# Patient Record
Sex: Female | Born: 1970 | Race: White | Hispanic: No | Smoking: Never smoker
Health system: Southern US, Community
[De-identification: ages and names within clinical notes are randomized; demographics above are authoritative.]

## PROBLEM LIST (undated history)

## (undated) DIAGNOSIS — M5136 Other intervertebral disc degeneration, lumbar region: Secondary | ICD-10-CM

## (undated) DIAGNOSIS — M48061 Spinal stenosis, lumbar region without neurogenic claudication: Secondary | ICD-10-CM

## (undated) DIAGNOSIS — F419 Anxiety disorder, unspecified: Secondary | ICD-10-CM

## (undated) DIAGNOSIS — K2270 Barrett's esophagus without dysplasia: Secondary | ICD-10-CM

## (undated) DIAGNOSIS — M51369 Other intervertebral disc degeneration, lumbar region without mention of lumbar back pain or lower extremity pain: Secondary | ICD-10-CM

## (undated) DIAGNOSIS — Z8489 Family history of other specified conditions: Secondary | ICD-10-CM

## (undated) DIAGNOSIS — I10 Essential (primary) hypertension: Secondary | ICD-10-CM

## (undated) DIAGNOSIS — J45909 Unspecified asthma, uncomplicated: Secondary | ICD-10-CM

## (undated) DIAGNOSIS — K219 Gastro-esophageal reflux disease without esophagitis: Secondary | ICD-10-CM

## (undated) HISTORY — DX: Barrett's esophagus without dysplasia: K22.70

## (undated) HISTORY — DX: Anxiety disorder, unspecified: F41.9

## (undated) HISTORY — DX: Unspecified asthma, uncomplicated: J45.909

## (undated) HISTORY — DX: Essential (primary) hypertension: I10

---
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
## 2005-07-14 ENCOUNTER — Other Ambulatory Visit: Payer: Self-pay

## 2005-07-14 ENCOUNTER — Emergency Department: Payer: Self-pay | Admitting: Emergency Medicine

## 2005-11-08 HISTORY — PX: DILATION AND CURETTAGE OF UTERUS: SHX78

## 2005-12-13 ENCOUNTER — Ambulatory Visit: Payer: Self-pay | Admitting: Obstetrics and Gynecology

## 2008-12-04 ENCOUNTER — Ambulatory Visit: Payer: Self-pay | Admitting: Internal Medicine

## 2009-03-18 ENCOUNTER — Ambulatory Visit: Payer: Self-pay | Admitting: Obstetrics and Gynecology

## 2009-08-22 ENCOUNTER — Ambulatory Visit: Payer: Self-pay | Admitting: Gastroenterology

## 2010-04-21 ENCOUNTER — Ambulatory Visit: Payer: Self-pay | Admitting: Obstetrics and Gynecology

## 2011-09-14 ENCOUNTER — Ambulatory Visit: Payer: Self-pay | Admitting: Obstetrics and Gynecology

## 2012-11-28 ENCOUNTER — Ambulatory Visit: Payer: Self-pay | Admitting: Obstetrics and Gynecology

## 2014-02-14 DIAGNOSIS — E785 Hyperlipidemia, unspecified: Secondary | ICD-10-CM | POA: Insufficient documentation

## 2014-02-14 DIAGNOSIS — K219 Gastro-esophageal reflux disease without esophagitis: Secondary | ICD-10-CM | POA: Insufficient documentation

## 2014-02-14 DIAGNOSIS — I10 Essential (primary) hypertension: Secondary | ICD-10-CM | POA: Insufficient documentation

## 2014-03-05 ENCOUNTER — Ambulatory Visit: Payer: Self-pay | Admitting: Obstetrics and Gynecology

## 2015-06-05 DIAGNOSIS — M6530 Trigger finger, unspecified finger: Secondary | ICD-10-CM | POA: Insufficient documentation

## 2015-06-05 DIAGNOSIS — M7710 Lateral epicondylitis, unspecified elbow: Secondary | ICD-10-CM | POA: Insufficient documentation

## 2016-05-25 ENCOUNTER — Other Ambulatory Visit: Payer: Self-pay | Admitting: Internal Medicine

## 2016-05-25 DIAGNOSIS — Z1231 Encounter for screening mammogram for malignant neoplasm of breast: Secondary | ICD-10-CM

## 2016-06-11 ENCOUNTER — Ambulatory Visit: Payer: Self-pay

## 2016-06-16 ENCOUNTER — Ambulatory Visit
Admission: RE | Admit: 2016-06-16 | Discharge: 2016-06-16 | Disposition: A | Discharge status: Home / Self Care | Payer: BLUE CROSS/BLUE SHIELD | Source: Ambulatory Visit | Attending: Internal Medicine | Admitting: Internal Medicine

## 2016-06-16 ENCOUNTER — Other Ambulatory Visit: Payer: Self-pay | Admitting: Internal Medicine

## 2016-06-16 DIAGNOSIS — Z1231 Encounter for screening mammogram for malignant neoplasm of breast: Secondary | ICD-10-CM

## 2016-06-16 DIAGNOSIS — R928 Other abnormal and inconclusive findings on diagnostic imaging of breast: Secondary | ICD-10-CM | POA: Diagnosis not present

## 2016-06-22 ENCOUNTER — Other Ambulatory Visit: Payer: Self-pay | Admitting: Internal Medicine

## 2016-06-22 DIAGNOSIS — N6320 Unspecified lump in the left breast, unspecified quadrant: Secondary | ICD-10-CM

## 2016-06-25 ENCOUNTER — Ambulatory Visit
Admission: RE | Admit: 2016-06-25 | Discharge: 2016-06-25 | Disposition: A | Discharge status: Home / Self Care | Payer: BLUE CROSS/BLUE SHIELD | Source: Ambulatory Visit | Attending: Internal Medicine | Admitting: Internal Medicine

## 2016-06-25 DIAGNOSIS — N6320 Unspecified lump in the left breast, unspecified quadrant: Secondary | ICD-10-CM

## 2016-06-25 DIAGNOSIS — N63 Unspecified lump in breast: Secondary | ICD-10-CM | POA: Insufficient documentation

## 2016-09-23 ENCOUNTER — Ambulatory Visit
Admission: RE | Admit: 2016-09-23 | Payer: BLUE CROSS/BLUE SHIELD | Source: Ambulatory Visit | Admitting: Gastroenterology

## 2016-09-23 ENCOUNTER — Encounter: Admission: RE | Payer: Self-pay | Source: Ambulatory Visit

## 2016-09-23 SURGERY — ESOPHAGOGASTRODUODENOSCOPY (EGD) WITH PROPOFOL
Anesthesia: General

## 2016-12-15 ENCOUNTER — Other Ambulatory Visit: Payer: Self-pay | Admitting: Internal Medicine

## 2016-12-15 DIAGNOSIS — N63 Unspecified lump in unspecified breast: Secondary | ICD-10-CM

## 2017-01-04 ENCOUNTER — Ambulatory Visit
Admission: RE | Admit: 2017-01-04 | Discharge: 2017-01-04 | Disposition: A | Discharge status: Home / Self Care | Payer: Commercial Managed Care - PPO | Source: Ambulatory Visit | Attending: Internal Medicine | Admitting: Internal Medicine

## 2017-01-04 DIAGNOSIS — N6320 Unspecified lump in the left breast, unspecified quadrant: Secondary | ICD-10-CM | POA: Insufficient documentation

## 2017-01-04 DIAGNOSIS — N63 Unspecified lump in unspecified breast: Secondary | ICD-10-CM

## 2017-01-11 ENCOUNTER — Ambulatory Visit: Payer: BLUE CROSS/BLUE SHIELD

## 2017-08-02 ENCOUNTER — Other Ambulatory Visit: Payer: Self-pay | Admitting: Nurse Practitioner

## 2017-08-02 DIAGNOSIS — N63 Unspecified lump in unspecified breast: Secondary | ICD-10-CM

## 2017-09-02 ENCOUNTER — Ambulatory Visit
Admission: RE | Admit: 2017-09-02 | Discharge: 2017-09-02 | Disposition: A | Discharge status: Home / Self Care | Payer: Commercial Managed Care - PPO | Source: Ambulatory Visit | Attending: Nurse Practitioner | Admitting: Nurse Practitioner

## 2017-09-02 DIAGNOSIS — N63 Unspecified lump in unspecified breast: Secondary | ICD-10-CM

## 2017-09-02 DIAGNOSIS — N6002 Solitary cyst of left breast: Secondary | ICD-10-CM | POA: Insufficient documentation

## 2017-09-02 DIAGNOSIS — N6320 Unspecified lump in the left breast, unspecified quadrant: Secondary | ICD-10-CM | POA: Diagnosis present

## 2017-10-24 ENCOUNTER — Other Ambulatory Visit: Payer: Self-pay

## 2017-10-26 ENCOUNTER — Other Ambulatory Visit: Payer: Self-pay | Admitting: Nurse Practitioner

## 2017-10-26 MED ORDER — NORETHINDRONE 0.35 MG PO TABS: 1.0000 | ORAL_TABLET | Freq: Every day | ORAL | 3 refills | Status: DC

## 2017-12-12 ENCOUNTER — Ambulatory Visit: Payer: Self-pay | Admitting: Nurse Practitioner

## 2017-12-28 ENCOUNTER — Encounter: Payer: Self-pay | Admitting: Internal Medicine

## 2017-12-28 ENCOUNTER — Ambulatory Visit: Payer: Commercial Managed Care - PPO | Admitting: Internal Medicine

## 2017-12-28 VITALS — BP 168/92 | HR 97 | Resp 16 | Ht 66.0 in | Wt 160.6 lb

## 2017-12-28 DIAGNOSIS — Z0001 Encounter for general adult medical examination with abnormal findings: Secondary | ICD-10-CM | POA: Diagnosis not present

## 2017-12-28 DIAGNOSIS — G4700 Insomnia, unspecified: Secondary | ICD-10-CM

## 2017-12-28 DIAGNOSIS — F439 Reaction to severe stress, unspecified: Secondary | ICD-10-CM

## 2017-12-28 DIAGNOSIS — I10 Essential (primary) hypertension: Secondary | ICD-10-CM

## 2017-12-28 MED ORDER — ALPRAZOLAM 0.25 MG PO TABS: 0.2500 mg | ORAL_TABLET | Freq: Two times a day (BID) | ORAL | 2 refills | Status: DC | PRN

## 2017-12-28 NOTE — Progress Notes (Signed)
[facility] [address] [place], [place] [address]  Internal MEDICINE  Office Visit Note  Patient Name: Pam Khan  [id number]  [id number]  Date of Service: 01/16/2018  Chief Complaint  Patient presents with  . Follow-up    6 month medications    HPI  Pt is here for routine follow up. Pt is here emotional and crying. She is getting separation from her husband. BP is elevated    Current Medication: Outpatient Encounter Medications as of 12/28/2017  Medication Sig  . diltiazem (CARDIZEM CD) 120 MG 24 hr capsule TAKE ONE CAPSULE BY MOUTH AT BEDTIME FOR HEART RATE  . famciclovir (FAMVIR) 500 MG tablet famciclovir 500 mg tablet  . fluconazole (DIFLUCAN) 150 MG tablet fluconazole 150 mg tablet  . norethindrone (ERRIN) 0.35 MG tablet Take 1 tablet (0.35 mg total) by mouth daily.  Marland Kitchen omeprazole (PRILOSEC) 40 MG capsule omeprazole 40 mg capsule,delayed release  . triamterene-hydrochlorothiazide (MAXZIDE-25) 37.5-25 MG tablet triamterene 37.5 mg-hydrochlorothiazide 25 mg tablet  . ALPRAZolam (XANAX) 0.25 MG tablet Take 1 tablet (0.25 mg total) by mouth 2 (two) times daily as needed for anxiety.  . [DISCONTINUED] norethindrone (MICRONOR,CAMILA,ERRIN) 0.35 MG tablet TAKE ONE TABLET BY MOUTH ONE TIME DAILY   No facility-administered encounter medications on file as of 12/28/2017.     Surgical History: Past Surgical History:  Procedure Laterality Date  . BACK SURGERY  1985  . CESAREAN SECTION  1989  . COSMETIC SURGERY  1985   head  . DILATION AND CURETTAGE OF UTERUS  2007    Medical History: Past Medical History:  Diagnosis Date  . Anxiety   . Asthma   . Barrett esophagus   . Hypertension     Family History: Family History  Problem Relation Age of Onset  . Breast cancer Mother 59  . Breast cancer Maternal Grandmother 60  . Diabetes Father   . Heart attack Father     Social History   Socioeconomic History  . Marital status: Married    Spouse name:  Not on file  . Number of children: Not on file  . Years of education: Not on file  . Highest education level: Not on file  Social Needs  . Financial resource strain: Not on file  . Food insecurity - worry: Not on file  . Food insecurity - inability: Not on file  . Transportation needs - medical: Not on file  . Transportation needs - non-medical: Not on file  Occupational History  . Not on file  Tobacco Use  . Smoking status: Never Smoker  . Smokeless tobacco: Never Used  Substance and Sexual Activity  . Alcohol use: No    Frequency: Never  . Drug use: No  . Sexual activity: Not on file  Other Topics Concern  . Not on file  Social History Narrative  . Not on file    Review of Systems  Constitutional: Negative for chills, fatigue and unexpected weight change.  HENT: Positive for postnasal drip. Negative for congestion, rhinorrhea, sneezing and sore throat.   Eyes: Negative for redness.  Respiratory: Negative for cough, chest tightness and shortness of breath.   Cardiovascular: Negative for chest pain and palpitations.  Gastrointestinal: Negative for abdominal pain, constipation, diarrhea, nausea and vomiting.  Genitourinary: Negative for dysuria and frequency.  Musculoskeletal: Negative for arthralgias, back pain, joint swelling and neck pain.  Skin: Negative for rash.  Neurological: Negative.  Negative for tremors and numbness.  Hematological: Negative for adenopathy. Does not  bruise/bleed easily.  Psychiatric/Behavioral: Negative for behavioral problems (Depression), sleep disturbance and suicidal ideas. The patient is not nervous/anxious.    Vital Signs: BP (!) 168/92 (BP Location: Left Arm, Patient Position: Sitting)   Pulse 97   Resp 16   Ht 5\' 6"  (1.676 m)   Wt 160 lb 9.6 oz (72.8 kg)   SpO2 98%   BMI 25.92 kg/m   Physical Exam  Constitutional: She is oriented to person, place, and time. She appears well-developed and well-nourished. No distress.  HENT:  Head:  Normocephalic and atraumatic.  Mouth/Throat: Oropharynx is clear and moist. No oropharyngeal exudate.  Eyes: EOM are normal. Pupils are equal, round, and reactive to light.  Neck: Normal range of motion. Neck supple. No tracheal deviation present.  Cardiovascular: Normal rate, regular rhythm and normal heart sounds. Exam reveals no gallop.  No murmur heard. Pulmonary/Chest: Effort normal. She has no rales. She exhibits no tenderness.  Abdominal: Soft. Bowel sounds are normal.  Musculoskeletal: Normal range of motion.  Lymphadenopathy:    She has cervical adenopathy.  Neurological: She is alert and oriented to person, place, and time.  Skin: Skin is warm and dry. She is not diaphoretic.  Psychiatric: Her behavior is normal. Judgment and thought content normal.  Anxious Gayla Medicus/stressed     Assessment/Plan: 1. Hypertension, unspecified type Monitor at home, continue medications  2. Stress at home - Emotional support is provided   3. Insomnia, unspecified type - Prn use of Xanax  General Counseling: Pam SpryGail verbalizes understanding of the findings of todays visit and agrees with plan of treatment. I have discussed any further diagnostic evaluation that may be needed or ordered today. We also reviewed her medications today. she has been encouraged to call the office with any questions or concerns that should arise related to todays visit.    Meds ordered this encounter  Medications  . ALPRAZolam (XANAX) 0.25 MG tablet    Sig: Take 1 tablet (0.25 mg total) by mouth 2 (two) times daily as needed for anxiety.    Dispense:  60 tablet    Refill:  2    Time spent:25 Minutes   Dr Lyndon CodeFozia M Khan Internal medicine

## 2018-01-05 ENCOUNTER — Other Ambulatory Visit: Payer: Self-pay | Admitting: Internal Medicine

## 2018-02-06 ENCOUNTER — Other Ambulatory Visit: Payer: Self-pay | Admitting: Internal Medicine

## 2018-04-05 ENCOUNTER — Other Ambulatory Visit: Payer: Self-pay | Admitting: Internal Medicine

## 2018-05-23 ENCOUNTER — Other Ambulatory Visit: Payer: Self-pay

## 2018-05-23 MED ORDER — NORETHINDRONE 0.35 MG PO TABS: 1.0000 | ORAL_TABLET | Freq: Every day | ORAL | 3 refills | Status: DC

## 2018-06-28 ENCOUNTER — Other Ambulatory Visit: Payer: Self-pay | Admitting: Adult Health

## 2018-06-28 ENCOUNTER — Encounter: Payer: Self-pay | Admitting: Adult Health

## 2018-06-28 ENCOUNTER — Ambulatory Visit: Payer: Commercial Managed Care - PPO | Admitting: Adult Health

## 2018-06-28 VITALS — BP 146/76 | HR 90 | Resp 16 | Ht 66.0 in | Wt 163.0 lb

## 2018-06-28 DIAGNOSIS — Z124 Encounter for screening for malignant neoplasm of cervix: Secondary | ICD-10-CM | POA: Diagnosis not present

## 2018-06-28 DIAGNOSIS — Z0001 Encounter for general adult medical examination with abnormal findings: Secondary | ICD-10-CM | POA: Diagnosis not present

## 2018-06-28 DIAGNOSIS — N760 Acute vaginitis: Secondary | ICD-10-CM

## 2018-06-28 DIAGNOSIS — K219 Gastro-esophageal reflux disease without esophagitis: Secondary | ICD-10-CM

## 2018-06-28 DIAGNOSIS — F439 Reaction to severe stress, unspecified: Secondary | ICD-10-CM

## 2018-06-28 DIAGNOSIS — R300 Dysuria: Secondary | ICD-10-CM

## 2018-06-28 DIAGNOSIS — I10 Essential (primary) hypertension: Secondary | ICD-10-CM | POA: Diagnosis not present

## 2018-06-28 MED ORDER — ALPRAZOLAM 0.25 MG PO TABS: 0.2500 mg | ORAL_TABLET | Freq: Two times a day (BID) | ORAL | 1 refills | Status: DC | PRN

## 2018-06-28 NOTE — Progress Notes (Signed)
[facility] [address] [place], [place] [address]  Internal MEDICINE  Office Visit Note  Patient Name: Pam Khan  [id number]  [id number]  Date of Service: 07/16/2018  Chief Complaint  Patient presents with  . Annual Exam  . Hypertension  . Insomnia     HPI Pt is here for routine health maintenance examination.  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 She continues to be dealing with a bad divorce.  She denies depression, but reports increased stress with this lingering divorce.  She is generally healthy, denies complaints.  She is eating well  Current Medication: Outpatient Encounter Medications as of 06/28/2018  Medication Sig  . ALPRAZolam (XANAX) 0.25 MG tablet Take 1 tablet (0.25 mg total) by mouth 2 (two) times daily as needed for anxiety.  Marland Kitchen diltiazem (CARDIZEM CD) 120 MG 24 hr capsule TAKE ONE CAPSULE BY MOUTH AT BEDTIME FOR HEART RATE  . famciclovir (FAMVIR) 500 MG tablet famciclovir 500 mg tablet  . fluconazole (DIFLUCAN) 150 MG tablet fluconazole 150 mg tablet  . norethindrone (MICRONOR,CAMILA,ERRIN) 0.35 MG tablet Take 1 tablet (0.35 mg total) by mouth daily.  Marland Kitchen omeprazole (PRILOSEC) 40 MG capsule omeprazole 40 mg capsule,delayed release  . triamterene-hydrochlorothiazide (MAXZIDE-25) 37.5-25 MG tablet triamterene 37.5 mg-hydrochlorothiazide 25 mg tablet  . [DISCONTINUED] ALPRAZolam (XANAX) 0.25 MG tablet Take 1 tablet (0.25 mg total) by mouth 2 (two) times daily as needed for anxiety.   No facility-administered encounter medications on file as of 06/28/2018.     Surgical History: Past Surgical History:  Procedure Laterality Date  . BACK SURGERY  1985  . CESAREAN SECTION  1989  . COSMETIC SURGERY  1985   head  . DILATION AND CURETTAGE OF UTERUS  2007    Medical History: Past Medical History:  Diagnosis Date  . Anxiety   . Asthma   . Barrett esophagus   . Hypertension     Family  History: Family History  Problem Relation Age of Onset  . Breast cancer Mother 11  . Breast cancer Maternal Grandmother 60  . Diabetes Father   . Heart attack Father       Review of Systems  Constitutional: Negative for chills, fatigue and unexpected weight change.  HENT: Negative for congestion, rhinorrhea, sneezing and sore throat.   Eyes: Negative for photophobia, pain and redness.  Respiratory: Negative for cough, chest tightness and shortness of breath.   Cardiovascular: Negative for chest pain and palpitations.  Gastrointestinal: Negative for abdominal pain, constipation, diarrhea, nausea and vomiting.  Endocrine: Negative.   Genitourinary: Negative for dysuria and frequency.  Musculoskeletal: Negative for arthralgias, back pain, joint swelling and neck pain.  Skin: Negative for rash.  Allergic/Immunologic: Negative.   Neurological: Negative for tremors and numbness.  Hematological: Negative for adenopathy. Does not bruise/bleed easily.  Psychiatric/Behavioral: Negative for behavioral problems and sleep disturbance. The patient is not nervous/anxious.      Vital Signs: BP (!) 146/76   Pulse 90   Resp 16   Ht 5\' 6"  (1.676 m)   Wt 163 lb (73.9 kg)   SpO2 98%   BMI 26.31 kg/m    Physical Exam  Constitutional: She is oriented to person, place, and time. She appears well-developed and well-nourished. No distress.  HENT:  Head: Normocephalic and atraumatic.  Mouth/Throat: Oropharynx is clear and moist. No oropharyngeal exudate.  Eyes: Pupils are equal, round, and reactive to light. EOM are normal.  Neck: Normal  range of motion. Neck supple. No JVD present. No tracheal deviation present. No thyromegaly present.  Cardiovascular: Normal rate, regular rhythm and normal heart sounds. Exam reveals no gallop and no friction rub.  No murmur heard. Pulmonary/Chest: Effort normal and breath sounds normal. No respiratory distress. She has no wheezes. She has no rales. She  exhibits no tenderness. Right breast exhibits no inverted nipple, no mass, no nipple discharge, no skin change and no tenderness. Left breast exhibits no inverted nipple, no mass, no nipple discharge, no skin change and no tenderness. No breast bleeding. Breasts are symmetrical.  Abdominal: Soft. There is no tenderness. There is no guarding.  Genitourinary: Uterus normal. Rectal exam shows no external hemorrhoid, no internal hemorrhoid and no mass. There is no rash, tenderness, lesion or injury on the right labia. There is no rash, tenderness, lesion or injury on the left labia. Cervix exhibits no discharge. No erythema, tenderness or bleeding in the vagina. No foreign body in the vagina. No signs of injury around the vagina. No vaginal discharge found.  Musculoskeletal: Normal range of motion.  Lymphadenopathy:    She has no cervical adenopathy.  Neurological: She is alert and oriented to person, place, and time. No cranial nerve deficit.  Skin: Skin is warm and dry. She is not diaphoretic.  Psychiatric: She has a normal mood and affect. Her behavior is normal. Judgment and thought content normal.  Nursing note and vitals reviewed.    LABS: Recent Results (from the past 2160 hour(s))  NuSwab Vaginitis Plus (VG+)     Status: Abnormal   Collection Time: 06/28/18 12:00 AM  Result Value Ref Range   Atopobium vaginae High - 2 (A) Score   BVAB 2 High - 2 (A) Score   Megasphaera 1 Low - 0 Score    Comment: Calculate total score by adding the 3 individual bacterial vaginosis (BV) marker scores together.  Total score is interpreted as follows: Total score 0-1: Indicates the absence of BV. Total score   2: Indeterminate for BV. Additional clinical                  data should be evaluated to establish a                  diagnosis. Total score 3-6: Indicates the presence of BV. This test was developed and its performance characteristics determined by [facility].  It has not been cleared or  approved by the Food and Drug Administration.  The FDA has determined that such clearance or approval is not necessary.    Candida albicans, NAA Positive (A) Negative   Candida glabrata, NAA Negative Negative    Comment: This test was developed and its performance characteristics determined by [facility].  It has not been cleared or approved by the Food and Drug Administration.  The FDA has determined that such clearance or approval is not necessary.    Trich vag by NAA Negative Negative   Chlamydia trachomatis, NAA Negative Negative   Neisseria gonorrhoeae, NAA Negative Negative  Specimen status report     Status: None   Collection Time: 06/28/18 12:00 AM  Result Value Ref Range   specimen status report Comment     Comment: Please note Please note The date and/or time of collection was not indicated on the requisition as required by state and federal law.  The date of receipt of the specimen was used as the collection date if not supplied.   UA/M w/rflx Culture, Routine  Status: Abnormal   Collection Time: 06/28/18 12:00 AM  Result Value Ref Range   Specific Gravity, UA 1.023 1.005 - 1.030   pH, UA 6.0 5.0 - 7.5   Color, UA Yellow Yellow   Appearance Ur Clear Clear   Leukocytes, UA Negative Negative   Protein, UA Negative Negative/Trace   Glucose, UA Negative Negative   Ketones, UA Negative Negative   RBC, UA Negative Negative   Bilirubin, UA Negative Negative   Urobilinogen, Ur 0.2 0.2 - 1.0 mg/dL   Nitrite, UA Positive (A) Negative   Microscopic Examination See below:     Comment: Microscopic was indicated and was performed.   Urinalysis Reflex Comment     Comment: This specimen has reflexed to a Urine Culture.  Microscopic Examination     Status: Abnormal   Collection Time: 06/28/18 12:00 AM  Result Value Ref Range   WBC, UA 0-5 0 - 5 /hpf   RBC, UA 0-2 0 - 2 /hpf   Epithelial Cells (non renal) 0-10 0 - 10 /hpf   Casts None seen None seen /lpf   Mucus, UA  Present Not Estab.   Bacteria, UA Moderate (A) None seen/Few  Urine Culture, Reflex     Status: Abnormal   Collection Time: 06/28/18 12:00 AM  Result Value Ref Range   Urine Culture, Routine Final report (A)    Organism ID, Bacteria Escherichia coli (A)     Comment: Greater than 100,000 colony forming units per mL Cefazolin <=4 ug/mL Cefazolin with an MIC <=16 predicts susceptibility to the oral agents cefaclor, cefdinir, cefpodoxime, cefprozil, cefuroxime, cephalexin, and loracarbef when used for therapy of uncomplicated urinary tract infections due to E. coli, Klebsiella pneumoniae, and Proteus mirabilis.    Antimicrobial Susceptibility Comment     Comment:       ** S = Susceptible; I = Intermediate; R = Resistant **                    P = Positive; N = Negative             MICS are expressed in micrograms per mL    Antibiotic                 RSLT#1    RSLT#2    RSLT#3    RSLT#4 Amoxicillin/Clavulanic Acid    S Ampicillin                     S Cefepime                       S Ceftriaxone                    S Cefuroxime                     S Ciprofloxacin                  S Ertapenem                      S Gentamicin                     S Imipenem                       S Levofloxacin  S Meropenem                      S Nitrofurantoin                 S Piperacillin/Tazobactam        S Tetracycline                   S Tobramycin                     S Trimethoprim/Sulfa             S   Pap IG and HPV (high risk) DNA detection     Status: None   Collection Time: 06/28/18 12:00 AM  Result Value Ref Range   DIAGNOSIS: Comment     Comment: NEGATIVE FOR INTRAEPITHELIAL LESION OR MALIGNANCY. CELLULAR CHANGES ASSOCIATED WITH INFLAMMATION ARE PRESENT. FUNGAL ORGANISMS MORPHOLOGICALLY CONSISTENT WITH CANDIDA SPECIES ARE PRESENT. THIS SPECIMEN WAS RESCREENED AS PART OF OUR QUALITY CONTROL PROGRAM.    Specimen adequacy: Comment     Comment: Satisfactory for  evaluation. Endocervical and/or squamous metaplastic cells (endocervical component) are present.    Clinician Provided ICD10 Comment     Comment: Z12.4   Performed by: Comment     Comment: Frances MaywoodPang Yang, Cytotechnologist (ASCP)   QC reviewed by: Comment     Comment: Camie PatienceAngela Scott, Supervisory Cytotechnologist (ASCP)   PAP Smear Comment .    Note: Comment     Comment: The Pap smear is a screening test designed to aid in the detection of premalignant and malignant conditions of the uterine cervix.  It is not a diagnostic procedure and should not be used as the sole means of detecting cervical cancer.  Both false-positive and false-negative reports do occur.    Test Methodology Comment     Comment: This liquid based ThinPrep(R) pap test was screened with the use of an image guided system.    HPV, high-risk Negative Negative    Comment: This high-risk HPV test detects thirteen high-risk types (16/18/31/33/35/39/45/51/52/56/58/59/68) without differentiation.      Assessment/Plan: 1. Encounter for general adult medical examination with abnormal findings Labs ordered. Will treat as indicated.   2. Routine cervical smear - Pap IG and HPV (high risk) DNA detection  3. Acute vaginitis - NuSwab Vaginitis Plus (VG+)  4. Hypertension, unspecified type Stable, continue current treatment.   5. Gastroesophageal reflux disease without esophagitis Continue medication as directed.   6. Stress at home Discussed coping mechanisms with patient.  Xanax RX refilled at this time.  7. Dysuria - UA/M w/rflx Culture, Routine  General Counseling: Dondra SpryGail verbalizes understanding of the findings of todays visit and agrees with plan of treatment. I have discussed any further diagnostic evaluation that may be needed or ordered today. We also reviewed her medications today. she has been encouraged to call the office with any questions or concerns that should arise related to todays visit.   Orders Placed  This Encounter  Procedures  . UA/M w/rflx Culture, Routine  . NuSwab Vaginitis Plus (VG+)  . CBC with Differential/Platelet  . Lipid Panel With LDL/HDL Ratio  . TSH  . T4, free  . Comprehensive metabolic panel  . Specimen status report    Meds ordered this encounter  Medications  . ALPRAZolam (XANAX) 0.25 MG tablet    Sig: Take 1 tablet (0.25 mg total) by mouth 2 (two) times daily as needed for anxiety.    Dispense:  60 tablet    Refill:  1    Time spent: 35 Minutes   This patient was seen by Blima Ledger AGNP-C in Collaboration with Dr Lyndon Code as a part of collaborative care agreement   Lyndon Code, MD  Internal Medicine

## 2018-06-28 NOTE — Patient Instructions (Signed)
Human Papillomavirus Human papillomavirus (HPV) is the most common sexually transmitted infection (STI). It is easy to pass it from person to person (contagious). HPV can cause cervical cancer, anal cancer, and genital warts. The genital warts can be seen and felt. Also, there may be wartlike regions in the throat. HPV may not have any symptoms. It is possible to have HPV for a long time and not know it. You may pass HPV on to others without knowing it. Follow these instructions at home:  Take medicines as told by your doctor.  Use over-the-counter creams for itching as told by your doctor.  Keep all follow-up visits. Make sure to get Pap tests as told by your doctor.  Do not touch or scratch the warts.  Do not treat genital warts with medicines used for treating hand warts.  Do not have sex while you are getting treatment.  Do not douche or use tampons during treatment of HPV.  Tell your sex partner about your infection because he or she may also need treatment.  If you get pregnant, tell your doctor that you had HPV. Your doctor will watch your pregnancy closely. This is important to keep your baby safe.  After treatment, use condoms during sex to prevent future infections.  Have only one sex partner.  Have a sex partner who does not have other sex partners. Contact a doctor if:  The treated skin is red, swollen, or painful.  You have a fever.  You feel ill.  You feel lumps or pimple-like areas in and around your genital area.  You have bleeding of the vagina or the area that was treated.  You have pain during sex. This information is not intended to replace advice given to you by your health care provider. Make sure you discuss any questions you have with your health care provider. Document Released: 10/07/2008 Document Revised: 04/01/2016 Document Reviewed: 01/30/2014 Elsevier Interactive Patient Education  2017 [employer]  

## 2018-07-01 LAB — NUSWAB VAGINITIS PLUS (VG+)
ATOPOBIUM VAGINAE: HIGH {score} — AB
BVAB 2: HIGH Score — AB
CANDIDA GLABRATA, NAA: NEGATIVE
Candida albicans, NAA: POSITIVE — AB
Chlamydia trachomatis, NAA: NEGATIVE
Neisseria gonorrhoeae, NAA: NEGATIVE
Trich vag by NAA: NEGATIVE

## 2018-07-01 LAB — SPECIMEN STATUS REPORT

## 2018-07-03 ENCOUNTER — Telehealth: Payer: Self-pay

## 2018-07-03 ENCOUNTER — Other Ambulatory Visit: Payer: Self-pay | Admitting: Adult Health

## 2018-07-03 LAB — UA/M W/RFLX CULTURE, ROUTINE
BILIRUBIN UA: NEGATIVE
GLUCOSE, UA: NEGATIVE
KETONES UA: NEGATIVE
LEUKOCYTES UA: NEGATIVE
Nitrite, UA: POSITIVE — AB
Protein, UA: NEGATIVE
RBC, UA: NEGATIVE
SPEC GRAV UA: 1.023 (ref 1.005–1.030)
Urobilinogen, Ur: 0.2 mg/dL (ref 0.2–1.0)
pH, UA: 6 (ref 5.0–7.5)

## 2018-07-03 LAB — URINE CULTURE, REFLEX

## 2018-07-03 LAB — MICROSCOPIC EXAMINATION: Casts: NONE SEEN /lpf

## 2018-07-03 MED ORDER — METRONIDAZOLE 500 MG PO TABS: 500.0000 mg | ORAL_TABLET | Freq: Two times a day (BID) | ORAL | 0 refills | Status: DC

## 2018-07-03 NOTE — Progress Notes (Signed)
Flagyl 500mg  PO BID #14, no refills sent to pharmacy for BV lab results

## 2018-07-05 LAB — PAP IG AND HPV HIGH-RISK
HPV, HIGH-RISK: NEGATIVE
PAP SMEAR COMMENT: 0

## 2018-07-05 NOTE — Telephone Encounter (Signed)
Informed pt of results.

## 2018-07-05 NOTE — Telephone Encounter (Signed)
-----   Message from Johnna AcostaAdam J Scarboro, NP sent at 07/05/2018  9:43 AM EDT ----- Regarding: HPV [name] HPv test just came back today.  It was NEGATIVE for HPV, or any cervical changes.

## 2018-08-18 ENCOUNTER — Other Ambulatory Visit: Payer: Self-pay | Admitting: Internal Medicine

## 2018-08-27 ENCOUNTER — Other Ambulatory Visit: Payer: Self-pay | Admitting: Internal Medicine

## 2018-08-28 ENCOUNTER — Other Ambulatory Visit: Payer: Self-pay

## 2018-09-23 ENCOUNTER — Other Ambulatory Visit: Payer: Self-pay | Admitting: Internal Medicine

## 2018-10-13 ENCOUNTER — Other Ambulatory Visit: Payer: Self-pay | Admitting: Internal Medicine

## 2018-10-13 DIAGNOSIS — Z1231 Encounter for screening mammogram for malignant neoplasm of breast: Secondary | ICD-10-CM

## 2018-11-16 ENCOUNTER — Ambulatory Visit
Admission: RE | Admit: 2018-11-16 | Discharge: 2018-11-16 | Disposition: A | Discharge status: Home / Self Care | Payer: Commercial Managed Care - PPO | Source: Ambulatory Visit | Attending: Internal Medicine | Admitting: Internal Medicine

## 2018-11-16 ENCOUNTER — Other Ambulatory Visit: Payer: Self-pay | Admitting: Internal Medicine

## 2018-11-16 DIAGNOSIS — N6310 Unspecified lump in the right breast, unspecified quadrant: Secondary | ICD-10-CM

## 2018-11-16 DIAGNOSIS — Z1231 Encounter for screening mammogram for malignant neoplasm of breast: Secondary | ICD-10-CM | POA: Diagnosis present

## 2018-11-16 DIAGNOSIS — R928 Other abnormal and inconclusive findings on diagnostic imaging of breast: Secondary | ICD-10-CM

## 2018-11-22 ENCOUNTER — Ambulatory Visit
Admission: RE | Admit: 2018-11-22 | Discharge: 2018-11-22 | Disposition: A | Discharge status: Home / Self Care | Payer: Commercial Managed Care - PPO | Source: Ambulatory Visit | Attending: Internal Medicine | Admitting: Internal Medicine

## 2018-11-22 DIAGNOSIS — R928 Other abnormal and inconclusive findings on diagnostic imaging of breast: Secondary | ICD-10-CM

## 2018-11-22 DIAGNOSIS — N6310 Unspecified lump in the right breast, unspecified quadrant: Secondary | ICD-10-CM

## 2018-11-23 ENCOUNTER — Other Ambulatory Visit: Payer: Self-pay | Admitting: Internal Medicine

## 2018-11-23 DIAGNOSIS — R928 Other abnormal and inconclusive findings on diagnostic imaging of breast: Secondary | ICD-10-CM

## 2018-11-23 DIAGNOSIS — N6001 Solitary cyst of right breast: Secondary | ICD-10-CM

## 2018-12-25 ENCOUNTER — Telehealth: Payer: Self-pay

## 2018-12-25 ENCOUNTER — Other Ambulatory Visit: Payer: Self-pay | Admitting: Adult Health

## 2018-12-25 NOTE — Telephone Encounter (Signed)
Spoke with pt need appt for med refills for xanax

## 2019-01-30 ENCOUNTER — Other Ambulatory Visit: Payer: Self-pay | Admitting: Adult Health

## 2019-03-19 ENCOUNTER — Other Ambulatory Visit: Payer: Self-pay | Admitting: Internal Medicine

## 2019-05-17 ENCOUNTER — Observation Stay
Admission: EM | Admit: 2019-05-17 | Discharge: 2019-05-18 | Disposition: A | Discharge status: Home / Self Care | Payer: Commercial Managed Care - PPO | Attending: Internal Medicine | Admitting: Internal Medicine

## 2019-05-17 ENCOUNTER — Inpatient Hospital Stay: Payer: Commercial Managed Care - PPO | Admitting: Anesthesiology

## 2019-05-17 ENCOUNTER — Encounter
Admission: EM | Disposition: A | Discharge status: Home / Self Care | Payer: Self-pay | Source: Home / Self Care | Attending: Emergency Medicine

## 2019-05-17 ENCOUNTER — Other Ambulatory Visit: Payer: Self-pay

## 2019-05-17 ENCOUNTER — Encounter: Payer: Self-pay | Admitting: Emergency Medicine

## 2019-05-17 ENCOUNTER — Emergency Department: Payer: Commercial Managed Care - PPO

## 2019-05-17 DIAGNOSIS — M1990 Unspecified osteoarthritis, unspecified site: Secondary | ICD-10-CM | POA: Insufficient documentation

## 2019-05-17 DIAGNOSIS — A419 Sepsis, unspecified organism: Secondary | ICD-10-CM | POA: Diagnosis not present

## 2019-05-17 DIAGNOSIS — I10 Essential (primary) hypertension: Secondary | ICD-10-CM | POA: Diagnosis not present

## 2019-05-17 DIAGNOSIS — Z793 Long term (current) use of hormonal contraceptives: Secondary | ICD-10-CM | POA: Insufficient documentation

## 2019-05-17 DIAGNOSIS — N12 Tubulo-interstitial nephritis, not specified as acute or chronic: Secondary | ICD-10-CM

## 2019-05-17 DIAGNOSIS — N23 Unspecified renal colic: Secondary | ICD-10-CM | POA: Diagnosis present

## 2019-05-17 DIAGNOSIS — J45909 Unspecified asthma, uncomplicated: Secondary | ICD-10-CM | POA: Diagnosis not present

## 2019-05-17 DIAGNOSIS — Z1159 Encounter for screening for other viral diseases: Secondary | ICD-10-CM | POA: Diagnosis not present

## 2019-05-17 DIAGNOSIS — Z79899 Other long term (current) drug therapy: Secondary | ICD-10-CM | POA: Diagnosis not present

## 2019-05-17 DIAGNOSIS — N136 Pyonephrosis: Secondary | ICD-10-CM | POA: Diagnosis not present

## 2019-05-17 DIAGNOSIS — N132 Hydronephrosis with renal and ureteral calculous obstruction: Secondary | ICD-10-CM

## 2019-05-17 DIAGNOSIS — E860 Dehydration: Secondary | ICD-10-CM | POA: Insufficient documentation

## 2019-05-17 DIAGNOSIS — K219 Gastro-esophageal reflux disease without esophagitis: Secondary | ICD-10-CM | POA: Diagnosis not present

## 2019-05-17 DIAGNOSIS — F419 Anxiety disorder, unspecified: Secondary | ICD-10-CM | POA: Insufficient documentation

## 2019-05-17 DIAGNOSIS — N179 Acute kidney failure, unspecified: Secondary | ICD-10-CM | POA: Diagnosis not present

## 2019-05-17 HISTORY — PX: CYSTOSCOPY WITH STENT PLACEMENT: SHX5790

## 2019-05-17 LAB — CBC WITH DIFFERENTIAL/PLATELET
Abs Immature Granulocytes: 0 10*3/uL (ref 0.00–0.07)
Basophils Absolute: 0 10*3/uL (ref 0.0–0.1)
Basophils Relative: 0 %
Eosinophils Absolute: 0 10*3/uL (ref 0.0–0.5)
Eosinophils Relative: 0 %
HCT: 44 % (ref 36.0–46.0)
Hemoglobin: 15.1 g/dL — ABNORMAL HIGH (ref 12.0–15.0)
Immature Granulocytes: 0 %
Lymphocytes Relative: 5 %
Lymphs Abs: 0.2 10*3/uL — ABNORMAL LOW (ref 0.7–4.0)
MCH: 31.6 pg (ref 26.0–34.0)
MCHC: 34.3 g/dL (ref 30.0–36.0)
MCV: 92.1 fL (ref 80.0–100.0)
Monocytes Absolute: 0 10*3/uL — ABNORMAL LOW (ref 0.1–1.0)
Monocytes Relative: 1 %
Neutro Abs: 2.7 10*3/uL (ref 1.7–7.7)
Neutrophils Relative %: 94 %
Platelets: 242 10*3/uL (ref 150–400)
RBC: 4.78 MIL/uL (ref 3.87–5.11)
RDW: 12.3 % (ref 11.5–15.5)
WBC: 2.9 10*3/uL — ABNORMAL LOW (ref 4.0–10.5)
nRBC: 0 % (ref 0.0–0.2)

## 2019-05-17 LAB — URINALYSIS, COMPLETE (UACMP) WITH MICROSCOPIC
Bilirubin Urine: NEGATIVE
Glucose, UA: NEGATIVE mg/dL
Ketones, ur: NEGATIVE mg/dL
Nitrite: POSITIVE — AB
Protein, ur: 30 mg/dL — AB
Specific Gravity, Urine: 1.023 (ref 1.005–1.030)
WBC, UA: >50 WBC/hpf — ABNORMAL HIGH (ref 0–5)
pH: 6 (ref 5.0–8.0)

## 2019-05-17 LAB — COMPREHENSIVE METABOLIC PANEL
ALT: 47 U/L — ABNORMAL HIGH (ref 0–44)
AST: 40 U/L (ref 15–41)
Albumin: 4.4 g/dL (ref 3.5–5.0)
Alkaline Phosphatase: 100 U/L (ref 38–126)
Anion gap: 18 — ABNORMAL HIGH (ref 5–15)
BUN: 25 mg/dL — ABNORMAL HIGH (ref 6–20)
CO2: 16 mmol/L — ABNORMAL LOW (ref 22–32)
Calcium: 9.2 mg/dL (ref 8.9–10.3)
Chloride: 100 mmol/L (ref 98–111)
Creatinine, Ser: 1.43 mg/dL — ABNORMAL HIGH (ref 0.44–1.00)
GFR calc Af Amer: 49 mL/min — ABNORMAL LOW (ref >60)
GFR calc non Af Amer: 43 mL/min — ABNORMAL LOW (ref >60)
Glucose, Bld: 228 mg/dL — ABNORMAL HIGH (ref 70–99)
Potassium: 3.8 mmol/L (ref 3.5–5.1)
Sodium: 134 mmol/L — ABNORMAL LOW (ref 135–145)
Total Bilirubin: 1.9 mg/dL — ABNORMAL HIGH (ref 0.3–1.2)
Total Protein: 7.4 g/dL (ref 6.5–8.1)

## 2019-05-17 LAB — GLUCOSE, CAPILLARY
Glucose-Capillary: 138 mg/dL — ABNORMAL HIGH (ref 70–99)
Glucose-Capillary: 218 mg/dL — ABNORMAL HIGH (ref 70–99)

## 2019-05-17 LAB — LIPASE, BLOOD: Lipase: 35 U/L (ref 11–51)

## 2019-05-17 LAB — SARS CORONAVIRUS 2 BY RT PCR (HOSPITAL ORDER, PERFORMED IN ~~LOC~~ HOSPITAL LAB): SARS Coronavirus 2: NEGATIVE

## 2019-05-17 SURGERY — CYSTOSCOPY, WITH STENT INSERTION
Anesthesia: General | Site: Ureter | Laterality: Left

## 2019-05-17 MED ORDER — MORPHINE SULFATE (PF) 2 MG/ML IV SOLN: 2.0000 mg | INTRAVENOUS | Status: DC | PRN

## 2019-05-17 MED ORDER — ACETAMINOPHEN 325 MG PO TABS: 650.0000 mg | ORAL_TABLET | Freq: Four times a day (QID) | ORAL | Status: DC | PRN

## 2019-05-17 MED ORDER — OXYCODONE HCL 5 MG PO TABS: 5.0000 mg | ORAL_TABLET | ORAL | Status: DC | PRN

## 2019-05-17 MED ORDER — HYDROCODONE-ACETAMINOPHEN 7.5-325 MG PO TABS
1.0000 | ORAL_TABLET | Freq: Once | ORAL | Status: DC | PRN
  Filled 2019-05-17: qty 1

## 2019-05-17 MED ORDER — ROCURONIUM BROMIDE 100 MG/10ML IV SOLN
INTRAVENOUS | Status: DC | PRN
  Administered 2019-05-17: 10 mg via INTRAVENOUS
  Administered 2019-05-17: 20 mg via INTRAVENOUS

## 2019-05-17 MED ORDER — CIPROFLOXACIN IN D5W 200 MG/100ML IV SOLN: 200.0000 mg | Freq: Two times a day (BID) | INTRAVENOUS | Status: DC

## 2019-05-17 MED ORDER — PROMETHAZINE HCL 25 MG/ML IJ SOLN: 6.2500 mg | INTRAMUSCULAR | Status: DC | PRN

## 2019-05-17 MED ORDER — SODIUM CHLORIDE 0.9 % IV SOLN
2.0000 g | INTRAVENOUS | Status: DC
  Filled 2019-05-17: qty 20

## 2019-05-17 MED ORDER — ACETAMINOPHEN 160 MG/5ML PO SOLN
325.0000 mg | ORAL | Status: DC | PRN
  Filled 2019-05-17: qty 20.3

## 2019-05-17 MED ORDER — SODIUM CHLORIDE 0.9 % IV SOLN
2.0000 g | INTRAVENOUS | Status: DC
  Administered 2019-05-18: 2 g via INTRAVENOUS
  Filled 2019-05-17: qty 20

## 2019-05-17 MED ORDER — SODIUM CHLORIDE 0.9 % IV SOLN
Freq: Once | INTRAVENOUS | Status: AC
  Administered 2019-05-17: 08:00:00 via INTRAVENOUS

## 2019-05-17 MED ORDER — MORPHINE SULFATE (PF) 4 MG/ML IV SOLN
4.0000 mg | Freq: Once | INTRAVENOUS | Status: AC
  Administered 2019-05-17: 4 mg via INTRAVENOUS
  Filled 2019-05-17: qty 1

## 2019-05-17 MED ORDER — DILTIAZEM HCL ER COATED BEADS 180 MG PO CP24
180.0000 mg | ORAL_CAPSULE | Freq: Every day | ORAL | Status: DC
  Administered 2019-05-17 – 2019-05-18 (×2): 180 mg via ORAL
  Filled 2019-05-17 (×3): qty 1

## 2019-05-17 MED ORDER — SEVOFLURANE IN SOLN
RESPIRATORY_TRACT | Status: AC
  Filled 2019-05-17: qty 250

## 2019-05-17 MED ORDER — MIDAZOLAM HCL 2 MG/2ML IJ SOLN
INTRAMUSCULAR | Status: DC | PRN
  Administered 2019-05-17: 2 mg via INTRAVENOUS

## 2019-05-17 MED ORDER — DEXAMETHASONE SODIUM PHOSPHATE 10 MG/ML IJ SOLN
INTRAMUSCULAR | Status: DC | PRN
  Administered 2019-05-17: 10 mg via INTRAVENOUS

## 2019-05-17 MED ORDER — SODIUM CHLORIDE 0.9 % IV SOLN
INTRAVENOUS | Status: DC
  Administered 2019-05-17 – 2019-05-18 (×2): via INTRAVENOUS

## 2019-05-17 MED ORDER — ONDANSETRON HCL 4 MG PO TABS: 4.0000 mg | ORAL_TABLET | Freq: Four times a day (QID) | ORAL | Status: DC | PRN

## 2019-05-17 MED ORDER — ACETAMINOPHEN 650 MG RE SUPP: 650.0000 mg | Freq: Four times a day (QID) | RECTAL | Status: DC | PRN

## 2019-05-17 MED ORDER — FENTANYL CITRATE (PF) 100 MCG/2ML IJ SOLN: 25.0000 ug | INTRAMUSCULAR | Status: DC | PRN

## 2019-05-17 MED ORDER — SUGAMMADEX SODIUM 200 MG/2ML IV SOLN
INTRAVENOUS | Status: DC | PRN
  Administered 2019-05-17: 140 mg via INTRAVENOUS

## 2019-05-17 MED ORDER — FENTANYL CITRATE (PF) 100 MCG/2ML IJ SOLN
INTRAMUSCULAR | Status: AC
  Filled 2019-05-17: qty 2

## 2019-05-17 MED ORDER — PANTOPRAZOLE SODIUM 40 MG PO TBEC
40.0000 mg | DELAYED_RELEASE_TABLET | Freq: Every day | ORAL | Status: DC
  Administered 2019-05-18: 08:00:00 40 mg via ORAL
  Filled 2019-05-17: qty 1

## 2019-05-17 MED ORDER — ONDANSETRON HCL 4 MG/2ML IJ SOLN
4.0000 mg | Freq: Four times a day (QID) | INTRAMUSCULAR | Status: DC | PRN
  Administered 2019-05-17: 4 mg via INTRAVENOUS

## 2019-05-17 MED ORDER — ACETAMINOPHEN 325 MG PO TABS: 325.0000 mg | ORAL_TABLET | ORAL | Status: DC | PRN

## 2019-05-17 MED ORDER — PROPOFOL 10 MG/ML IV BOLUS
INTRAVENOUS | Status: DC | PRN
  Administered 2019-05-17: 150 mg via INTRAVENOUS

## 2019-05-17 MED ORDER — PROPOFOL 10 MG/ML IV BOLUS
INTRAVENOUS | Status: AC
  Filled 2019-05-17: qty 20

## 2019-05-17 MED ORDER — INSULIN ASPART 100 UNIT/ML ~~LOC~~ SOLN
0.0000 [IU] | Freq: Three times a day (TID) | SUBCUTANEOUS | Status: DC
  Administered 2019-05-17: 18:00:00 1 [IU] via SUBCUTANEOUS
  Administered 2019-05-18: 5 [IU] via SUBCUTANEOUS
  Administered 2019-05-18: 08:00:00 1 [IU] via SUBCUTANEOUS
  Filled 2019-05-17 (×3): qty 1

## 2019-05-17 MED ORDER — SODIUM CHLORIDE 0.9 % IV SOLN
Freq: Once | INTRAVENOUS | Status: AC
  Administered 2019-05-17: 09:00:00 via INTRAVENOUS

## 2019-05-17 MED ORDER — SUCCINYLCHOLINE CHLORIDE 20 MG/ML IJ SOLN
INTRAMUSCULAR | Status: DC | PRN
  Administered 2019-05-17: 100 mg via INTRAVENOUS

## 2019-05-17 MED ORDER — MIDAZOLAM HCL 2 MG/2ML IJ SOLN
INTRAMUSCULAR | Status: AC
  Filled 2019-05-17: qty 2

## 2019-05-17 MED ORDER — ALPRAZOLAM 0.25 MG PO TABS: 0.2500 mg | ORAL_TABLET | Freq: Two times a day (BID) | ORAL | Status: DC | PRN

## 2019-05-17 MED ORDER — LIDOCAINE HCL (CARDIAC) PF 100 MG/5ML IV SOSY
PREFILLED_SYRINGE | INTRAVENOUS | Status: DC | PRN
  Administered 2019-05-17: 100 mg via INTRAVENOUS

## 2019-05-17 MED ORDER — INSULIN ASPART 100 UNIT/ML ~~LOC~~ SOLN
0.0000 [IU] | Freq: Every day | SUBCUTANEOUS | Status: DC
  Administered 2019-05-17: 22:00:00 2 [IU] via SUBCUTANEOUS
  Filled 2019-05-17: qty 1

## 2019-05-17 MED ORDER — IOHEXOL 300 MG/ML  SOLN
75.0000 mL | Freq: Once | INTRAMUSCULAR | Status: AC | PRN
  Administered 2019-05-17: 10:00:00 75 mL via INTRAVENOUS

## 2019-05-17 MED ORDER — ONDANSETRON HCL 4 MG/2ML IJ SOLN
4.0000 mg | Freq: Once | INTRAMUSCULAR | Status: AC
  Administered 2019-05-17: 08:00:00 4 mg via INTRAVENOUS
  Filled 2019-05-17: qty 2

## 2019-05-17 MED ORDER — LACTATED RINGERS IV SOLN
INTRAVENOUS | Status: DC | PRN
  Administered 2019-05-17: 11:00:00 via INTRAVENOUS

## 2019-05-17 MED ORDER — CIPROFLOXACIN IN D5W 400 MG/200ML IV SOLN
400.0000 mg | Freq: Once | INTRAVENOUS | Status: AC
  Administered 2019-05-17: 09:00:00 400 mg via INTRAVENOUS
  Filled 2019-05-17: qty 200

## 2019-05-17 MED ORDER — FENTANYL CITRATE (PF) 100 MCG/2ML IJ SOLN
INTRAMUSCULAR | Status: DC | PRN
  Administered 2019-05-17: 50 ug via INTRAVENOUS

## 2019-05-17 MED ORDER — MEPERIDINE HCL 50 MG/ML IJ SOLN: 6.2500 mg | INTRAMUSCULAR | Status: DC | PRN

## 2019-05-17 MED ORDER — TAMSULOSIN HCL 0.4 MG PO CAPS
0.4000 mg | ORAL_CAPSULE | Freq: Every day | ORAL | Status: DC
  Administered 2019-05-17 – 2019-05-18 (×2): 0.4 mg via ORAL
  Filled 2019-05-17 (×2): qty 1

## 2019-05-17 MED ORDER — SODIUM CHLORIDE 0.9 % IV SOLN
2.0000 g | Freq: Once | INTRAVENOUS | Status: AC
  Administered 2019-05-17: 13:00:00 2 g via INTRAVENOUS
  Filled 2019-05-17: qty 2

## 2019-05-17 MED ORDER — PHENYLEPHRINE HCL (PRESSORS) 10 MG/ML IV SOLN
INTRAVENOUS | Status: DC | PRN
  Administered 2019-05-17 (×5): 100 ug via INTRAVENOUS

## 2019-05-17 SURGICAL SUPPLY — 21 items
BAG DRAIN CYSTO-URO LG1000N (MISCELLANEOUS) ×2 IMPLANT
BAG URINE DRAINAGE (UROLOGICAL SUPPLIES) ×1 IMPLANT
BRUSH SCRUB EZ  4% CHG (MISCELLANEOUS)
BRUSH SCRUB EZ 4% CHG (MISCELLANEOUS) IMPLANT
CATH FOL 2WAY LX 16X30 (CATHETERS) ×1 IMPLANT
CATH URETL 5X70 OPEN END (CATHETERS) ×2 IMPLANT
GLOVE BIO SURGEON STRL SZ 6.5 (GLOVE) ×2 IMPLANT
GOWN STRL REUS W/ TWL LRG LVL3 (GOWN DISPOSABLE) ×2 IMPLANT
GOWN STRL REUS W/TWL LRG LVL3 (GOWN DISPOSABLE) ×2
GUIDEWIRE STR DUAL SENSOR (WIRE) ×2 IMPLANT
HOLDER FOLEY CATH W/STRAP (MISCELLANEOUS) ×1 IMPLANT
KIT TURNOVER CYSTO (KITS) ×2 IMPLANT
PACK CYSTO AR (MISCELLANEOUS) ×2 IMPLANT
SET CYSTO W/LG BORE CLAMP LF (SET/KITS/TRAYS/PACK) ×2 IMPLANT
SOL .9 NS 3000ML IRR  AL (IV SOLUTION) ×1
SOL .9 NS 3000ML IRR UROMATIC (IV SOLUTION) ×1 IMPLANT
STENT URET 6FRX24 CONTOUR (STENTS) ×1 IMPLANT
STENT URET 6FRX26 CONTOUR (STENTS) IMPLANT
SURGILUBE 2OZ TUBE FLIPTOP (MISCELLANEOUS) ×2 IMPLANT
SYRINGE IRR TOOMEY STRL 70CC (SYRINGE) ×2 IMPLANT
WATER STERILE IRR 1000ML POUR (IV SOLUTION) ×2 IMPLANT

## 2019-05-17 NOTE — Anesthesia Post-op Follow-up Note (Signed)
Anesthesia QCDR form completed.        

## 2019-05-17 NOTE — ED Triage Notes (Signed)
Sudden onset left side abd/flank pain yest eve.  Vomited x 2.  Still ahving pian

## 2019-05-17 NOTE — ED Notes (Signed)
Patient returned from CT

## 2019-05-17 NOTE — Transfer of Care (Signed)
Immediate Anesthesia Transfer of Care Note  Patient: Pam Khan  Procedure(s) Performed: CYSTOSCOPY WITH STENT PLACEMENT (Left Ureter)  Patient Location: PACU  Anesthesia Type:General  Level of Consciousness: sedated  Airway & Oxygen Therapy: Patient Spontanous Breathing and Patient connected to face mask oxygen  Post-op Assessment: Report given to RN and Post -op Vital signs reviewed and stable  Post vital signs: Reviewed and stable  Last Vitals:  Vitals Value Taken Time  BP 96/58 05/17/19 1150  Temp    Pulse 111 05/17/19 1150  Resp 31 05/17/19 1150  SpO2 100 % 05/17/19 1150  Vitals shown include unvalidated device data.  Last Pain:  Vitals:   05/17/19 1100  TempSrc:   PainSc: 3          Complications: No apparent anesthesia complications

## 2019-05-17 NOTE — Progress Notes (Signed)
Updated Dr. Lavone Neri with current vital signs and temperature (99.37F). Pt has continuous IV fluids, IV antibiotic completed, 84ml urine from [facility]. Pt denies pain or discomfort. Per Dr. Lavone Neri, it is acceptable to transport patient to the floor at this time. 15 minute called to [facility].

## 2019-05-17 NOTE — ED Notes (Signed)
Patient transported to CT 

## 2019-05-17 NOTE — Progress Notes (Signed)
Awaiting IV antibiotic in PACU. Called pharmacy for update. Pharmacy states it will be verified and sent to [facility].

## 2019-05-17 NOTE — Consult Note (Signed)
Urology Consult  I have been asked to see the patient by Dr. Renae GlossWieting, for evaluation and management of left ureteral stone with concern for urosepsis.  Chief Complaint: left flank pain  History of Present Illness: Pam Khan is a 48 y.o. year old female who presented to the emergency department this morning with complaints of sudden onset left flank pain.  She states the pain began yesterday evening and was associated with 2 episodes of emesis.  The pain persists today.  She denies a personal history of nephrolithiasis, but reports that she did have a prior kidney infection during her pregnancy.  On presentation, patient was found to be tachycardic to the 140s with an elevated respiratory rate in the 20s.  She is afebrile.  Patient reports tachycardia at baseline.  Additionally, she was found be neutropenic to 2.9, with a creatinine of 1.43, elevated anion gap.  Urinalysis was significant for the presence of nitrites, moderate RBCs, few bacteria, and greater than 50 WBCs per high-powered field.  Urine cultures are pending.  On imaging, there was found to be a 3 mm obstructing stone in the proximal left ureter with mild left hydronephrosis and hydroureter.  On physical exam, patient is remarkably well-appearing.  She reports that her flank pain is controlled at the moment, but she anticipates that it will return.  She has no other physical complaints at this time.  Past Medical History:  Diagnosis Date   Anxiety    Asthma    Barrett esophagus    Hypertension     Past Surgical History:  Procedure Laterality Date   BACK SURGERY  1985   CESAREAN SECTION  1989   COSMETIC SURGERY  1985   head   DILATION AND CURETTAGE OF UTERUS  2007    Home Medications:  Current Meds  Medication Sig   ALPRAZolam (XANAX) 0.25 MG tablet Take 1 tablet (0.25 mg total) by mouth 2 (two) times daily as needed for anxiety.   diltiazem (CARDIZEM CD) 120 MG 24 hr capsule TAKE 1 CAPSULE BY  MOUTH AT BEDTIME FOR HEART RATE   norethindrone (MICRONOR,CAMILA,ERRIN) 0.35 MG tablet TAKE 1 TABLET BY MOUTH EVERY DAY   omeprazole (PRILOSEC) 40 MG capsule TAKE ONE CAPSULE BY MOUTH EVERY DAY FOR HEART BURN   triamterene-hydrochlorothiazide (MAXZIDE-25) 37.5-25 MG tablet TAKE 1 TABLET BY MOUTH EVERY DAY    Allergies: No Known Allergies  Family History  Problem Relation Age of Onset   Breast cancer Mother 2542   Breast cancer Maternal Grandmother [id number]   Diabetes Father    Heart attack Father     Social History:  reports that she has never smoked. She has never used smokeless tobacco. She reports current alcohol use. She reports that she does not use drugs.  ROS: A complete review of systems was performed.  All systems are negative except for pertinent findings as noted.  Physical Exam:  Vital signs in last 24 hours: Temp:  [98.7 F (37.1 C)] 98.7 F (37.1 C) (07/09 0742) Pulse Rate:  [115-143] 122 (07/09 0930) Resp:  [19-28] 19 (07/09 0930) BP: (126-147)/(70-71) 126/70 (07/09 0930) SpO2:  [95 %-99 %] 99 % (07/09 0930) Constitutional:  Alert and oriented, No acute distress HEENT: [place] AT, moist mucus membranes.  Trachea midline, no masses Cardiovascular: Tachycardic, no clubbing, cyanosis, or edema. Respiratory: Increased respiratory rate, normal respiratory effort, lungs clear bilaterally GI: Abdomen is soft, nontender, nondistended, no abdominal masses GU: No CVA tenderness Skin: No rashes, bruises or  suspicious lesions Lymph: No cervical or inguinal adenopathy Neurologic: Grossly intact, no focal deficits, moving all 4 extremities Psychiatric: Normal mood and affect   Laboratory Data:  Recent Labs    05/17/19 0807  WBC 2.9*  HGB 15.1*  HCT 44.0   Recent Labs    05/17/19 0807  NA 134*  K 3.8  CL 100  CO2 16*  GLUCOSE 228*  BUN 25*  CREATININE 1.43*  CALCIUM 9.2   Urinalysis    Component Value Date/Time   COLORURINE YELLOW (A) 05/17/2019 0807    APPEARANCEUR CLOUDY (A) 05/17/2019 0807   APPEARANCEUR Clear 06/28/2018 0000   LABSPEC 1.023 05/17/2019 0807   PHURINE 6.0 05/17/2019 0807   GLUCOSEU NEGATIVE 05/17/2019 0807   HGBUR MODERATE (A) 05/17/2019 0807   BILIRUBINUR NEGATIVE 05/17/2019 0807   BILIRUBINUR Negative 06/28/2018 0000   KETONESUR NEGATIVE 05/17/2019 0807   PROTEINUR 30 (A) 05/17/2019 0807   NITRITE POSITIVE (A) 05/17/2019 0807   LEUKOCYTESUR MODERATE (A) 05/17/2019 0807   Radiologic Imaging: Ct Abdomen Pelvis W Contrast  Result Date: 05/17/2019 CLINICAL DATA:  Abdominal pain,, left flank pain, diverticulitis suspected EXAM: CT ABDOMEN AND PELVIS WITH CONTRAST TECHNIQUE: Multidetector CT imaging of the abdomen and pelvis was performed using the standard protocol following bolus administration of intravenous contrast. CONTRAST:  37mL OMNIPAQUE IOHEXOL 300 MG/ML  SOLN COMPARISON:  None. FINDINGS: Lower chest: No acute abnormality. Hepatobiliary: No solid liver abnormality is seen. No gallstones, gallbladder wall thickening, or biliary dilatation. Pancreas: Unremarkable. No pancreatic ductal dilatation or surrounding inflammatory changes. Spleen: Normal in size without significant abnormality. Adrenals/Urinary Tract: Adrenal glands are unremarkable. There is a 3 mm calculus of the proximal third of the left ureter with mild associated left hydronephrosis and hydroureter. Mild left perinephric fat stranding. No other evidence of urinary tract calculus. Bladder is unremarkable. Stomach/Bowel: Stomach is within normal limits. Appendix appears normal. No evidence of bowel wall thickening, distention, or inflammatory changes. Vascular/Lymphatic: No significant vascular findings are present. No enlarged abdominal or pelvic lymph nodes. Reproductive: No mass or other significant abnormality. Other: No abdominal wall hernia or abnormality. No abdominopelvic ascites. Musculoskeletal: Posterior thoracolumbar fusion about a wedge deformity of  the T12 vertebral body IMPRESSION: 1. There is a 3 mm calculus of the proximal third of the left ureter with mild associated left hydronephrosis and hydroureter. Mild left perinephric fat stranding. No other evidence of urinary tract calculus. 2. No significant diverticular disease or evidence of diverticulitis. Electronically Signed   By: Eddie Candle M.D.   On: 05/17/2019 09:56    Assessment & Plan:  48 year old female with obstructing left proximal ureteral calculus and concern for sepsis including tachycardia, leukopenia, acute renal insufficiency, positive urinalysis amongst others.  Strongly recommend emergent left ureteral stent placement for source control and left urinary drainage.  We discussed the procedure in detail including risk of bleeding, infection, damage surrounding structures amongst others.  We also discussed the possibility of acute worsening of her sepsis-like picture and possible need for ICU admission should her vitals destabilize.  She understands all this and is willing to proceed as planned.  She will be admitted postoperatively to the medicine service for supportive care.  All of her questions were answered.  We will continue to follow along.  She will need to return at some later date for definitive stone management which she understands as well.  05/17/2019, 10:55 AM  Hollice Espy,  MD

## 2019-05-17 NOTE — ED Provider Notes (Signed)
[facility] Emergency Department Provider Note       Time seen: ----------------------------------------- 7:51 AM on 05/17/2019 -----------------------------------------   I have reviewed the triage vital signs and the nursing notes.  HISTORY   Chief Complaint Abdominal Pain, Flank Pain, and Emesis    HPI Pam Khan is a 48 y.o. female with a history of anxiety, asthma, hypertension, hyperlipidemia who presents to the ED for sudden onset of left flank pain that radiates into her back since yesterday evening.  She has vomited twice.  Pain is 8 out of 10, nothing makes it better.  She still having the pain.  She reports her heart rate is always elevated.  Past Medical History:  Diagnosis Date  . Anxiety   . Asthma   . Barrett esophagus   . Hypertension     Patient Active Problem List   Diagnosis Date Noted  . Acquired trigger finger 06/05/2015  . Lateral epicondylitis 06/05/2015  . Dyslipidemia 02/14/2014  . GERD (gastroesophageal reflux disease) 02/14/2014  . HTN (hypertension) 02/14/2014    Past Surgical History:  Procedure Laterality Date  . BACK SURGERY  1985  . CESAREAN SECTION  1989  . COSMETIC SURGERY  1985   head  . DILATION AND CURETTAGE OF UTERUS  2007    Allergies Patient has no known allergies.  Social History Social History   Tobacco Use  . Smoking status: Never Smoker  . Smokeless tobacco: Never Used  Substance Use Topics  . Alcohol use: No    Frequency: Never  . Drug use: No   Review of Systems Constitutional: Negative for fever. Cardiovascular: Negative for chest pain. Respiratory: Negative for shortness of breath. Gastrointestinal: Positive for abdominal pain, vomiting Musculoskeletal: Positive for flank pain Skin: Negative for rash. Neurological: Negative for headaches, focal weakness or numbness.  All systems negative/normal/unremarkable except as stated in the HPI   EKG: Interpreted by me, sinus  tachycardia with a rate of 141 bpm, mild ST depressions are noted inferiorly, normal axis, normal QT ____________________________________________   PHYSICAL EXAM:  VITAL SIGNS: ED Triage Vitals  Enc Vitals Group     BP 05/17/19 0742 (!) 147/71     Pulse Rate 05/17/19 0742 (!) 143     Resp --      Temp 05/17/19 0742 98.7 F (37.1 C)     Temp Source 05/17/19 0742 Oral     SpO2 05/17/19 0742 96 %     Weight --      Height --      Head Circumference --      Peak Flow --      Pain Score 05/17/19 0734 8     Pain Loc --      Pain Edu? --      Excl. in GC? --    Constitutional: Alert and oriented. Well appearing, mild distress Eyes: Conjunctivae are normal. Normal extraocular movements. ENT      Head: Normocephalic and atraumatic.      Nose: No congestion/rhinnorhea.      Mouth/Throat: Mucous membranes are moist.      Neck: No stridor. Cardiovascular: Rapid rate, regular rhythm. No murmurs, rubs, or gallops. Respiratory: Normal respiratory effort without tachypnea nor retractions. Breath sounds are clear and equal bilaterally. No wheezes/rales/rhonchi. Gastrointestinal: Left upper quadrant and left flank tenderness, left CVA tenderness, normal bowel sounds. Musculoskeletal: Nontender with normal range of motion in extremities. No lower extremity tenderness nor edema. Neurologic:  Normal speech and language. No gross focal  neurologic deficits are appreciated.  Skin:  Skin is warm, dry and intact. No rash noted. Psychiatric: Mood and affect are normal. Speech and behavior are normal.  ___________________________________________  ED COURSE:  As part of my medical decision making, I reviewed the following data within the electronic MEDICAL RECORD NUMBER History obtained from family if available, nursing notes, old chart and ekg, as well as notes from prior ED visits. Patient presented for abdominal pain, we will assess with labs and imaging as indicated at this time.   Procedures  Pam Khan was evaluated in Emergency Department on 05/17/2019 for the symptoms described in the history of present illness. She was evaluated in the context of the global COVID-19 pandemic, which necessitated consideration that the patient might be at risk for infection with the SARS-CoV-2 virus that causes COVID-19. Institutional protocols and algorithms that pertain to the evaluation of patients at risk for COVID-19 are in a state of rapid change based on information released by regulatory bodies including the CDC and federal and state organizations. These policies and algorithms were followed during the patient's care in the ED.  ____________________________________________   LABS (pertinent positives/negatives)  Labs Reviewed  CBC WITH DIFFERENTIAL/PLATELET - Abnormal; Notable for the following components:      Result Value   WBC 2.9 (*)    Hemoglobin 15.1 (*)    Lymphs Abs 0.2 (*)    Monocytes Absolute 0.0 (*)    All other components within normal limits  COMPREHENSIVE METABOLIC PANEL - Abnormal; Notable for the following components:   Sodium 134 (*)    CO2 16 (*)    Glucose, Bld 228 (*)    BUN 25 (*)    Creatinine, Ser 1.43 (*)    ALT 47 (*)    Total Bilirubin 1.9 (*)    GFR calc non Af Amer 43 (*)    GFR calc Af Amer 49 (*)    Anion gap 18 (*)    All other components within normal limits  URINALYSIS, COMPLETE (UACMP) WITH MICROSCOPIC - Abnormal; Notable for the following components:   Color, Urine YELLOW (*)    APPearance CLOUDY (*)    Hgb urine dipstick MODERATE (*)    Protein, ur 30 (*)    Nitrite POSITIVE (*)    Leukocytes,Ua MODERATE (*)    WBC, UA >50 (*)    Bacteria, UA FEW (*)    Non Squamous Epithelial PRESENT (*)    All other components within normal limits  URINE CULTURE  SARS CORONAVIRUS 2 (HOSPITAL ORDER, PERFORMED IN [facility])  LIPASE, BLOOD    RADIOLOGY Images were viewed by me  CT the abdomen pelvis with contrast IMPRESSION: 1. There  is a 3 mm calculus of the proximal third of the left ureter with mild associated left hydronephrosis and hydroureter. Mild left perinephric fat stranding. No other evidence of urinary tract calculus.  2. No significant diverticular disease or evidence of diverticulitis. ____________________________________________   DIFFERENTIAL DIAGNOSIS   Renal colic, pyelonephritis, UTI, flank pain, diverticulitis  FINAL ASSESSMENT AND PLAN  Pyelonephritis, proximal left ureteral stone   Plan: The patient had presented for worsening left flank pain with vomiting. Patient's labs did indicate likely pyelonephritis. Patient's imaging revealed a proximal left ureteral stone.  I have discussed with urology who requested admission by the hospitalist service and will arrange for stenting later today.   Ulice DashJohnathan E Hortencia Martire, MD    Note: This note was generated in part or whole with  voice recognition software. Voice recognition is usually quite accurate but there are transcription errors that can and very often do occur. I apologize for any typographical errors that were not detected and corrected.     Earleen Newport, MD 05/17/19 1010

## 2019-05-17 NOTE — Op Note (Signed)
Date of procedure: 05/17/19  Preoperative diagnosis:  1. Left obstructing ureteral calculus 2. UTI/pyelonephritis  Postoperative diagnosis:  1. Same as above  Procedure: 1. Cystoscopy 2. Left retrograde pyelogram 3. Left ureteral stent placement  Surgeon: Hollice Espy, MD  Anesthesia: General  Complications: None  Intraoperative findings: Delayed nephrogram appreciated on scout imaging.  Stent placed without difficulty.  Significant hydronephrosis with purulent drainage upon stent placement.  EBL: Minimal  Specimens: None  Drains: 57 French Foley catheter, 6 x 24 French double-J ureteral stent on left  Indication: TYKERA SKATES is a 48 y.o. patient with obstructing left proximal ureteral calculus and concern for sepsis including tachycardia, leukopenia, acute kidney injury, and UA consistent with pyelonephritis.  After reviewing the management options for treatment,  she elected to proceed with the above surgical procedure(s). We have discussed the potential benefits and risks of the procedure, side effects of the proposed treatment, the likelihood of the patient achieving the goals of the procedure, and any potential problems that might occur during the procedure or recuperation. Informed consent has been obtained.  Description of procedure:  The patient was taken to the operating room and general anesthesia was induced.  The patient was placed in the dorsal lithotomy position, prepped and draped in the usual sterile fashion, and preoperative antibiotics were administered. A preoperative time-out was performed.   A 21 French scope was advanced per urethra into the bladder.  Notably, there was some mild diffuse nonspecific erythema consistent with cystitis.  Attention was turned to the left ureteral orifice.  On scout imaging, delayed nephrogram on the left could be appreciated with some fullness of the collecting system without overt hydronephrosis with contrast to the level of  the stone but not beyond this.  The left UO was intubated with a 5 [place] open-ended ureteral catheter and a touch of contrast was injected to opacify the ureter at very low pressure.  A wire was then placed up to level the kidney without difficulty.  A 6 x 24 French double-J ureteral stent was then advanced over the wire up to level the kidney.  The wire was partially drawn till full coil was noted both within the kidney as well as in the bladder.  Notably, upon stent placement, there was copious amounts of purulent drainage from the UO itself as well as from the perforations of the stent consistent with pyonephrosis.  The bladder was then drained.  A 16 French Foley catheter was then placed with 10 cc of sterile water in the balloon.  She was then repositioned supine position, reversed myesthesia, taken the PACU in stable condition.  Plan: Patient will be admitted for IV antibiotics.  She had received IV Cipro in the emergency room and I have ordered additional IV ceftriaxone to be given in the PACU to broaden her antibiotics.  We will leave the Foley catheter in place overnight for Vaxcel urinary drainage. She is being admitted the medicine service.  Hollice Espy, M.D.

## 2019-05-17 NOTE — Anesthesia Procedure Notes (Signed)
Procedure Name: Intubation Date/Time: 05/17/2019 11:27 AM Performed by: Nelda Marseille, CRNA Pre-anesthesia Checklist: Patient identified, Patient being monitored, Timeout performed, Emergency Drugs available and Suction available Patient Re-evaluated:Patient Re-evaluated prior to induction Oxygen Delivery Method: Circle system utilized Preoxygenation: Pre-oxygenation with 100% oxygen Induction Type: IV induction Ventilation: Mask ventilation without difficulty Laryngoscope Size: Mac, 3 and McGraph Grade View: Grade I Tube type: Oral Tube size: 7.0 mm Number of attempts: 1 Airway Equipment and Method: Stylet Placement Confirmation: ETT inserted through vocal cords under direct vision,  positive ETCO2 and breath sounds checked- equal and bilateral Secured at: 21 cm Tube secured with: Tape Dental Injury: Teeth and Oropharynx as per pre-operative assessment

## 2019-05-17 NOTE — H&P (Signed)
[facility] [place] at [facility]   PATIENT NAME: Pam Khan Shuck    MR#:  [id number]  DATE OF BIRTH:  09/15/1971  DATE OF ADMISSION:  05/17/2019  PRIMARY CARE PHYSICIAN: [name], [name], MD   REQUESTING/REFERRING PHYSICIAN: Dr Presley RaddleJohnathan Williams  CHIEF COMPLAINT:   Chief Complaint  Patient presents with  . Abdominal Pain  . Flank Pain  . Emesis    HISTORY OF PRESENT ILLNESS:  Pam Khan  is a 48 y.o. female with a known history of hypertension presents with abdominal pain.  Yesterday she thought she had a urinary tract infection.  Last night she developed some sharp left abdominal pain and vomited secondary to the pain.  She has been feeling hot and cold all night.  She got worse throughout the night and vomited again. Pain this morning was 10 out of 10 in intensity and she did not take it anymore.  She decided to come into the ER for further evaluation.  Pain relieved by morphine in the emergency room and currently having no pain at all.  In the ER she was found to have a left-sided kidney stone with hydronephrosis and hydroureter.  ER physician spoke with urology who will do a stenting procedure later today.  Hospitalist services were contacted for further evaluation.  PAST MEDICAL HISTORY:   Past Medical History:  Diagnosis Date  . Anxiety   . Asthma   . Barrett esophagus   . Hypertension     PAST SURGICAL HISTORY:   Past Surgical History:  Procedure Laterality Date  . BACK SURGERY  1985  . CESAREAN SECTION  1989  . COSMETIC SURGERY  1985   head  . DILATION AND CURETTAGE OF UTERUS  2007    SOCIAL HISTORY:   Social History   Tobacco Use  . Smoking status: Never Smoker  . Smokeless tobacco: Never Used  Substance Use Topics  . Alcohol use: Yes    Frequency: Never    Comment: occasionally    FAMILY HISTORY:   Family History  Problem Relation Age of Onset  . Breast cancer Mother [id number]  . Breast cancer Maternal Grandmother 60  .  Diabetes Father   . Heart attack Father     DRUG ALLERGIES:  No Known Allergies  REVIEW OF SYSTEMS:  CONSTITUTIONAL: No fever, positive for hot and cold feeling.  Positive for fatigue.  EYES: No blurred or double vision.  EARS, NOSE, AND THROAT: No tinnitus or ear pain. No sore throat RESPIRATORY: No cough, shortness of breath, wheezing or hemoptysis.  CARDIOVASCULAR: No chest pain, orthopnea, edema.  GASTROINTESTINAL: Positive for nausea, vomiting, and abdominal pain. No blood in bowel movements.  No diarrhea or constipation. GENITOURINARY: Some dysuria, no hematuria.  ENDOCRINE: No polyuria, nocturia,  HEMATOLOGY: No anemia, easy bruising or bleeding SKIN: No rash or lesion. MUSCULOSKELETAL: No joint pain or arthritis.   NEUROLOGIC: No tingling, numbness, weakness.  PSYCHIATRY: No anxiety or depression.   MEDICATIONS AT HOME:   Prior to Admission medications   Medication Sig Start Date End Date Taking? Authorizing Provider  ALPRAZolam (XANAX) 0.25 MG tablet Take 1 tablet (0.25 mg total) by mouth 2 (two) times daily as needed for anxiety. 06/28/18  Yes Scarboro, Coralee NorthAdam J, NP  diltiazem (CARDIZEM CD) 120 MG 24 hr capsule TAKE 1 CAPSULE BY MOUTH AT BEDTIME FOR HEART RATE 03/19/19  Yes Johnna AcostaScarboro, Adam J, NP  norethindrone (MICRONOR,CAMILA,ERRIN) 0.35 MG tablet TAKE 1 TABLET BY MOUTH EVERY DAY 01/30/19  Yes Boscia,  Heather E, NP  omeprazole (PRILOSEC) 40 MG capsule TAKE ONE CAPSULE BY MOUTH EVERY DAY FOR HEART BURN 09/25/18  Yes Boscia, Heather E, NP  triamterene-hydrochlorothiazide (MAXZIDE-25) 37.5-25 MG tablet TAKE 1 TABLET BY MOUTH EVERY DAY 08/28/18  Yes Boscia, Heather E, NP      VITAL SIGNS:  Blood pressure 126/70, pulse (!) 122, temperature 98.7 F (37.1 C), temperature source Oral, resp. rate 19, SpO2 99 %.  PHYSICAL EXAMINATION:  GENERAL:  48 y.o.-year-old patient lying in the bed with no acute distress.  EYES: Pupils equal, round, reactive to light and accommodation. No  scleral icterus. Extraocular muscles intact.  HEENT: Head atraumatic, normocephalic. Oropharynx and nasopharynx clear.  NECK:  Supple, no jugular venous distention. No thyroid enlargement, no tenderness.  LUNGS: Normal breath sounds bilaterally, no wheezing, rales,rhonchi or crepitation. No use of accessory muscles of respiration.  CARDIOVASCULAR: S1, S2 tachycardic. No murmurs, rubs, or gallops.  ABDOMEN: Soft, nontender, nondistended. Bowel sounds present. No organomegaly or mass.  EXTREMITIES: No pedal edema, cyanosis, or clubbing.  NEUROLOGIC: Cranial nerves II through XII are intact. Muscle strength 5/5 in all extremities. Sensation intact. Gait not checked.  PSYCHIATRIC: The patient is alert and oriented x 3.  SKIN: No rash, lesion, or ulcer.   LABORATORY PANEL:   CBC Recent Labs  Lab 05/17/19 0807  WBC 2.9*  HGB 15.1*  HCT 44.0  PLT 242   ------------------------------------------------------------------------------------------------------------------  Chemistries  Recent Labs  Lab 05/17/19 0807  NA 134*  K 3.8  CL 100  CO2 16*  GLUCOSE 228*  BUN 25*  CREATININE 1.43*  CALCIUM 9.2  AST 40  ALT 47*  ALKPHOS 100  BILITOT 1.9*   ------------------------------------------------------------------------------------------------------------------    RADIOLOGY:  Ct Abdomen Pelvis W Contrast  Result Date: 05/17/2019 CLINICAL DATA:  Abdominal pain,, left flank pain, diverticulitis suspected EXAM: CT ABDOMEN AND PELVIS WITH CONTRAST TECHNIQUE: Multidetector CT imaging of the abdomen and pelvis was performed using the standard protocol following bolus administration of intravenous contrast. CONTRAST:  75mL OMNIPAQUE IOHEXOL 300 MG/ML  SOLN COMPARISON:  None. FINDINGS: Lower chest: No acute abnormality. Hepatobiliary: No solid liver abnormality is seen. No gallstones, gallbladder wall thickening, or biliary dilatation. Pancreas: Unremarkable. No pancreatic ductal dilatation or  surrounding inflammatory changes. Spleen: Normal in size without significant abnormality. Adrenals/Urinary Tract: Adrenal glands are unremarkable. There is a 3 mm calculus of the proximal third of the left ureter with mild associated left hydronephrosis and hydroureter. Mild left perinephric fat stranding. No other evidence of urinary tract calculus. Bladder is unremarkable. Stomach/Bowel: Stomach is within normal limits. Appendix appears normal. No evidence of bowel wall thickening, distention, or inflammatory changes. Vascular/Lymphatic: No significant vascular findings are present. No enlarged abdominal or pelvic lymph nodes. Reproductive: No mass or other significant abnormality. Other: No abdominal wall hernia or abnormality. No abdominopelvic ascites. Musculoskeletal: Posterior thoracolumbar fusion about a wedge deformity of the T12 vertebral body IMPRESSION: 1. There is a 3 mm calculus of the proximal third of the left ureter with mild associated left hydronephrosis and hydroureter. Mild left perinephric fat stranding. No other evidence of urinary tract calculus. 2. No significant diverticular disease or evidence of diverticulitis. Electronically Signed   By: Lauralyn PrimesAlex  Bibbey M.D.   On: 05/17/2019 09:56    EKG:   Sinus tachycardia 141 bpm  IMPRESSION AND PLAN:   1.  Clinical sepsis with leukopenia and sinus tachycardia with findings concerning for acute pyelonephritis.  ER physician ordered IV Cipro.  Will continue until urine culture  back. 2.  Obstructing kidney stone with hydro-ureter and hydronephrosis.  ER physician spoke with urology and they will do a stenting procedure later today.  Keep n.p.o.  COVID-19 testing pending.  Flomax ordered. 3.  Hypertension and tachycardia we will give Cardizem CD this morning.  Hold triamterene hydrochlorothiazide. 4.  Acute kidney injury and dehydration.  IV fluid hydration. 5.  Impaired fasting glucose.  Check a hemoglobin A1c and place on sliding scale. 6.   Elevated liver enzymes.  Likely with clinical sepsis.  Continue to monitor.  All the records are reviewed and case discussed with ED provider. Management plans discussed with the patient, and she is in agreement.  Declined me calling family at this time.  CODE STATUS: Full code  TOTAL TIME TAKING CARE OF THIS PATIENT: 45 minutes.    Loletha Grayer M.D on 05/17/2019 at 10:34 AM  Between 7am to 6pm - Pager - [phone]  After 6pm call admission pager [phone]  [facility]  [phone]  CC: Primary care physician; Lavera Guise, MD

## 2019-05-17 NOTE — ED Notes (Signed)
Patient transported to OR.

## 2019-05-17 NOTE — Anesthesia Preprocedure Evaluation (Addendum)
Anesthesia Evaluation  Patient identified by MRN, date of birth, ID band Patient awake    Reviewed: Allergy & Precautions, H&P , NPO status , reviewed documented beta blocker date and time   Airway Mallampati: II  TM Distance: >3 FB Neck ROM: full    Dental  (+) Chipped   Pulmonary asthma ,    Pulmonary exam normal        Cardiovascular hypertension, Normal cardiovascular exam     Neuro/Psych PSYCHIATRIC DISORDERS Anxiety    GI/Hepatic GERD  ,  Endo/Other    Renal/GU      Musculoskeletal  (+) Arthritis ,   Abdominal   Peds  Hematology   Anesthesia Other Findings Past Medical History: No date: Anxiety No date: Asthma No date: Barrett esophagus No date: Hypertension Past Surgical History: 1985: BACK SURGERY 1989: CESAREAN SECTION 1985: COSMETIC SURGERY     Comment:  head 2007: DILATION AND CURETTAGE OF UTERUS   Reproductive/Obstetrics                            Anesthesia Physical Anesthesia Plan  ASA: II and emergent  Anesthesia Plan: General   Post-op Pain Management:    Induction: Intravenous  PONV Risk Score and Plan: 3 and Ondansetron, Midazolam and Metaclopromide  Airway Management Planned: Oral ETT  Additional Equipment:   Intra-op Plan:   Post-operative Plan: Extubation in OR  Informed Consent: I have reviewed the patients History and Physical, chart, labs and discussed the procedure including the risks, benefits and alternatives for the proposed anesthesia with the patient or authorized representative who has indicated his/her understanding and acceptance.     Dental Advisory Given  Plan Discussed with: CRNA  Anesthesia Plan Comments:         Anesthesia Quick Evaluation

## 2019-05-17 NOTE — Anesthesia Postprocedure Evaluation (Signed)
Anesthesia Post Note  Patient: Pam Khan  Procedure(s) Performed: CYSTOSCOPY WITH STENT PLACEMENT (Left Ureter)  Patient location during evaluation: PACU Anesthesia Type: General Level of consciousness: awake and alert Pain management: pain level controlled Vital Signs Assessment: post-procedure vital signs reviewed and stable Respiratory status: spontaneous breathing, nonlabored ventilation and respiratory function stable Cardiovascular status: blood pressure returned to baseline and stable Postop Assessment: no apparent nausea or vomiting Anesthetic complications: no     Last Vitals:  Vitals:   05/17/19 1333 05/17/19 1420  BP: (!) 99/58 105/67  Pulse: (!) 111 (!) 111  Resp: 20 16  Temp: 37.1 C 37 C  SpO2: 98% 98%    Last Pain:  Vitals:   05/17/19 1420  TempSrc: Oral  PainSc:                  [name]

## 2019-05-18 ENCOUNTER — Other Ambulatory Visit: Payer: Self-pay | Admitting: Radiology

## 2019-05-18 ENCOUNTER — Encounter: Payer: Self-pay | Admitting: Urology

## 2019-05-18 DIAGNOSIS — N201 Calculus of ureter: Secondary | ICD-10-CM

## 2019-05-18 LAB — CBC
HCT: 34.4 % — ABNORMAL LOW (ref 36.0–46.0)
Hemoglobin: 11.8 g/dL — ABNORMAL LOW (ref 12.0–15.0)
MCH: 31.8 pg (ref 26.0–34.0)
MCHC: 34.3 g/dL (ref 30.0–36.0)
MCV: 92.7 fL (ref 80.0–100.0)
Platelets: 198 10*3/uL (ref 150–400)
RBC: 3.71 MIL/uL — ABNORMAL LOW (ref 3.87–5.11)
RDW: 12.6 % (ref 11.5–15.5)
WBC: 16.3 10*3/uL — ABNORMAL HIGH (ref 4.0–10.5)
nRBC: 0 % (ref 0.0–0.2)

## 2019-05-18 LAB — HEMOGLOBIN A1C
Hgb A1c MFr Bld: 5.1 % (ref 4.8–5.6)
Mean Plasma Glucose: 100 mg/dL

## 2019-05-18 LAB — BASIC METABOLIC PANEL
Anion gap: 7 (ref 5–15)
BUN: 20 mg/dL (ref 6–20)
CO2: 22 mmol/L (ref 22–32)
Calcium: 8.1 mg/dL — ABNORMAL LOW (ref 8.9–10.3)
Chloride: 110 mmol/L (ref 98–111)
Creatinine, Ser: 0.89 mg/dL (ref 0.44–1.00)
GFR calc Af Amer: >60 mL/min (ref >60)
GFR calc non Af Amer: >60 mL/min (ref >60)
Glucose, Bld: 158 mg/dL — ABNORMAL HIGH (ref 70–99)
Potassium: 4.1 mmol/L (ref 3.5–5.1)
Sodium: 139 mmol/L (ref 135–145)

## 2019-05-18 LAB — GLUCOSE, CAPILLARY
Glucose-Capillary: 148 mg/dL — ABNORMAL HIGH (ref 70–99)
Glucose-Capillary: 254 mg/dL — ABNORMAL HIGH (ref 70–99)

## 2019-05-18 MED ORDER — SULFAMETHOXAZOLE-TRIMETHOPRIM 800-160 MG PO TABS: 1.0000 | ORAL_TABLET | Freq: Two times a day (BID) | ORAL | 0 refills | Status: DC

## 2019-05-18 MED ORDER — TRAMADOL HCL 50 MG PO TABS: 50.0000 mg | ORAL_TABLET | Freq: Four times a day (QID) | ORAL | 0 refills | Status: DC | PRN

## 2019-05-18 NOTE — Discharge Summary (Signed)
[facility] - [place] at [facility]   PATIENT NAME: Pam Khan    MR#:  [id number]  DATE OF BIRTH:  01/08/1971  DATE OF ADMISSION:  05/17/2019 ADMITTING PHYSICIAN: Pam Highlandichard Wieting, MD  DATE OF DISCHARGE: 05/18/2019  PRIMARY CARE PHYSICIAN: Pam CodeKhan, [name], MD    ADMISSION DIAGNOSIS:  Pyelonephritis [N12] Renal colic on left side [N23]  DISCHARGE DIAGNOSIS:  Active Problems:   Sepsis (HCC)   SECONDARY DIAGNOSIS:   Past Medical History:  Diagnosis Date  . Anxiety   . Asthma   . Barrett esophagus   . Hypertension     HOSPITAL COURSE:   48 y/o female with past medical history significant for anxiety who presented to the emergency room with fever.  1.  Sepsis: Patient presented with leukopenia and tachycardia.  Sepsis is due to left proximal ureteral stone with UTI.  Sepsis is resolved.  2.  Obstructing left proximal ureteral stone with urosepsis: Patient is postoperative day #1 from cystoscopy with stent placement.  Urine cultures growing out gram-negative rods.  Patient will follow-up with urology in 1 to 2 weeks. She will be discharged on Bactrim for 10 days.  Sensitivities are pending and adjustment may be needed for antibiotic. She will proceed with ureteroscopy in 2 weeks to remove the obstructing stone.   3.  Essential hypertension: Patient will continue outpatient medications including Cardizem and Maxide  4.  Anxiety: Continue Xanax as needed    DISCHARGE CONDITIONS AND DIET:   Patient stable for discharge on regular diet  CONSULTS OBTAINED:  Treatment Team:  Pam ComeSninsky, Pam C, MD  DRUG ALLERGIES:  No Known Allergies  DISCHARGE MEDICATIONS:   Allergies as of 05/18/2019   No Known Allergies     Medication List    TAKE these medications   ALPRAZolam 0.25 MG tablet Commonly known as: XANAX Take 1 tablet (0.25 mg total) by mouth 2 (two) times daily as needed for anxiety.   diltiazem 120 MG 24 hr capsule Commonly known as: CARDIZEM  CD TAKE 1 CAPSULE BY MOUTH AT BEDTIME FOR HEART RATE   norethindrone 0.35 MG tablet Commonly known as: MICRONOR TAKE 1 TABLET BY MOUTH EVERY DAY   omeprazole 40 MG capsule Commonly known as: PRILOSEC TAKE ONE CAPSULE BY MOUTH EVERY DAY FOR HEART BURN   sulfamethoxazole-trimethoprim 800-160 MG tablet Commonly known as: BACTRIM DS Take 1 tablet by mouth 2 (two) times daily.   traMADol 50 MG tablet Commonly known as: Ultram Take 1 tablet (50 mg total) by mouth every 6 (six) hours as needed.   triamterene-hydrochlorothiazide 37.5-25 MG tablet Commonly known as: MAXZIDE-25 TAKE 1 TABLET BY MOUTH EVERY DAY         Today   CHIEF COMPLAINT:  Patient without complaints of hematuria, frequency or back pain.  No pain or nausea or vomiting.   VITAL SIGNS:  Blood pressure 114/65, pulse 90, temperature 98.7 F (37.1 Khan), temperature source Oral, resp. rate 18, height 5\' 6"  (1.676 Khan), weight 73.5 kg, SpO2 98 %.   REVIEW OF SYSTEMS:  Review of Systems  Constitutional: Negative.  Negative for chills, fever and malaise/fatigue.  HENT: Negative.  Negative for ear discharge, ear pain, hearing loss, nosebleeds and sore throat.   Eyes: Negative.  Negative for blurred vision and pain.  Respiratory: Negative.  Negative for cough, hemoptysis, shortness of breath and wheezing.   Cardiovascular: Negative.  Negative for chest pain, palpitations and leg swelling.  Gastrointestinal: Negative.  Negative for abdominal pain, blood in stool,  diarrhea, nausea and vomiting.  Genitourinary: Negative.  Negative for dysuria.  Musculoskeletal: Negative.  Negative for back pain.  Skin: Negative.   Neurological: Negative for dizziness, tremors, speech change, focal weakness, seizures and headaches.  Endo/Heme/Allergies: Negative.  Does not bruise/bleed easily.  Psychiatric/Behavioral: Negative.  Negative for depression, hallucinations and suicidal ideas.     PHYSICAL EXAMINATION:  GENERAL:  48  y.o.-year-old patient lying in the bed with no acute distress.  NECK:  Supple, no jugular venous distention. No thyroid enlargement, no tenderness.  LUNGS: Normal breath sounds bilaterally, no wheezing, rales,rhonchi  No use of accessory muscles of respiration.  CARDIOVASCULAR: S1, S2 normal. No murmurs, rubs, or gallops.  ABDOMEN: Soft, non-tender, non-distended. Bowel sounds present. No organomegaly or mass.  EXTREMITIES: No pedal edema, cyanosis, or clubbing.  PSYCHIATRIC: The patient is alert and oriented x 3.  SKIN: No obvious rash, lesion, or ulcer.   DATA REVIEW:   CBC Recent Labs  Lab 05/18/19 0451  WBC 16.3*  HGB 11.8*  HCT 34.4*  PLT 198    Chemistries  Recent Labs  Lab 05/17/19 0807 05/18/19 0451  NA 134* 139  K 3.8 4.1  CL 100 110  CO2 16* 22  GLUCOSE 228* 158*  BUN 25* 20  CREATININE 1.43* 0.89  CALCIUM 9.2 8.1*  AST 40  --   ALT 47*  --   ALKPHOS 100  --   BILITOT 1.9*  --     Cardiac Enzymes No results for input(s): TROPONINI in the last 168 hours.  Microbiology Results  @MICRORSLT48 @  RADIOLOGY:  Ct Abdomen Pelvis W Contrast  Result Date: 05/17/2019 CLINICAL DATA:  Abdominal pain,, left flank pain, diverticulitis suspected EXAM: CT ABDOMEN AND PELVIS WITH CONTRAST TECHNIQUE: Multidetector CT imaging of the abdomen and pelvis was performed using the standard protocol following bolus administration of intravenous contrast. CONTRAST:  75mL OMNIPAQUE IOHEXOL 300 MG/ML  SOLN COMPARISON:  None. FINDINGS: Lower chest: No acute abnormality. Hepatobiliary: No solid liver abnormality is seen. No gallstones, gallbladder wall thickening, or biliary dilatation. Pancreas: Unremarkable. No pancreatic ductal dilatation or surrounding inflammatory changes. Spleen: Normal in size without significant abnormality. Adrenals/Urinary Tract: Adrenal glands are unremarkable. There is a 3 mm calculus of the proximal third of the left ureter with mild associated left  hydronephrosis and hydroureter. Mild left perinephric fat stranding. No other evidence of urinary tract calculus. Bladder is unremarkable. Stomach/Bowel: Stomach is within normal limits. Appendix appears normal. No evidence of bowel wall thickening, distention, or inflammatory changes. Vascular/Lymphatic: No significant vascular findings are present. No enlarged abdominal or pelvic lymph nodes. Reproductive: No mass or other significant abnormality. Other: No abdominal wall hernia or abnormality. No abdominopelvic ascites. Musculoskeletal: Posterior thoracolumbar fusion about a wedge deformity of the T12 vertebral body IMPRESSION: 1. There is a 3 mm calculus of the proximal third of the left ureter with mild associated left hydronephrosis and hydroureter. Mild left perinephric fat stranding. No other evidence of urinary tract calculus. 2. No significant diverticular disease or evidence of diverticulitis. Electronically Signed   By: Lauralyn PrimesAlex  Bibbey Khan.D.   On: 05/17/2019 09:56      Allergies as of 05/18/2019   No Known Allergies     Medication List    TAKE these medications   ALPRAZolam 0.25 MG tablet Commonly known as: XANAX Take 1 tablet (0.25 mg total) by mouth 2 (two) times daily as needed for anxiety.   diltiazem 120 MG 24 hr capsule Commonly known as: CARDIZEM CD TAKE 1 CAPSULE  BY MOUTH AT BEDTIME FOR HEART RATE   norethindrone 0.35 MG tablet Commonly known as: MICRONOR TAKE 1 TABLET BY MOUTH EVERY DAY   omeprazole 40 MG capsule Commonly known as: PRILOSEC TAKE ONE CAPSULE BY MOUTH EVERY DAY FOR HEART BURN   sulfamethoxazole-trimethoprim 800-160 MG tablet Commonly known as: BACTRIM DS Take 1 tablet by mouth 2 (two) times daily.   traMADol 50 MG tablet Commonly known as: Ultram Take 1 tablet (50 mg total) by mouth every 6 (six) hours as needed.   triamterene-hydrochlorothiazide 37.5-25 MG tablet Commonly known as: MAXZIDE-25 TAKE 1 TABLET BY MOUTH EVERY DAY            Management plans discussed with the patient and shee is in agreement. Stable for discharge home  Patient should follow up with urology  CODE STATUS:     Code Status Orders  (From admission, onward)         Start     Ordered   05/17/19 1031  Full code  Continuous     05/17/19 1030        Code Status History    This patient has a current code status but no historical code status.   Advance Care Planning Activity      TOTAL TIME TAKING CARE OF THIS PATIENT: 38 minutes.    Note: This dictation was prepared with Dragon dictation along with smaller phrase technology. Any transcriptional errors that result from this process are unintentional.  Bettey Costa Khan.D on 05/18/2019 at 9:49 AM  Between 7am to 6pm - Pager - [phone] After 6pm go to [email or web address] - password [email or web address] [place] [facility]  Office  [phone]  CC: Primary care physician; Lavera Guise, MD

## 2019-05-18 NOTE — Progress Notes (Signed)
Discharge instructions reviewed with patient including followup visits and new medications.  Understanding was verbalized and all questions were answered.  IV removed without complication; patient tolerated well.  Patient discharged home via wheelchair in stable condition escorted by nursing staff.  

## 2019-05-18 NOTE — H&P (View-Only) (Signed)
Urology Consult Follow Up  Subjective: Pam Khan is a 48 y.o. female who is POD1 from cystoscopy with stent placement for an obstructing left proximal ureteral stone with urosepsis.  Today, patient appears well. She denies pain and states she has not required pain medication since yesterday. Foley is still in place and urine output is yellow and translucent.   Overall, patient's labs have improved since yesterday, with creatinine at 0.89 (1.43 upon admission) and overall resolution of her elevated anion gap. WBC count has increased from 2.9 upon admission to 16.3 this morning. Vitals are stable with the patient afebrile and normotensive; heart rate remains elevated to the 110s, down from her rate at admission of 143.  Urine culture drawn in the ED was positive for >100k CFUs of Gram negative rods; sensitivities pending. Patient is currently on ceftriaxone 2g QD.  Anti-infectives: Anti-infectives (From admission, onward)   Start     Dose/Rate Route Frequency Ordered Stop   05/18/19 1000  cefTRIAXone (ROCEPHIN) 2 g in sodium chloride 0.9 % 100 mL IVPB     2 g 200 mL/hr over 30 Minutes Intravenous Every 24 hours 05/17/19 1353     05/18/19 0000  cefTRIAXone (ROCEPHIN) 2 g in sodium chloride 0.9 % 100 mL IVPB  Status:  Discontinued     2 g 200 mL/hr over 30 Minutes Intravenous Every 24 hours 05/17/19 1339 05/17/19 1353   05/17/19 2200  ciprofloxacin (CIPRO) IVPB 200 mg  Status:  Discontinued     200 mg 100 mL/hr over 60 Minutes Intravenous Every 12 hours 05/17/19 1140 05/17/19 1339   05/17/19 1145  cefTRIAXone (ROCEPHIN) 2 g in sodium chloride 0.9 % 100 mL IVPB     2 g 200 mL/hr over 30 Minutes Intravenous  Once 05/17/19 1132 05/17/19 1457   05/17/19 0845  ciprofloxacin (CIPRO) IVPB 400 mg     400 mg 200 mL/hr over 60 Minutes Intravenous  Once 05/17/19 0839 05/17/19 0935      Current Facility-Administered Medications  Medication Dose Route Frequency Provider Last Rate Last Dose  .  0.9 %  sodium chloride infusion   Intravenous Continuous Alford HighlandWieting, Richard, MD 75 mL/hr at 05/18/19 0835    . acetaminophen (TYLENOL) tablet 650 mg  650 mg Oral Q6H PRN Alford HighlandWieting, Richard, MD       Or  . acetaminophen (TYLENOL) suppository 650 mg  650 mg Rectal Q6H PRN Alford HighlandWieting, Richard, MD      . ALPRAZolam Prudy Feeler(XANAX) tablet 0.25 mg  0.25 mg Oral BID PRN Alford HighlandWieting, Richard, MD      . cefTRIAXone (ROCEPHIN) 2 g in sodium chloride 0.9 % 100 mL IVPB  2 g Intravenous Q24H Bertram SavinSimpson, Michael L, RPH      . diltiazem (CARDIZEM CD) 24 hr capsule 180 mg  180 mg Oral Daily Alford HighlandWieting, Richard, MD   180 mg at 05/18/19 [id number]  . insulin aspart (novoLOG) injection 0-5 Units  0-5 Units Subcutaneous QHS Alford HighlandWieting, Richard, MD   2 Units at 05/17/19 2202  . insulin aspart (novoLOG) injection 0-9 Units  0-9 Units Subcutaneous TID WC Alford HighlandWieting, Richard, MD   1 Units at 05/18/19 [id number]  . morphine 2 MG/ML injection 2 mg  2 mg Intravenous Q4H PRN Wieting, Richard, MD      . ondansetron [facility](ZOFRAN) tablet 4 mg  4 mg Oral Q6H PRN Wieting, Richard, MD       Or  . ondansetron (ZOFRAN) injection 4 mg  4 mg Intravenous Q6H PRN Alford HighlandWieting, Richard, MD  4 mg at 05/17/19 1130  . oxyCODONE (Oxy IR/ROXICODONE) immediate release tablet 5 mg  5 mg Oral Q4H PRN Wieting, Richard, MD      . pantoprazole (PROTONIX) EC tablet 40 mg  40 mg Oral Daily Alford HighlandWieting, Richard, MD   40 mg at 05/18/19 0823  . tamsulosin (FLOMAX) capsule 0.4 mg  0.4 mg Oral Daily Alford HighlandWieting, Richard, MD   0.4 mg at 05/18/19 [id number]   Objective: Vital signs in last 24 hours: Temp:  [97.9 F (36.6 C)-99.2 F (37.3 C)] 98.7 F (37.1 C) (07/10 0522) Pulse Rate:  [90-122] 90 (07/10 0522) Resp:  [16-31] 18 (07/10 0522) BP: (90-133)/(55-83) 114/65 (07/10 0522) SpO2:  [91 %-100 %] 98 % (07/10 0522) Weight:  [73.5 kg] 73.5 kg (07/09 1608)  Intake/Output from previous day: 07/09 0701 - 07/10 0700 In: 3572.7 [P.O.:240; I.V.:3200; IV Piggyback:132.7] Out: 1995 [Urine:1995] Intake/Output this  shift: No intake/output data recorded.   Physical Exam Vitals signs reviewed.  Constitutional:      General: She is not in acute distress.    Appearance: She is well-developed and normal weight. She is not ill-appearing, toxic-appearing or diaphoretic.  HENT:     Head: Normocephalic and atraumatic.  Eyes:     Extraocular Movements: Extraocular movements intact.     Conjunctiva/sclera: Conjunctivae normal.     Pupils: Pupils are equal, round, and reactive to light.  Cardiovascular:     Rate and Rhythm: Tachycardia present.  Pulmonary:     Effort: Pulmonary effort is normal. No respiratory distress.  Abdominal:     General: There is no distension.  Neurological:     Mental Status: She is alert.  Psychiatric:        Mood and Affect: Mood normal. Mood is not anxious or depressed.        Behavior: Behavior normal.        Thought Content: Thought content normal.        Judgment: Judgment normal.     Lab Results:  Recent Labs    05/17/19 0807 05/18/19 0451  WBC 2.9* 16.3*  HGB 15.1* 11.8*  HCT 44.0 34.4*  PLT 242 198   BMET Recent Labs    05/17/19 0807 05/18/19 0451  NA 134* 139  K 3.8 4.1  CL 100 110  CO2 16* 22  GLUCOSE 228* 158*  BUN 25* 20  CREATININE 1.43* 0.89  CALCIUM 9.2 8.1*   Urinalysis    Component Value Date/Time   COLORURINE YELLOW (A) 05/17/2019 0807   APPEARANCEUR CLOUDY (A) 05/17/2019 0807   APPEARANCEUR Clear 06/28/2018 0000   LABSPEC 1.023 05/17/2019 0807   PHURINE 6.0 05/17/2019 0807   GLUCOSEU NEGATIVE 05/17/2019 0807   HGBUR MODERATE (A) 05/17/2019 0807   BILIRUBINUR NEGATIVE 05/17/2019 0807   BILIRUBINUR Negative 06/28/2018 0000   KETONESUR NEGATIVE 05/17/2019 0807   PROTEINUR 30 (A) 05/17/2019 0807   NITRITE POSITIVE (A) 05/17/2019 0807   LEUKOCYTESUR MODERATE (A) 05/17/2019 0807   Results for orders placed or performed during the hospital encounter of 05/17/19  Urine Culture     Status: Abnormal (Preliminary result)    Collection Time: 05/17/19  8:07 AM   Specimen: Urine, Clean Catch  Result Value Ref Range Status   Specimen Description   Final    URINE, CLEAN CATCH Performed at [facility], [address] [place], [place] [address]    Special Requests   Final    Normal Performed at [facility], [address] [place], [place]  [address]    Culture >=100,000 COLONIES/mL GRAM NEGATIVE RODS (A)  Final   Report Status PENDING  Incomplete  SARS Coronavirus 2 (CEPHEID - Performed in [facility] lab), Hosp Order     Status: None   Collection Time: 05/17/19 10:00 AM   Specimen: Nasopharyngeal Swab  Result Value Ref Range Status   SARS Coronavirus 2 NEGATIVE NEGATIVE Final    Comment: (NOTE) If result is NEGATIVE SARS-CoV-2 target nucleic acids are NOT DETECTED. The SARS-CoV-2 RNA is generally detectable in upper and lower  respiratory specimens during the acute phase of infection. The lowest  concentration of SARS-CoV-2 viral copies this assay can detect is 250  copies / mL. A negative result does not preclude SARS-CoV-2 infection  and should not be used as the sole basis for treatment or other  patient management decisions.  A negative result may occur with  improper specimen collection / handling, submission of specimen other  than nasopharyngeal swab, presence of viral mutation(s) within the  areas targeted by this assay, and inadequate number of viral copies  (<250 copies / mL). A negative result must be combined with clinical  observations, patient history, and epidemiological information. If result is POSITIVE SARS-CoV-2 target nucleic acids are DETECTED. The SARS-CoV-2 RNA is generally detectable in upper and lower  respiratory specimens dur ing the acute phase of infection.  Positive  results are indicative of active infection with SARS-CoV-2.  Clinical  correlation with patient history and other diagnostic information is  necessary to determine patient  infection status.  Positive results do  not rule out bacterial infection or co-infection with other viruses. If result is PRESUMPTIVE POSTIVE SARS-CoV-2 nucleic acids MAY BE PRESENT.   A presumptive positive result was obtained on the submitted specimen  and confirmed on repeat testing.  While 2019 novel coronavirus  (SARS-CoV-2) nucleic acids may be present in the submitted sample  additional confirmatory testing may be necessary for epidemiological  and / or clinical management purposes  to differentiate between  SARS-CoV-2 and other Sarbecovirus currently known to infect humans.  If clinically indicated additional testing with an alternate test  methodology [phone]) is advised. The SARS-CoV-2 RNA is generally  detectable in upper and lower respiratory sp ecimens during the acute  phase of infection. The expected result is Negative. Fact Sheet for Patients:  StrictlyIdeas.no Fact Sheet for Healthcare Providers: BankingDealers.co.za This test is not yet approved or cleared by the [place] FDA and has been authorized for detection and/or diagnosis of SARS-CoV-2 by FDA under an Emergency Use Authorization (EUA).  This EUA will remain in effect (meaning this test can be used) for the duration of the COVID-19 declaration under Section 564(b)(1) of the Act, 21 U.S.C. section 360bbb-3(b)(1), unless the authorization is terminated or revoked sooner. Performed at [facility], [address] [place], [place] [address]    Assessment & Plan: Patient appears to be improving clinically. Increasing WBC count presumed to be reactive to surgical manipulation. Patient is cleared for DC from urological standpoint. Prefer oral Bactrim x10 days pending sensitivities; will adjust antimicrobial coverage if necessary.  Counseled patient on typical stent pain and hematuria. Informed her that she should return to the hospital if she develops  new fever. Informed patient of plan to proceed with ureteroscopy in 2 weeks to remove the obstructing stone. Patient is agreeable with this plan.  -D/C Foley -May be discharged home on Bactrim x10 days -Schedule ureteroscopy in 2 weeks for stone removal  Debroah Loop,  PA-C 05/18/2019

## 2019-05-18 NOTE — Progress Notes (Signed)
Urology Consult Follow Up  Subjective: Pam Khan is a 48 y.o. female who is POD1 from cystoscopy with stent placement for an obstructing left proximal ureteral stone with urosepsis.  Today, patient appears well. She denies pain and states she has not required pain medication since yesterday. Foley is still in place and urine output is yellow and translucent.   Overall, patient's labs have improved since yesterday, with creatinine at 0.89 (1.43 upon admission) and overall resolution of her elevated anion gap. WBC count has increased from 2.9 upon admission to 16.3 this morning. Vitals are stable with the patient afebrile and normotensive; heart rate remains elevated to the 110s, down from her rate at admission of 143.  Urine culture drawn in the ED was positive for >100k CFUs of Gram negative rods; sensitivities pending. Patient is currently on ceftriaxone 2g QD.  Anti-infectives: Anti-infectives (From admission, onward)   Start     Dose/Rate Route Frequency Ordered Stop   05/18/19 1000  cefTRIAXone (ROCEPHIN) 2 g in sodium chloride 0.9 % 100 mL IVPB     2 g 200 mL/hr over 30 Minutes Intravenous Every 24 hours 05/17/19 1353     05/18/19 0000  cefTRIAXone (ROCEPHIN) 2 g in sodium chloride 0.9 % 100 mL IVPB  Status:  Discontinued     2 g 200 mL/hr over 30 Minutes Intravenous Every 24 hours 05/17/19 1339 05/17/19 1353   05/17/19 2200  ciprofloxacin (CIPRO) IVPB 200 mg  Status:  Discontinued     200 mg 100 mL/hr over 60 Minutes Intravenous Every 12 hours 05/17/19 1140 05/17/19 1339   05/17/19 1145  cefTRIAXone (ROCEPHIN) 2 g in sodium chloride 0.9 % 100 mL IVPB     2 g 200 mL/hr over 30 Minutes Intravenous  Once 05/17/19 1132 05/17/19 1457   05/17/19 0845  ciprofloxacin (CIPRO) IVPB 400 mg     400 mg 200 mL/hr over 60 Minutes Intravenous  Once 05/17/19 0839 05/17/19 0935      Current Facility-Administered Medications  Medication Dose Route Frequency Provider Last Rate Last Dose  .  0.9 %  sodium chloride infusion   Intravenous Continuous Alford HighlandWieting, Richard, MD 75 mL/hr at 05/18/19 0835    . acetaminophen (TYLENOL) tablet 650 mg  650 mg Oral Q6H PRN Alford HighlandWieting, Richard, MD       Or  . acetaminophen (TYLENOL) suppository 650 mg  650 mg Rectal Q6H PRN Alford HighlandWieting, Richard, MD      . ALPRAZolam Prudy Feeler(XANAX) tablet 0.25 mg  0.25 mg Oral BID PRN Alford HighlandWieting, Richard, MD      . cefTRIAXone (ROCEPHIN) 2 g in sodium chloride 0.9 % 100 mL IVPB  2 g Intravenous Q24H Bertram SavinSimpson, Michael L, RPH      . diltiazem (CARDIZEM CD) 24 hr capsule 180 mg  180 mg Oral Daily Alford HighlandWieting, Richard, MD   180 mg at 05/18/19 [id number]  . insulin aspart (novoLOG) injection 0-5 Units  0-5 Units Subcutaneous QHS Alford HighlandWieting, Richard, MD   2 Units at 05/17/19 2202  . insulin aspart (novoLOG) injection 0-9 Units  0-9 Units Subcutaneous TID WC Alford HighlandWieting, Richard, MD   1 Units at 05/18/19 [id number]  . morphine 2 MG/ML injection 2 mg  2 mg Intravenous Q4H PRN Wieting, Richard, MD      . ondansetron [facility](ZOFRAN) tablet 4 mg  4 mg Oral Q6H PRN Wieting, Richard, MD       Or  . ondansetron (ZOFRAN) injection 4 mg  4 mg Intravenous Q6H PRN Alford HighlandWieting, Richard, MD  4 mg at 05/17/19 1130  . oxyCODONE (Oxy IR/ROXICODONE) immediate release tablet 5 mg  5 mg Oral Q4H PRN Wieting, Richard, MD      . pantoprazole (PROTONIX) EC tablet 40 mg  40 mg Oral Daily Wieting, Richard, MD   40 mg at 05/18/19 0823  . tamsulosin (FLOMAX) capsule 0.4 mg  0.4 mg Oral Daily Wieting, Richard, MD   0.4 mg at 05/18/19 0823   Objective: Vital signs in last 24 hours: Temp:  [97.9 F (36.6 C)-99.2 F (37.3 C)] 98.7 F (37.1 C) (07/10 0522) Pulse Rate:  [90-122] 90 (07/10 0522) Resp:  [16-31] 18 (07/10 0522) BP: (90-133)/(55-83) 114/65 (07/10 0522) SpO2:  [91 %-100 %] 98 % (07/10 0522) Weight:  [73.5 kg] 73.5 kg (07/09 1608)  Intake/Output from previous day: 07/09 0701 - 07/10 0700 In: 3572.7 [P.O.:240; I.V.:3200; IV Piggyback:132.7] Out: 1995 [Urine:1995] Intake/Output this  shift: No intake/output data recorded.   Physical Exam Vitals signs reviewed.  Constitutional:      General: She is not in acute distress.    Appearance: She is well-developed and normal weight. She is not ill-appearing, toxic-appearing or diaphoretic.  HENT:     Head: Normocephalic and atraumatic.  Eyes:     Extraocular Movements: Extraocular movements intact.     Conjunctiva/sclera: Conjunctivae normal.     Pupils: Pupils are equal, round, and reactive to light.  Cardiovascular:     Rate and Rhythm: Tachycardia present.  Pulmonary:     Effort: Pulmonary effort is normal. No respiratory distress.  Abdominal:     General: There is no distension.  Neurological:     Mental Status: She is alert.  Psychiatric:        Mood and Affect: Mood normal. Mood is not anxious or depressed.        Behavior: Behavior normal.        Thought Content: Thought content normal.        Judgment: Judgment normal.     Lab Results:  Recent Labs    05/17/19 0807 05/18/19 0451  WBC 2.9* 16.3*  HGB 15.1* 11.8*  HCT 44.0 34.4*  PLT 242 198   BMET Recent Labs    05/17/19 0807 05/18/19 0451  NA 134* 139  K 3.8 4.1  CL 100 110  CO2 16* 22  GLUCOSE 228* 158*  BUN 25* 20  CREATININE 1.43* 0.89  CALCIUM 9.2 8.1*   Urinalysis    Component Value Date/Time   COLORURINE YELLOW (A) 05/17/2019 0807   APPEARANCEUR CLOUDY (A) 05/17/2019 0807   APPEARANCEUR Clear 06/28/2018 0000   LABSPEC 1.023 05/17/2019 0807   PHURINE 6.0 05/17/2019 0807   GLUCOSEU NEGATIVE 05/17/2019 0807   HGBUR MODERATE (A) 05/17/2019 0807   BILIRUBINUR NEGATIVE 05/17/2019 0807   BILIRUBINUR Negative 06/28/2018 0000   KETONESUR NEGATIVE 05/17/2019 0807   PROTEINUR 30 (A) 05/17/2019 0807   NITRITE POSITIVE (A) 05/17/2019 0807   LEUKOCYTESUR MODERATE (A) 05/17/2019 0807   Results for orders placed or performed during the hospital encounter of 05/17/19  Urine Culture     Status: Abnormal (Preliminary result)    Collection Time: 05/17/19  8:07 AM   Specimen: Urine, Clean Catch  Result Value Ref Range Status   Specimen Description   Final    URINE, CLEAN CATCH Performed at [facility], [address] [place], [place] [address]    Special Requests   Final    Normal Performed at [facility], [address] , [place]   [address]    Culture >=100,000 COLONIES/mL GRAM NEGATIVE RODS (A)  Final   Report Status PENDING  Incomplete  SARS Coronavirus 2 (CEPHEID - Performed in [facility] lab), Hosp Order     Status: None   Collection Time: 05/17/19 10:00 AM   Specimen: Nasopharyngeal Swab  Result Value Ref Range Status   SARS Coronavirus 2 NEGATIVE NEGATIVE Final    Comment: (NOTE) If result is NEGATIVE SARS-CoV-2 target nucleic acids are NOT DETECTED. The SARS-CoV-2 RNA is generally detectable in upper and lower  respiratory specimens during the acute phase of infection. The lowest  concentration of SARS-CoV-2 viral copies this assay can detect is 250  copies / mL. A negative result does not preclude SARS-CoV-2 infection  and should not be used as the sole basis for treatment or other  patient management decisions.  A negative result may occur with  improper specimen collection / handling, submission of specimen other  than nasopharyngeal swab, presence of viral mutation(s) within the  areas targeted by this assay, and inadequate number of viral copies  (<250 copies / mL). A negative result must be combined with clinical  observations, patient history, and epidemiological information. If result is POSITIVE SARS-CoV-2 target nucleic acids are DETECTED. The SARS-CoV-2 RNA is generally detectable in upper and lower  respiratory specimens dur ing the acute phase of infection.  Positive  results are indicative of active infection with SARS-CoV-2.  Clinical  correlation with patient history and other diagnostic information is  necessary to determine patient  infection status.  Positive results do  not rule out bacterial infection or co-infection with other viruses. If result is PRESUMPTIVE POSTIVE SARS-CoV-2 nucleic acids MAY BE PRESENT.   A presumptive positive result was obtained on the submitted specimen  and confirmed on repeat testing.  While 2019 novel coronavirus  (SARS-CoV-2) nucleic acids may be present in the submitted sample  additional confirmatory testing may be necessary for epidemiological  and / or clinical management purposes  to differentiate between  SARS-CoV-2 and other Sarbecovirus currently known to infect humans.  If clinically indicated additional testing with an alternate test  methodology [phone]) is advised. The SARS-CoV-2 RNA is generally  detectable in upper and lower respiratory sp ecimens during the acute  phase of infection. The expected result is Negative. Fact Sheet for Patients:  StrictlyIdeas.no Fact Sheet for Healthcare Providers: BankingDealers.co.za This test is not yet approved or cleared by the [place] FDA and has been authorized for detection and/or diagnosis of SARS-CoV-2 by FDA under an Emergency Use Authorization (EUA).  This EUA will remain in effect (meaning this test can be used) for the duration of the COVID-19 declaration under Section 564(b)(1) of the Act, 21 U.S.C. section 360bbb-3(b)(1), unless the authorization is terminated or revoked sooner. Performed at [facility], [address] [place], [place] [address]    Assessment & Plan: Patient appears to be improving clinically. Increasing WBC count presumed to be reactive to surgical manipulation. Patient is cleared for DC from urological standpoint. Prefer oral Bactrim x10 days pending sensitivities; will adjust antimicrobial coverage if necessary.  Counseled patient on typical stent pain and hematuria. Informed her that she should return to the hospital if she develops  new fever. Informed patient of plan to proceed with ureteroscopy in 2 weeks to remove the obstructing stone. Patient is agreeable with this plan.  -D/C Foley -May be discharged home on Bactrim x10 days -Schedule ureteroscopy in 2 weeks for stone removal  Debroah Loop,  PA-C 05/18/2019

## 2019-05-19 LAB — URINE CULTURE
Culture: >=100000 — AB
Special Requests: NORMAL

## 2019-05-19 LAB — HIV ANTIBODY (ROUTINE TESTING W REFLEX): HIV Screen 4th Generation wRfx: NONREACTIVE

## 2019-05-21 ENCOUNTER — Telehealth: Payer: Self-pay

## 2019-05-21 ENCOUNTER — Ambulatory Visit: Payer: Commercial Managed Care - PPO | Admitting: Nurse Practitioner

## 2019-05-21 ENCOUNTER — Encounter: Admission: RE | Admit: 2019-05-21 | Payer: Commercial Managed Care - PPO | Source: Ambulatory Visit

## 2019-05-21 HISTORY — DX: Other intervertebral disc degeneration, lumbar region without mention of lumbar back pain or lower extremity pain: M51.369

## 2019-05-21 HISTORY — DX: Other intervertebral disc degeneration, lumbar region: M51.36

## 2019-05-21 HISTORY — DX: Spinal stenosis, lumbar region without neurogenic claudication: M48.061

## 2019-05-21 HISTORY — DX: Gastro-esophageal reflux disease without esophagitis: K21.9

## 2019-05-21 NOTE — Telephone Encounter (Signed)
Error

## 2019-05-21 NOTE — Progress Notes (Signed)
Can u check with pt if she is given antibiotics

## 2019-05-21 NOTE — Discharge Instructions (Signed)
INSTRUCTIONS FOR SURGERY     Your surgery is scheduled for:   Friday, July 17TH     To find out your arrival time for the day of surgery,          please call [phone] between 1 pm and 3 pm on :  Thursday, July 16TH     When you arrive for surgery, report to the SECOND FLOOR OF THE MEDICAL MALL.       Do NOT stop on the first floor to register.    REMEMBER: Instructions that are not followed completely may result in serious medical risk,  up to and including death, or upon the discretion of your surgeon and anesthesiologist,            your surgery may need to be rescheduled.  __X__ 1. Do not eat food after midnight the night before your procedure.                    No gum, candy, lozenger, tic tacs, tums or hard candies.                  ABSOLUTELY NOTHING SOLID IN YOUR MOUTH AFTER MIDNIGHT                    You may drink unlimited clear liquids up to 2 hours before you are scheduled to arrive for surgery.                   Do not drink anything within those 2 hours unless you need to take medicine, then take the                   smallest amount you need.  Clear liquids include:  water, apple juice without pulp,                   any flavor Gatorade, Black coffee, black tea.  Sugar may be added but no dairy/ honey /lemon.                        Broth and jello is not considered a clear liquid.  __x__  2. On the morning of surgery, please brush your teeth with toothpaste and water. You may rinse with                  mouthwash if you wish but DO NOT SWALLOW TOOTHPASTE OR MOUTHWASH  __X___3. NO alcohol for 24 hours before or after surgery.  __x___ 4.  Do NOT smoke or use e-cigarettes for 24 HOURS PRIOR TO SURGERY.                      DO NOT Use any chewable tobacco products for at least 6 hours prior to surgery.  __x___ 5. If you start any new medication after this appointment and prior to surgery, please   Bring it with you on the day of surgery.  ___x__ 6. Notify your doctor if there is any change in your medical condition, such as fever, infection, vomitting,  Diarrhea or any open sores.  __x___ 7.  USE ANTIBACTERIAL SOAP the night before surgery and the day of surgery.                   Once you have washed with this soap, do NOT use any of the following: Powders, perfumes                    or lotions. Please do not wear make up, hairpins, clips or nail polish. You MAY wear deodorant.                   Men may shave their face and neck.  Women need to shave 48 hours prior to surgery.                   DO NOT wear ANY jewelry on the day of surgery. If there are rings that are too tight to                    remove easily, please address this prior to the surgery day. Piercings need to be removed.                                                                     NO METAL ON YOUR BODY.                    Do NOT bring any valuables.  If you came to Pre-Admit testing then you will not need license,                     insurance card or credit card.  If you will be staying overnight, please either leave your things in                     the car or have your family be responsible for these items.                     [facility] IS NOT RESPONSIBLE FOR BELONGINGS OR VALUABLES.  ___X__ 8. DO NOT wear contact lenses on surgery day.  You may not have dentures,                     Hearing aides, contacts or glasses in the operating room. These items can be                    Placed in the Recovery Room to receive immediately after surgery.  __x___ 9. IF YOU ARE SCHEDULED TO GO HOME ON THE SAME DAY, YOU MUST                   Have someone to drive you home and to stay with you  for the first 24 hours.                    Have an arrangement prior to arriving on surgery day.  ___x__ 10. Take the following medications on the morning of surgery with a sip of water:  1.  XANAX                     2.  OMEPRAZOLE                     3.  TRAMADOL FOR PAIN, IF NEEDED                     4.  BACTRIM                     5.                     6.  _____ 11.  Follow any instructions provided to you by your surgeon.                        Such as enema, clear liquid bowel prep  __X__  12. STOP COUMADIN / PLAVIX / ELIQUIS / ASPIRIN AS OF: May 21, 2019                       THIS INCLUDES BC POWDERS / GOODIES POWDER  __x___ 13. STOP Anti-inflammatories as of:  May 21, 2019                      This includes IBUPROFEN / MOTRIN / ADVIL / ALEVE/ NAPROXYN                    YOU MAY TAKE TYLENOL ANY TIME PRIOR TO SURGERY.  ______ 15. Bring your CPAP machine into preop with you on the morning of surgery.  __X____16.  Continue to take the following medications but do not take on the morning of surgery:                           [place]  ______18. If staying overnight, please have appropriate shoes to wear to be able to walk around the unit.                   Wear clean and comfortable clothing to the hospital.

## 2019-05-22 ENCOUNTER — Other Ambulatory Visit
Admission: RE | Admit: 2019-05-22 | Discharge: 2019-05-22 | Disposition: A | Discharge status: Home / Self Care | Payer: Commercial Managed Care - PPO | Source: Ambulatory Visit | Attending: Urology | Admitting: Urology

## 2019-05-22 ENCOUNTER — Inpatient Hospital Stay: Admission: RE | Admit: 2019-05-22 | Payer: Commercial Managed Care - PPO | Source: Ambulatory Visit

## 2019-05-22 ENCOUNTER — Other Ambulatory Visit: Payer: Self-pay

## 2019-05-22 DIAGNOSIS — Z1159 Encounter for screening for other viral diseases: Secondary | ICD-10-CM | POA: Diagnosis not present

## 2019-05-22 LAB — CBC
HCT: 42.4 % (ref 36.0–46.0)
Hemoglobin: 14.8 g/dL (ref 12.0–15.0)
MCH: 31.1 pg (ref 26.0–34.0)
MCHC: 34.9 g/dL (ref 30.0–36.0)
MCV: 89.1 fL (ref 80.0–100.0)
Platelets: 360 10*3/uL (ref 150–400)
RBC: 4.76 MIL/uL (ref 3.87–5.11)
RDW: 12 % (ref 11.5–15.5)
WBC: 12.4 10*3/uL — ABNORMAL HIGH (ref 4.0–10.5)
nRBC: 0 % (ref 0.0–0.2)

## 2019-05-22 LAB — BASIC METABOLIC PANEL
Anion gap: 11 (ref 5–15)
BUN: 18 mg/dL (ref 6–20)
CO2: 21 mmol/L — ABNORMAL LOW (ref 22–32)
Calcium: 9.3 mg/dL (ref 8.9–10.3)
Chloride: 105 mmol/L (ref 98–111)
Creatinine, Ser: 1.14 mg/dL — ABNORMAL HIGH (ref 0.44–1.00)
GFR calc Af Amer: >60 mL/min (ref >60)
GFR calc non Af Amer: 56 mL/min — ABNORMAL LOW (ref >60)
Glucose, Bld: 131 mg/dL — ABNORMAL HIGH (ref 70–99)
Potassium: 3.8 mmol/L (ref 3.5–5.1)
Sodium: 137 mmol/L (ref 135–145)

## 2019-05-22 LAB — SARS CORONAVIRUS 2 (TAT 6-24 HRS): SARS Coronavirus 2: NEGATIVE

## 2019-05-23 ENCOUNTER — Other Ambulatory Visit: Payer: Self-pay

## 2019-05-23 ENCOUNTER — Encounter: Payer: Self-pay | Admitting: *Deleted

## 2019-05-23 ENCOUNTER — Encounter
Admission: RE | Admit: 2019-05-23 | Discharge: 2019-05-23 | Disposition: A | Discharge status: Home / Self Care | Payer: Commercial Managed Care - PPO | Source: Ambulatory Visit | Attending: Urology | Admitting: Urology

## 2019-05-24 ENCOUNTER — Encounter: Payer: Self-pay | Admitting: Anesthesiology

## 2019-05-24 ENCOUNTER — Telehealth: Payer: Self-pay | Admitting: Radiology

## 2019-05-24 MED ORDER — CEFAZOLIN SODIUM-DEXTROSE 1-4 GM/50ML-% IV SOLN
1.0000 g | INTRAVENOUS | Status: AC
  Administered 2019-05-25: 08:00:00 1 g via INTRAVENOUS

## 2019-05-24 NOTE — Telephone Encounter (Signed)
Patient reports severe pain with vomiting & chills yesterday that lasted approximately 3 hours. States she "feels fine today". She asks if she could have passed the stone or developed a new one. She has not seen a stone pass. She is scheduled for ureteroscopy tomorrow, 05/25/2019. Please advise.

## 2019-05-24 NOTE — Telephone Encounter (Signed)
Patient states she is still taking the antibiotic and confirmed pain is on the left side. Per Dr Erlene Quan, advised patient that surgery will proceed as planned. Patient expresses understanding of conversation.

## 2019-05-25 ENCOUNTER — Ambulatory Visit: Payer: Commercial Managed Care - PPO | Admitting: Anesthesiology

## 2019-05-25 ENCOUNTER — Encounter
Admission: RE | Disposition: A | Discharge status: Home / Self Care | Payer: Self-pay | Source: Home / Self Care | Attending: Urology

## 2019-05-25 ENCOUNTER — Encounter: Payer: Self-pay | Admitting: *Deleted

## 2019-05-25 ENCOUNTER — Ambulatory Visit
Admission: RE | Admit: 2019-05-25 | Discharge: 2019-05-25 | Disposition: A | Discharge status: Home / Self Care | Payer: Commercial Managed Care - PPO | Attending: Urology | Admitting: Urology

## 2019-05-25 ENCOUNTER — Other Ambulatory Visit: Payer: Self-pay

## 2019-05-25 DIAGNOSIS — K219 Gastro-esophageal reflux disease without esophagitis: Secondary | ICD-10-CM | POA: Insufficient documentation

## 2019-05-25 DIAGNOSIS — J45909 Unspecified asthma, uncomplicated: Secondary | ICD-10-CM | POA: Insufficient documentation

## 2019-05-25 DIAGNOSIS — R7301 Impaired fasting glucose: Secondary | ICD-10-CM | POA: Insufficient documentation

## 2019-05-25 DIAGNOSIS — I10 Essential (primary) hypertension: Secondary | ICD-10-CM | POA: Diagnosis not present

## 2019-05-25 DIAGNOSIS — Z794 Long term (current) use of insulin: Secondary | ICD-10-CM | POA: Diagnosis not present

## 2019-05-25 DIAGNOSIS — M1990 Unspecified osteoarthritis, unspecified site: Secondary | ICD-10-CM | POA: Insufficient documentation

## 2019-05-25 DIAGNOSIS — R000 Tachycardia, unspecified: Secondary | ICD-10-CM | POA: Insufficient documentation

## 2019-05-25 DIAGNOSIS — N200 Calculus of kidney: Secondary | ICD-10-CM

## 2019-05-25 DIAGNOSIS — F419 Anxiety disorder, unspecified: Secondary | ICD-10-CM | POA: Diagnosis not present

## 2019-05-25 DIAGNOSIS — N201 Calculus of ureter: Secondary | ICD-10-CM

## 2019-05-25 DIAGNOSIS — N202 Calculus of kidney with calculus of ureter: Secondary | ICD-10-CM | POA: Diagnosis present

## 2019-05-25 DIAGNOSIS — Z79899 Other long term (current) drug therapy: Secondary | ICD-10-CM | POA: Insufficient documentation

## 2019-05-25 HISTORY — PX: CYSTOSCOPY/URETEROSCOPY/HOLMIUM LASER/STENT PLACEMENT: SHX6546

## 2019-05-25 HISTORY — DX: Family history of other specified conditions: Z84.89

## 2019-05-25 LAB — POCT PREGNANCY, URINE: Preg Test, Ur: NEGATIVE

## 2019-05-25 SURGERY — CYSTOSCOPY/URETEROSCOPY/HOLMIUM LASER/STENT PLACEMENT
Anesthesia: General | Site: Ureter | Laterality: Left

## 2019-05-25 MED ORDER — LACTATED RINGERS IV SOLN
INTRAVENOUS | Status: DC | PRN
  Administered 2019-05-25: 08:00:00 via INTRAVENOUS

## 2019-05-25 MED ORDER — PHENYLEPHRINE HCL (PRESSORS) 10 MG/ML IV SOLN
INTRAVENOUS | Status: AC
  Filled 2019-05-25: qty 1

## 2019-05-25 MED ORDER — GLYCOPYRROLATE 0.2 MG/ML IJ SOLN
INTRAMUSCULAR | Status: AC
  Filled 2019-05-25: qty 1

## 2019-05-25 MED ORDER — ONDANSETRON HCL 4 MG/2ML IJ SOLN
INTRAMUSCULAR | Status: DC | PRN
  Administered 2019-05-25: 4 mg via INTRAVENOUS

## 2019-05-25 MED ORDER — FAMOTIDINE 20 MG PO TABS
20.0000 mg | ORAL_TABLET | Freq: Once | ORAL | Status: AC
  Administered 2019-05-25: 20 mg via ORAL

## 2019-05-25 MED ORDER — PROPOFOL 10 MG/ML IV BOLUS
INTRAVENOUS | Status: AC
  Filled 2019-05-25: qty 40

## 2019-05-25 MED ORDER — FENTANYL CITRATE (PF) 100 MCG/2ML IJ SOLN
INTRAMUSCULAR | Status: DC | PRN
  Administered 2019-05-25 (×2): 100 ug via INTRAVENOUS
  Administered 2019-05-25: 50 ug via INTRAVENOUS

## 2019-05-25 MED ORDER — FENTANYL CITRATE (PF) 100 MCG/2ML IJ SOLN: 25.0000 ug | INTRAMUSCULAR | Status: DC | PRN

## 2019-05-25 MED ORDER — SUCCINYLCHOLINE CHLORIDE 20 MG/ML IJ SOLN
INTRAMUSCULAR | Status: DC | PRN
  Administered 2019-05-25: 80 mg via INTRAVENOUS

## 2019-05-25 MED ORDER — ONDANSETRON HCL 4 MG/2ML IJ SOLN
INTRAMUSCULAR | Status: AC
  Filled 2019-05-25: qty 2

## 2019-05-25 MED ORDER — MIDAZOLAM HCL 2 MG/2ML IJ SOLN
INTRAMUSCULAR | Status: DC | PRN
  Administered 2019-05-25: 2 mg via INTRAVENOUS

## 2019-05-25 MED ORDER — LACTATED RINGERS IV SOLN: INTRAVENOUS | Status: DC

## 2019-05-25 MED ORDER — SUCCINYLCHOLINE CHLORIDE 20 MG/ML IJ SOLN
INTRAMUSCULAR | Status: AC
  Filled 2019-05-25: qty 1

## 2019-05-25 MED ORDER — ONDANSETRON HCL 4 MG/2ML IJ SOLN: 4.0000 mg | Freq: Once | INTRAMUSCULAR | Status: DC | PRN

## 2019-05-25 MED ORDER — DEXAMETHASONE SODIUM PHOSPHATE 10 MG/ML IJ SOLN
INTRAMUSCULAR | Status: AC
  Filled 2019-05-25: qty 1

## 2019-05-25 MED ORDER — FENTANYL CITRATE (PF) 250 MCG/5ML IJ SOLN
INTRAMUSCULAR | Status: AC
  Filled 2019-05-25: qty 5

## 2019-05-25 MED ORDER — DEXAMETHASONE SODIUM PHOSPHATE 10 MG/ML IJ SOLN
INTRAMUSCULAR | Status: DC | PRN
  Administered 2019-05-25: 10 mg via INTRAVENOUS

## 2019-05-25 MED ORDER — CEFAZOLIN SODIUM-DEXTROSE 1-4 GM/50ML-% IV SOLN
INTRAVENOUS | Status: AC
  Filled 2019-05-25: qty 50

## 2019-05-25 MED ORDER — MIDAZOLAM HCL 2 MG/2ML IJ SOLN
INTRAMUSCULAR | Status: AC
  Filled 2019-05-25: qty 2

## 2019-05-25 MED ORDER — EPHEDRINE SULFATE 50 MG/ML IJ SOLN
INTRAMUSCULAR | Status: AC
  Filled 2019-05-25: qty 1

## 2019-05-25 MED ORDER — LIDOCAINE HCL URETHRAL/MUCOSAL 2 % EX GEL
CUTANEOUS | Status: AC
  Filled 2019-05-25: qty 5

## 2019-05-25 MED ORDER — GLYCOPYRROLATE 0.2 MG/ML IJ SOLN
INTRAMUSCULAR | Status: DC | PRN
  Administered 2019-05-25: 0.2 mg via INTRAVENOUS

## 2019-05-25 MED ORDER — PROPOFOL 10 MG/ML IV BOLUS
INTRAVENOUS | Status: DC | PRN
  Administered 2019-05-25: 50 mg via INTRAVENOUS
  Administered 2019-05-25: 80 mg via INTRAVENOUS
  Administered 2019-05-25: 50 mg via INTRAVENOUS

## 2019-05-25 MED ORDER — ROCURONIUM BROMIDE 50 MG/5ML IV SOLN
INTRAVENOUS | Status: AC
  Filled 2019-05-25: qty 2

## 2019-05-25 MED ORDER — FAMOTIDINE 20 MG PO TABS
ORAL_TABLET | ORAL | Status: AC
  Filled 2019-05-25: qty 1

## 2019-05-25 MED ORDER — LIDOCAINE HCL (CARDIAC) PF 100 MG/5ML IV SOSY
PREFILLED_SYRINGE | INTRAVENOUS | Status: DC | PRN
  Administered 2019-05-25: 100 mg via INTRAVENOUS

## 2019-05-25 SURGICAL SUPPLY — 27 items
BAG DRAIN CYSTO-URO LG1000N (MISCELLANEOUS) ×2 IMPLANT
BASKET ZERO TIP 1.9FR (BASKET) ×1 IMPLANT
BRUSH SCRUB EZ 1% IODOPHOR (MISCELLANEOUS) ×2 IMPLANT
CATH URETL 5X70 OPEN END (CATHETERS) ×2 IMPLANT
CNTNR SPEC 2.5X3XGRAD LEK (MISCELLANEOUS)
CONT SPEC 4OZ STER OR WHT (MISCELLANEOUS)
CONTAINER SPEC 2.5X3XGRAD LEK (MISCELLANEOUS) IMPLANT
DRAPE UTILITY 15X26 TOWEL STRL (DRAPES) ×2 IMPLANT
DRSG TEGADERM 2-3/8X2-3/4 SM (GAUZE/BANDAGES/DRESSINGS) ×1 IMPLANT
FIBER LASER LITHO 273 (Laser) IMPLANT
GLOVE BIO SURGEON STRL SZ 6.5 (GLOVE) ×2 IMPLANT
GOWN STRL REUS W/ TWL LRG LVL3 (GOWN DISPOSABLE) ×2 IMPLANT
GOWN STRL REUS W/TWL LRG LVL3 (GOWN DISPOSABLE) ×2
GUIDEWIRE GREEN .038 145CM (MISCELLANEOUS) ×1 IMPLANT
GUIDEWIRE STR DUAL SENSOR (WIRE) ×2 IMPLANT
INFUSOR MANOMETER BAG 3000ML (MISCELLANEOUS) ×2 IMPLANT
INTRODUCER DILATOR DOUBLE (INTRODUCER) IMPLANT
KIT TURNOVER CYSTO (KITS) ×2 IMPLANT
PACK CYSTO AR (MISCELLANEOUS) ×2 IMPLANT
SET CYSTO W/LG BORE CLAMP LF (SET/KITS/TRAYS/PACK) ×2 IMPLANT
SHEATH URETERAL 12FRX35CM (MISCELLANEOUS) ×1 IMPLANT
SOL .9 NS 3000ML IRR  AL (IV SOLUTION) ×1
SOL .9 NS 3000ML IRR UROMATIC (IV SOLUTION) ×1 IMPLANT
STENT URET 6FRX24 CONTOUR (STENTS) ×1 IMPLANT
STENT URET 6FRX26 CONTOUR (STENTS) IMPLANT
SURGILUBE 2OZ TUBE FLIPTOP (MISCELLANEOUS) ×2 IMPLANT
WATER STERILE IRR 1000ML POUR (IV SOLUTION) ×2 IMPLANT

## 2019-05-25 NOTE — Interval H&P Note (Signed)
History and Physical Interval Note:  05/25/2019 7:27 AM  Pam Khan  has presented today for surgery, with the diagnosis of left ureteral stone.  The various methods of treatment have been discussed with the patient and family. After consideration of risks, benefits and other options for treatment, the patient has consented to  Procedure(s): CYSTOSCOPY/URETEROSCOPY/HOLMIUM LASER/STENT Exchange (Left) as a surgical intervention.  The patient's history has been reviewed, patient examined, no change in status, stable for surgery.  I have reviewed the patient's chart and labs.  Questions were answered to the patient's satisfaction.    RRR CTAB  Appropriately on abx   Hollice Espy

## 2019-05-25 NOTE — Anesthesia Postprocedure Evaluation (Signed)
Anesthesia Post Note  Patient: Pam Khan  Procedure(s) Performed: CYSTOSCOPY/URETEROSCOPY/STENT Exchange (Left Ureter)  Patient location during evaluation: PACU Anesthesia Type: General Level of consciousness: awake and alert Pain management: pain level controlled Vital Signs Assessment: post-procedure vital signs reviewed and stable Respiratory status: spontaneous breathing, nonlabored ventilation, respiratory function stable and patient connected to nasal cannula oxygen Cardiovascular status: blood pressure returned to baseline and stable Postop Assessment: no apparent nausea or vomiting Anesthetic complications: no     Last Vitals:  Vitals:   05/25/19 0905 05/25/19 0930  BP: 138/74 135/71  Pulse: 80 77  Resp: 16 18  Temp: 36.4 C   SpO2: 96% 96%    Last Pain:  Vitals:   05/25/19 0930  TempSrc:   PainSc: 0-No pain                 [name]

## 2019-05-25 NOTE — Anesthesia Preprocedure Evaluation (Addendum)
Anesthesia Evaluation  Patient identified by MRN, date of birth, ID band Patient awake    Reviewed: Allergy & Precautions, NPO status , Patient's Chart, lab work & pertinent test results, reviewed documented beta blocker date and time   History of Anesthesia Complications (+) Family history of anesthesia reaction  Airway Mallampati: II  TM Distance: >3 FB     Dental  (+) Chipped   Pulmonary asthma ,           Cardiovascular hypertension, Pt. on medications      Neuro/Psych Anxiety    GI/Hepatic GERD  Controlled,  Endo/Other    Renal/GU      Musculoskeletal  (+) Arthritis ,   Abdominal   Peds  Hematology   Anesthesia Other Findings Hx of tachycardia.  Reproductive/Obstetrics                             Anesthesia Physical Anesthesia Plan  ASA: II  Anesthesia Plan: General   Post-op Pain Management:    Induction: Intravenous  PONV Risk Score and Plan:   Airway Management Planned: Oral ETT  Additional Equipment:   Intra-op Plan:   Post-operative Plan:   Informed Consent: I have reviewed the patients History and Physical, chart, labs and discussed the procedure including the risks, benefits and alternatives for the proposed anesthesia with the patient or authorized representative who has indicated his/her understanding and acceptance.       Plan Discussed with: CRNA  Anesthesia Plan Comments:         Anesthesia Quick Evaluation

## 2019-05-25 NOTE — Anesthesia Procedure Notes (Signed)
Procedure Name: Intubation Date/Time: 05/25/2019 7:54 AM Performed by: Gayland Curry, CRNA Pre-anesthesia Checklist: Patient identified, Emergency Drugs available, Suction available and Patient being monitored Patient Re-evaluated:Patient Re-evaluated prior to induction Oxygen Delivery Method: Circle system utilized Preoxygenation: Pre-oxygenation with 100% oxygen Induction Type: IV induction Laryngoscope Size: Mac and 3 Grade View: Grade I Tube type: Oral Tube size: 7.0 mm Number of attempts: 1 Placement Confirmation: ETT inserted through vocal cords under direct vision,  positive ETCO2 and breath sounds checked- equal and bilateral Secured at: 21 cm Tube secured with: Tape Dental Injury: Teeth and Oropharynx as per pre-operative assessment

## 2019-05-25 NOTE — Discharge Instructions (Signed)
You have a ureteral stent in place.  This is a tube that extends from your kidney to your bladder.  This may cause urinary bleeding, burning with urination, and urinary frequency.  Please call our office or present to the ED if you develop fevers >101 or pain which is not able to be controlled with oral pain medications.  You may be given either Flomax and/ or ditropan to help with bladder spasms and stent pain in addition to pain medications.    Your stent is attached to a string which is taped your left inner thigh.  On Monday morning, you may untape this and pulled gently until the entire stent is removed.  If you have any issues, please call our office.  Please take care when using the bathroom, showering, etc. not to prematurely dislodge her stent.  If the stent does get partially pulled out, go ahead and pulled out the entire way and call our office during office hours to let us know.  [facility] [address] [place], [place] [address] [phone]    AMBULATORY SURGERY  DISCHARGE INSTRUCTIONS   1) The drugs that you were given will stay in your system until tomorrow so for the next 24 hours you should not:  A) Drive an automobile B) Make any legal decisions C) Drink any alcoholic beverage   2) You may resume regular meals tomorrow.  Today it is better to start with liquids and gradually work up to solid foods.  You may eat anything you prefer, but it is better to start with liquids, then soup and crackers, and gradually work up to solid foods.   3) Please notify your doctor immediately if you have any unusual bleeding, trouble breathing, redness and pain at the surgery site, drainage, fever, or pain not relieved by medication.    4) Additional Instructions:        Please contact your physician with any problems or Same Day Surgery at [phone], Monday through Friday 6 am to 4 pm, or [place] [facility] number at  [phone].

## 2019-05-25 NOTE — Op Note (Signed)
Date of procedure: 05/25/19  Preoperative diagnosis:  1. Left kidney stone 2. History of sepsis of urinary source  Postoperative diagnosis:  1. Same as above  Procedure: 1. Left ureteroscopy 2. Left retrograde pyelogram 3. Left ureteral stent exchange 4. Basket extraction of left kidney stone  Surgeon: Vanna ScotlandAshley Elissia Spiewak, MD  Anesthesia: General  Complications: None  Intraoperative findings: 3 mm stone floating within the renal pelvis, able to be extracted via basket without difficulty.  EBL: Minimal  Specimens: Stone fragment  Drains: 6 x 24 French double-J ureteral stent on left, tether left in place  Indication: Pam Khan is a 48 y.o. patient with an obstructing 3 mm proximal ureteral stone who initially presented with sepsis of urinary source and underwent ureteral stent placement emergently.  She returns today for definitive management of her stone.  After reviewing the management options for treatment, she elected to proceed with the above surgical procedure(s). We have discussed the potential benefits and risks of the procedure, side effects of the proposed treatment, the likelihood of the patient achieving the goals of the procedure, and any potential problems that might occur during the procedure or recuperation. Informed consent has been obtained.  Description of procedure:  The patient was taken to the operating room and general anesthesia was induced.  The patient was placed in the dorsal lithotomy position, prepped and draped in the usual sterile fashion, and preoperative antibiotics were administered. A preoperative time-out was performed.   A 21 French scope was advanced per urethra into the bladder.  Attention was turned to left ureteral orifice which a ureteral stent was seen emanating.  The distal coil of the stent was grasped and brought to level the urethral meatus.  This is then cannulated using a sensor wire up to level the kidney and snapped in place as a  safety wire.  A 4.5 semirigid ureteroscope was advanced all the way up to level the proximal ureter where the stone had previously been lodged and the stone was not seen.  There are no other stone fragments appreciated.  A Super Stiff wire was then advanced to the kidney under direct visualization.  A ureteral access sheath, Adriana SimasCook 12/14 JamaicaFrench advanced easily into the proximal ureter over the Super Stiff wire.  A dual-lumen flexible digital ureteroscope was then advanced into the renal pelvis where the stone was encountered.  It was quite small, about 3 mm and appeared to be amenable to basket extraction.  A 1.9 tipless nitinol basket was then used to grasp the stone and pulled out through the sheath without difficulty.  The remainder of the calyces were then directly visualized and no additional stone fragments were identified.  A final retrograde pyelogram was performed creating a roadmap of the kidney.  There was no contrast extravasation and each every calyx was directly visualized.  The scope was then backed down the length of the ureter inspecting along the way and removing this sheath.  There were no ureteral injuries appreciated and no additional stone fragments identified.  A 6 x 24 French double-J ureteral stent was then advanced over the safety wire up to level the kidney.  Was partially drawn till coil hooked over the upper pole calyx and a full coil was noted within the bladder.  The stent string was left affixed to the patient's left inner thigh using Mastisol and Tegaderm.  She was then clean and dry, repositioned in the supine position, resume seizure, and taken the PACU in stable condition.  Plan:  There were no complications in this case.  She will remove her own stent on Monday.  She will follow-up in 4 weeks with renal ultrasound prior.  All questions answered.  Hollice Espy, M.D.

## 2019-05-25 NOTE — Anesthesia Post-op Follow-up Note (Signed)
Anesthesia QCDR form completed.        

## 2019-05-25 NOTE — Transfer of Care (Signed)
Immediate Anesthesia Transfer of Care Note  Patient: Pam Khan  Procedure(s) Performed: CYSTOSCOPY/URETEROSCOPY/STENT Exchange (Left Ureter)  Patient Location: PACU  Anesthesia Type:General  Level of Consciousness: drowsy  Airway & Oxygen Therapy: Patient Spontanous Breathing and Patient connected to nasal cannula oxygen  Post-op Assessment: Report given to RN and Post -op Vital signs reviewed and stable  Post vital signs: Reviewed and stable  Last Vitals:  Vitals Value Taken Time  BP 104/57 05/25/19 0827  Temp 37.2 C 05/25/19 0827  Pulse 81 05/25/19 0831  Resp 13 05/25/19 0831  SpO2 97 % 05/25/19 0831  Vitals shown include unvalidated device data.  Last Pain:  Vitals:   05/25/19 0827  TempSrc:   PainSc: Asleep         Complications: No apparent anesthesia complications

## 2019-05-26 ENCOUNTER — Encounter: Payer: Self-pay | Admitting: Urology

## 2019-05-28 ENCOUNTER — Telehealth: Payer: Self-pay | Admitting: Family Medicine

## 2019-05-28 NOTE — Telephone Encounter (Signed)
Patient called after hours line about incontinence. I spoke to patient today and she had pulled her stent some but all the way out. When she fully removed the stent her symptoms resolved. Patient was informed of the Follow up appointment and I informed her of the RUS that should be completed before her appointment with dr. Erlene Quan.

## 2019-05-29 ENCOUNTER — Ambulatory Visit: Payer: Self-pay | Admitting: Urology

## 2019-06-08 ENCOUNTER — Ambulatory Visit
Admission: RE | Admit: 2019-06-08 | Discharge: 2019-06-08 | Disposition: A | Discharge status: Home / Self Care | Payer: Commercial Managed Care - PPO | Source: Ambulatory Visit | Attending: Urology | Admitting: Urology

## 2019-06-08 ENCOUNTER — Other Ambulatory Visit: Payer: Self-pay

## 2019-06-08 DIAGNOSIS — N201 Calculus of ureter: Secondary | ICD-10-CM | POA: Diagnosis present

## 2019-06-11 ENCOUNTER — Telehealth: Payer: Self-pay

## 2019-06-11 LAB — CALCULI, WITH PHOTOGRAPH (CLINICAL LAB)
Calcium Oxalate Dihydrate: 50 %
Calcium Oxalate Monohydrate: 50 %
Weight Calculi: 11 mg

## 2019-06-11 NOTE — Telephone Encounter (Signed)
Called pt, no answer. Unable to leave message as no voicemail is set up.  

## 2019-06-11 NOTE — Telephone Encounter (Signed)
-----   Message from Hollice Espy, MD sent at 06/11/2019  7:36 AM EDT ----- RUS looks great.  We will discuss further at your follow up.  Hollice Espy, MD

## 2019-06-12 ENCOUNTER — Other Ambulatory Visit: Payer: Self-pay | Admitting: Family Medicine

## 2019-06-14 NOTE — Telephone Encounter (Signed)
Called pt informed her of the information below. Pt gave verbal understanding.  

## 2019-06-27 ENCOUNTER — Ambulatory Visit (INDEPENDENT_AMBULATORY_CARE_PROVIDER_SITE_OTHER): Payer: Commercial Managed Care - PPO | Admitting: Urology

## 2019-06-27 ENCOUNTER — Encounter: Payer: Self-pay | Admitting: Urology

## 2019-06-27 ENCOUNTER — Other Ambulatory Visit: Payer: Self-pay

## 2019-06-27 VITALS — BP 149/87 | HR 89 | Ht 66.0 in | Wt 161.6 lb

## 2019-06-27 DIAGNOSIS — N201 Calculus of ureter: Secondary | ICD-10-CM | POA: Diagnosis not present

## 2019-06-27 NOTE — Progress Notes (Signed)
06/27/2019 1:47 PM   [name] 10-Aug-1971 [id number]  Referring provider: Lavera Guise, [place],  [place] [address]  Chief Complaint  Patient presents with  . Routine Post Op    HPI: 48 year old female who initially presented with concern for urosepsis secondary to a 3 mm obstructing left proximal ureteral stone who returns today for routine follow-up.  She was taken emergently to the operating room for ureteral stent.  She was treated for UTI and then ultimately returned to the operating room for staged ureteroscopy on 05/25/2019 for definitive management of her stone.  She removed her own stent.  Follow-up renal ultrasound on 06/08/2019 shows no hydroureteronephrosis or any other residual stone burden.  She reports that she is done well since surgery.  She has no urinary complaints today including no dysuria, gross hematuria, flank pain, or any other symptoms.  Stone composition 50% calcium oxalate monohydrate, 50% calcium oxalate dihydrate  She has no previous stone episodes.  She does try to drink a good amount of water.  She does not have a high oxalate rich diet.   PMH: Past Medical History:  Diagnosis Date  . Anxiety   . Asthma   . Barrett esophagus   . DDD (degenerative disc disease), lumbar   . Family history of adverse reaction to anesthesia    FATHER ALWAYS TROUBLE WAKING UP. WITH NECK SURGERY 2008/ASPIRATED EVENTUALLY DIED PNEUMONIA ETC  . GERD (gastroesophageal reflux disease)   . Hypertension   . Spinal stenosis of lumbar region     Surgical History: Past Surgical History:  Procedure Laterality Date  . BACK SURGERY  1985  . CESAREAN SECTION  1989  . COSMETIC SURGERY  1985   head  . CYSTOSCOPY WITH STENT PLACEMENT Left 05/17/2019   Procedure: CYSTOSCOPY WITH STENT PLACEMENT;  Surgeon: Hollice Espy, MD;  Location: [facility];  Service: Urology;  Laterality: Left;  . CYSTOSCOPY/URETEROSCOPY/HOLMIUM LASER/STENT PLACEMENT Left 05/25/2019    Procedure: CYSTOSCOPY/URETEROSCOPY/STENT Exchange;  Surgeon: Hollice Espy, MD;  Location: [facility];  Service: Urology;  Laterality: Left;  . DILATION AND CURETTAGE OF UTERUS  2007    Home Medications:  Allergies as of 06/27/2019   No Known Allergies     Medication List       Accurate as of June 27, 2019  1:47 PM. If you have any questions, ask your nurse or doctor.        STOP taking these medications   sulfamethoxazole-trimethoprim 800-160 MG tablet Commonly known as: BACTRIM DS Stopped by: Hollice Espy, MD     TAKE these medications   ALPRAZolam 0.25 MG tablet Commonly known as: XANAX Take 1 tablet (0.25 mg total) by mouth 2 (two) times daily as needed for anxiety.   diltiazem 120 MG 24 hr capsule Commonly known as: CARDIZEM CD TAKE 1 CAPSULE BY MOUTH AT BEDTIME FOR HEART RATE   norethindrone 0.35 MG tablet Commonly known as: MICRONOR TAKE 1 TABLET BY MOUTH EVERY DAY   omeprazole 40 MG capsule Commonly known as: PRILOSEC TAKE ONE CAPSULE BY MOUTH EVERY DAY FOR HEART BURN   Shingrix injection Generic drug: Zoster Vaccine Adjuvanted Shingrix (PF) 50 mcg/0.5 mL intramuscular suspension, kit   TDVAX 2-2 LF/0.5ML injection Generic drug: diptheria-tetanus toxoids TDVAX 2 Lf unit-2 Lf unit/0.5 mL intramuscular suspension   traMADol 50 MG tablet Commonly known as: Ultram Take 1 tablet (50 mg total) by mouth every 6 (six) hours as needed.   triamterene-hydrochlorothiazide 37.5-25 MG tablet Commonly known as:  MAXZIDE-25 TAKE 1 TABLET BY MOUTH EVERY DAY       Allergies: No Known Allergies  Family History: Family History  Problem Relation Age of Onset  . Breast cancer Mother 55  . Breast cancer Maternal Grandmother 60  . Diabetes Father   . Heart attack Father     Social History:  reports that she has never smoked. She has never used smokeless tobacco. She reports current alcohol use. She reports that she does not use drugs.  ROS: UROLOGY  Frequent Urination?: No Hard to postpone urination?: No Burning/pain with urination?: No Get up at night to urinate?: No Leakage of urine?: No Urine stream starts and stops?: No Trouble starting stream?: No Do you have to strain to urinate?: No Blood in urine?: No Urinary tract infection?: No Sexually transmitted disease?: No Injury to kidneys or bladder?: No Painful intercourse?: No Weak stream?: No Currently pregnant?: No Vaginal bleeding?: No Last menstrual period?: N/A  Gastrointestinal Nausea?: No Vomiting?: No Indigestion/heartburn?: Yes Diarrhea?: No Constipation?: No  Constitutional Fever: No Night sweats?: No Weight loss?: No Fatigue?: No  Skin Skin rash/lesions?: No Itching?: No  Eyes Blurred vision?: No Double vision?: No  Ears/Nose/Throat Sore throat?: No Sinus problems?: No  Hematologic/Lymphatic Swollen glands?: No Easy bruising?: No  Cardiovascular Leg swelling?: No Chest pain?: No  Respiratory Cough?: No Shortness of breath?: No  Endocrine Excessive thirst?: No  Musculoskeletal Back pain?: No Joint pain?: No  Neurological Headaches?: No Dizziness?: No  Psychologic Depression?: No Anxiety?: No  Physical Exam: BP (!) 149/87 (BP Location: Left Arm, Patient Position: Sitting, Cuff Size: Normal)   Pulse 89   Ht '5\' 6"'$  (1.676 m)   Wt 161 lb 9.6 oz (73.3 kg)   BMI 26.08 kg/m   Constitutional:  Alert and oriented, No acute distress. HEENT: [place] AT, moist mucus membranes.  Trachea midline, no masses. Cardiovascular: No clubbing, cyanosis, or edema. Respiratory: Normal respiratory effort, no increased work of breathing. Skin: No rashes, bruises or suspicious lesions. Neurologic: Grossly intact, no focal deficits, moving all 4 extremities. Psychiatric: Normal mood and affect.  Laboratory Data: Lab Results  Component Value Date   WBC 12.4 (H) 05/22/2019   HGB 14.8 05/22/2019   HCT 42.4 05/22/2019   MCV 89.1 05/22/2019   PLT  360 05/22/2019    Lab Results  Component Value Date   CREATININE 1.14 (H) 05/22/2019   Lab Results  Component Value Date   HGBA1C 5.1 05/17/2019    Pertinent Imaging: Results for orders placed during the hospital encounter of 06/08/19  US RENAL   Narrative CLINICAL DATA:  History of renal stones.  EXAM: RENAL / URINARY TRACT ULTRASOUND COMPLETE  COMPARISON:  CT abdomen pelvis 05/17/2019  FINDINGS: Right Kidney:  Renal measurements: 10.2 x 4.9 x 4.7 cm = volume: 124.7 mL . Echogenicity within normal limits. No mass or hydronephrosis visualized.  Left Kidney:  Renal measurements: 11.4 x 4.8 x 4.7 cm = volume: 135.1 mL. Echogenicity within normal limits. No mass or hydronephrosis visualized.  Bladder:  Appears normal for degree of bladder distention.  IMPRESSION: No hydronephrosis.   Electronically Signed   By: Lovey Newcomer M.D.   On: 06/08/2019 16:03    Renal ultrasound images personally reviewed today  Assessment & Plan:    1. Left ureteral stone History of obstructing left ureteral stone status post definitive management of the stone Renal ultrasound without residual hydronephrosis or stone burden Stone composition reviewed We discussed general stone prevention techniques including drinking plenty  water with goal of producing 2.5 L urine daily, increased citric acid intake, avoidance of high oxalate containing foods, and decreased salt intake.  Information about dietary recommendations given today.  At this point in time, we will see her as needed  Hollice Espy, MD  [facility] [address] [place],  [address] [phone]

## 2019-07-03 ENCOUNTER — Ambulatory Visit (INDEPENDENT_AMBULATORY_CARE_PROVIDER_SITE_OTHER): Payer: Commercial Managed Care - PPO | Admitting: Nurse Practitioner

## 2019-07-03 ENCOUNTER — Encounter: Payer: Self-pay | Admitting: Nurse Practitioner

## 2019-07-03 ENCOUNTER — Other Ambulatory Visit: Payer: Self-pay

## 2019-07-03 VITALS — BP 143/87 | HR 82 | Resp 16 | Ht 66.0 in | Wt 160.0 lb

## 2019-07-03 DIAGNOSIS — Z0001 Encounter for general adult medical examination with abnormal findings: Secondary | ICD-10-CM | POA: Diagnosis not present

## 2019-07-03 DIAGNOSIS — K219 Gastro-esophageal reflux disease without esophagitis: Secondary | ICD-10-CM | POA: Diagnosis not present

## 2019-07-03 DIAGNOSIS — M48061 Spinal stenosis, lumbar region without neurogenic claudication: Secondary | ICD-10-CM | POA: Insufficient documentation

## 2019-07-03 DIAGNOSIS — B001 Herpesviral vesicular dermatitis: Secondary | ICD-10-CM

## 2019-07-03 DIAGNOSIS — B373 Candidiasis of vulva and vagina: Secondary | ICD-10-CM | POA: Diagnosis not present

## 2019-07-03 DIAGNOSIS — F411 Generalized anxiety disorder: Secondary | ICD-10-CM | POA: Insufficient documentation

## 2019-07-03 DIAGNOSIS — R300 Dysuria: Secondary | ICD-10-CM | POA: Insufficient documentation

## 2019-07-03 DIAGNOSIS — B3731 Acute candidiasis of vulva and vagina: Secondary | ICD-10-CM | POA: Insufficient documentation

## 2019-07-03 DIAGNOSIS — I10 Essential (primary) hypertension: Secondary | ICD-10-CM | POA: Diagnosis not present

## 2019-07-03 DIAGNOSIS — Z30011 Encounter for initial prescription of contraceptive pills: Secondary | ICD-10-CM | POA: Insufficient documentation

## 2019-07-03 MED ORDER — ALPRAZOLAM 0.25 MG PO TABS: 0.2500 mg | ORAL_TABLET | Freq: Two times a day (BID) | ORAL | 1 refills | Status: DC | PRN

## 2019-07-03 MED ORDER — DILTIAZEM HCL ER COATED BEADS 120 MG PO CP24: ORAL_CAPSULE | ORAL | 1 refills | Status: DC

## 2019-07-03 MED ORDER — FAMCICLOVIR 500 MG PO TABS: 500.0000 mg | ORAL_TABLET | Freq: Two times a day (BID) | ORAL | 3 refills | Status: DC

## 2019-07-03 MED ORDER — NORETHINDRONE 0.35 MG PO TABS: 1.0000 | ORAL_TABLET | Freq: Every day | ORAL | 3 refills | Status: DC

## 2019-07-03 MED ORDER — TRIAMTERENE-HCTZ 37.5-25 MG PO TABS: 1.0000 | ORAL_TABLET | Freq: Every day | ORAL | 3 refills | Status: DC

## 2019-07-03 MED ORDER — FLUCONAZOLE 150 MG PO TABS: ORAL_TABLET | ORAL | 1 refills | Status: DC

## 2019-07-03 MED ORDER — TRAMADOL HCL 50 MG PO TABS: 50.0000 mg | ORAL_TABLET | Freq: Four times a day (QID) | ORAL | 0 refills | Status: DC | PRN

## 2019-07-03 MED ORDER — OMEPRAZOLE 40 MG PO CPDR: DELAYED_RELEASE_CAPSULE | ORAL | 3 refills | Status: DC

## 2019-07-03 NOTE — Progress Notes (Signed)
[facility] [place], [place] [address]  Internal MEDICINE  Office Visit Note  Patient Name: Pam Khan  [id number]  [id number]  Date of Service: 07/03/2019   Pt is here for routine health maintenance examination   Chief Complaint  Patient presents with  . Annual Exam  . Gastroesophageal Reflux  . Hypertension  . Quality Metric Gaps    colonoscopy     The patient is here for health maintenance exam. She had quite eventful July. Developed spinal stenosis. Was going to have epidural injection, however, her blood pressure was very elevated, and she was unable to get the injection. She restarted on her blood pressure medication. Before she could be rescheduled for the injection, she developed a kidney stone with pyelonephritis and urosepsis. She was hospitalized. Had to have surgery to drain abscess, had two separate stents placed to drain fluid and stone. She had follow up last week with urology. Ultrasound indicated that she is doing well. No further evidence of stone or infection. She states that while in the hospital, she did have to have two insulin shots. She is concerned about developing diabetes. She had HgbA1c checked in the hospital 05/17/2019 and it was 5.1.    Current Medication: Outpatient Encounter Medications as of 07/03/2019  Medication Sig  . ALPRAZolam (XANAX) 0.25 MG tablet Take 1 tablet (0.25 mg total) by mouth 2 (two) times daily as needed for anxiety.  Marland Kitchen diltiazem (CARDIZEM CD) 120 MG 24 hr capsule TAKE 1 CAPSULE BY MOUTH AT BEDTIME FOR HEART RATE  . diptheria-tetanus toxoids (TDVAX) 2-2 LF/0.5ML injection TDVAX 2 Lf unit-2 Lf unit/0.5 mL intramuscular suspension  . norethindrone (MICRONOR) 0.35 MG tablet Take 1 tablet (0.35 mg total) by mouth daily.  Marland Kitchen omeprazole (PRILOSEC) 40 MG capsule TAKE ONE CAPSULE BY MOUTH EVERY DAY FOR HEART BURN  . traMADol (ULTRAM) 50 MG tablet Take 1 tablet (50 mg total) by mouth every 6 (six) hours as needed.   . triamterene-hydrochlorothiazide (MAXZIDE-25) 37.5-25 MG tablet Take 1 tablet by mouth daily.  Marland Kitchen Zoster Vaccine Adjuvanted [facility]) injection Shingrix (PF) 50 mcg/0.5 mL intramuscular suspension, kit  . [DISCONTINUED] ALPRAZolam (XANAX) 0.25 MG tablet Take 1 tablet (0.25 mg total) by mouth 2 (two) times daily as needed for anxiety.  . [DISCONTINUED] diltiazem (CARDIZEM CD) 120 MG 24 hr capsule TAKE 1 CAPSULE BY MOUTH AT BEDTIME FOR HEART RATE  . [DISCONTINUED] norethindrone (MICRONOR,CAMILA,ERRIN) 0.35 MG tablet TAKE 1 TABLET BY MOUTH EVERY DAY  . [DISCONTINUED] omeprazole (PRILOSEC) 40 MG capsule TAKE ONE CAPSULE BY MOUTH EVERY DAY FOR HEART BURN  . [DISCONTINUED] traMADol (ULTRAM) 50 MG tablet Take 1 tablet (50 mg total) by mouth every 6 (six) hours as needed.  . [DISCONTINUED] triamterene-hydrochlorothiazide (MAXZIDE-25) 37.5-25 MG tablet TAKE 1 TABLET BY MOUTH EVERY DAY  . famciclovir (FAMVIR) 500 MG tablet Take 1 tablet (500 mg total) by mouth 2 (two) times daily.  . fluconazole (DIFLUCAN) 150 MG tablet Take 1 tablet po once. May repeat dose in 3 days as needed for persistent symptoms.   No facility-administered encounter medications on file as of 07/03/2019.     Surgical History: Past Surgical History:  Procedure Laterality Date  . BACK SURGERY  1985  . CESAREAN SECTION  1989  . COSMETIC SURGERY  1985   head  . CYSTOSCOPY WITH STENT PLACEMENT Left 05/17/2019   Procedure: CYSTOSCOPY WITH STENT PLACEMENT;  Surgeon: Hollice Espy, MD;  Location: [facility] ORS;  Service: Urology;  Laterality: Left;  .  CYSTOSCOPY/URETEROSCOPY/HOLMIUM LASER/STENT PLACEMENT Left 05/25/2019   Procedure: CYSTOSCOPY/URETEROSCOPY/STENT Exchange;  Surgeon: Hollice Espy, MD;  Location: [facility] ORS;  Service: Urology;  Laterality: Left;  . DILATION AND CURETTAGE OF UTERUS  2007    Medical History: Past Medical History:  Diagnosis Date  . Anxiety   . Asthma   . Barrett esophagus   . DDD (degenerative disc  disease), lumbar   . Family history of adverse reaction to anesthesia    FATHER ALWAYS TROUBLE WAKING UP. WITH NECK SURGERY 2008/ASPIRATED EVENTUALLY DIED PNEUMONIA ETC  . GERD (gastroesophageal reflux disease)   . Hypertension   . Spinal stenosis of lumbar region     Family History: Family History  Problem Relation Age of Onset  . Breast cancer Mother 44  . Breast cancer Maternal Grandmother 60  . Diabetes Father   . Heart attack Father       Review of Systems  Constitutional: Negative for activity change, chills, fatigue and unexpected weight change.  HENT: Negative for congestion, postnasal drip, rhinorrhea, sneezing and sore throat.   Respiratory: Negative for cough, chest tightness, shortness of breath and wheezing.   Cardiovascular: Negative for chest pain and palpitations.  Gastrointestinal: Negative for abdominal pain, constipation, diarrhea, nausea and vomiting.  Endocrine: Negative for cold intolerance, heat intolerance, polydipsia and polyuria.       Blood sugars elevated while hospitalized  Genitourinary: Negative for dysuria, frequency, hematuria and vaginal bleeding.  Musculoskeletal: Positive for back pain. Negative for arthralgias, joint swelling and neck pain.       Patient recently diagnosed with spinal stenosis through orthopedics.   Skin: Negative for rash.  Allergic/Immunologic: Negative for environmental allergies.  Neurological: Negative for dizziness, tremors, numbness and headaches.  Hematological: Negative for adenopathy. Does not bruise/bleed easily.  Psychiatric/Behavioral: Negative for behavioral problems (Depression), sleep disturbance and suicidal ideas. The patient is not nervous/anxious.      Today's Vitals   07/03/19 0908  BP: (!) 143/87  Pulse: 82  Resp: 16  SpO2: 99%  Weight: 160 lb (72.6 kg)  Height: _0  (1.676 m)   Body mass index is 25.82 kg/m.  Physical Exam Vitals signs and nursing note reviewed.  Constitutional:       General: She is not in acute distress.    Appearance: Normal appearance. She is well-developed. She is not diaphoretic.  HENT:     Head: Normocephalic and atraumatic.     Mouth/Throat:     Pharynx: No oropharyngeal exudate.  Eyes:     Pupils: Pupils are equal, round, and reactive to light.  Neck:     Musculoskeletal: Normal range of motion and neck supple.     Thyroid: No thyromegaly.     Vascular: No JVD.     Trachea: No tracheal deviation.  Cardiovascular:     Rate and Rhythm: Normal rate and regular rhythm.     Pulses: Normal pulses.     Heart sounds: Normal heart sounds. No murmur. No friction rub. No gallop.   Pulmonary:     Effort: Pulmonary effort is normal. No respiratory distress.     Breath sounds: Normal breath sounds. No wheezing or rales.  Chest:     Chest wall: No tenderness.     Breasts:        Right: No swelling, bleeding, inverted nipple, mass, nipple discharge, skin change or tenderness.        Left: Normal. No swelling, bleeding, inverted nipple, mass, nipple discharge, skin change or tenderness.  Abdominal:  General: Bowel sounds are normal.     Palpations: Abdomen is soft.     Tenderness: There is no abdominal tenderness.  Musculoskeletal: Normal range of motion.  Lymphadenopathy:     Cervical: No cervical adenopathy.  Skin:    General: Skin is warm and dry.  Neurological:     Mental Status: She is alert and oriented to person, place, and time.     Cranial Nerves: No cranial nerve deficit.  Psychiatric:        Behavior: Behavior normal.        Thought Content: Thought content normal.        Judgment: Judgment normal.      LABS: Recent Results (from the past 2160 hour(s))  CBC with Differential     Status: Abnormal   Collection Time: 05/17/19  8:07 AM  Result Value Ref Range   WBC 2.9 (L) 4.0 - 10.5 K/uL   RBC 4.78 3.87 - 5.11 MIL/uL   Hemoglobin 15.1 (H) 12.0 - 15.0 g/dL   HCT 44.0 36.0 - 46.0 %   MCV 92.1 80.0 - 100.0 fL   MCH 31.6 26.0  - 34.0 pg   MCHC 34.3 30.0 - 36.0 g/dL   RDW 12.3 11.5 - 15.5 %   Platelets 242 150 - 400 K/uL   nRBC 0.0 0.0 - 0.2 %   Neutrophils Relative % 94 %   Neutro Abs 2.7 1.7 - 7.7 K/uL   Lymphocytes Relative 5 %   Lymphs Abs 0.2 (L) 0.7 - 4.0 K/uL   Monocytes Relative 1 %   Monocytes Absolute 0.0 (L) 0.1 - 1.0 K/uL   Eosinophils Relative 0 %   Eosinophils Absolute 0.0 0.0 - 0.5 K/uL   Basophils Relative 0 %   Basophils Absolute 0.0 0.0 - 0.1 K/uL   Immature Granulocytes 0 %   Abs Immature Granulocytes 0.00 0.00 - 0.07 K/uL    Comment: Performed at [facility], [address] [place], [place] [address]  Comprehensive metabolic panel     Status: Abnormal   Collection Time: 05/17/19  8:07 AM  Result Value Ref Range   Sodium 134 (L) 135 - 145 mmol/L   Potassium 3.8 3.5 - 5.1 mmol/L   Chloride 100 98 - 111 mmol/L   CO2 16 (L) 22 - 32 mmol/L   Glucose, Bld 228 (H) 70 - 99 mg/dL   BUN 25 (H) 6 - 20 mg/dL   Creatinine, Ser 1.43 (H) 0.44 - 1.00 mg/dL   Calcium 9.2 8.9 - 10.3 mg/dL   Total Protein 7.4 6.5 - 8.1 g/dL   Albumin 4.4 3.5 - 5.0 g/dL   AST 40 15 - 41 U/L   ALT 47 (H) 0 - 44 U/L    Comment: RESULT CONFIRMED BY MANUAL DILUTION SNG   Alkaline Phosphatase 100 38 - 126 U/L   Total Bilirubin 1.9 (H) 0.3 - 1.2 mg/dL   GFR calc non Af Amer 43 (L) >60 mL/min   GFR calc Af Amer 49 (L) >60 mL/min   Anion gap 18 (H) 5 - 15    Comment: Performed at [facility], [address] [place], [place] [address]  Lipase, blood     Status: None   Collection Time: 05/17/19  8:07 AM  Result Value Ref Range   Lipase 35 11 - 51 U/L    Comment: Performed at [facility], [address] [place], [place] [address]  Urinalysis, Complete w Microscopic     Status:  Abnormal   Collection Time: 05/17/19  8:07 AM  Result Value Ref Range   Color, Urine YELLOW (A) YELLOW   APPearance CLOUDY (A) CLEAR   Specific Gravity, Urine 1.023 1.005 - 1.030   pH 6.0 5.0 - 8.0    Glucose, UA NEGATIVE NEGATIVE mg/dL   Hgb urine dipstick MODERATE (A) NEGATIVE   Bilirubin Urine NEGATIVE NEGATIVE   Ketones, ur NEGATIVE NEGATIVE mg/dL   Protein, ur 30 (A) NEGATIVE mg/dL   Nitrite POSITIVE (A) NEGATIVE   Leukocytes,Ua MODERATE (A) NEGATIVE   RBC / HPF 11-20 0 - 5 RBC/hpf   WBC, UA >50 (H) 0 - 5 WBC/hpf   Bacteria, UA FEW (A) NONE SEEN   Squamous Epithelial / LPF 0-5 0 - 5   WBC Clumps PRESENT    Mucus PRESENT    Hyaline Casts, UA PRESENT    Non Squamous Epithelial PRESENT (A) NONE SEEN    Comment: Performed at [facility], [address] [place], [place] [address]  Urine Culture     Status: Abnormal   Collection Time: 05/17/19  8:07 AM   Specimen: Urine, Clean Catch  Result Value Ref Range   Specimen Description      URINE, CLEAN CATCH Performed at [facility], [address] [place], [place] [address]    Special Requests      Normal Performed at [facility], [address] [place], [place] [address]    Culture >=100,000 COLONIES/mL ESCHERICHIA COLI (A)    Report Status 05/19/2019 FINAL    Organism ID, Bacteria ESCHERICHIA COLI (A)       Susceptibility   Escherichia coli - MIC*    AMPICILLIN 4 SENSITIVE Sensitive     CEFAZOLIN <=4 SENSITIVE Sensitive     CEFTRIAXONE <=1 SENSITIVE Sensitive     CIPROFLOXACIN <=0.25 SENSITIVE Sensitive     GENTAMICIN <=1 SENSITIVE Sensitive     IMIPENEM <=0.25 SENSITIVE Sensitive     NITROFURANTOIN <=16 SENSITIVE Sensitive     TRIMETH/SULFA <=20 SENSITIVE Sensitive     AMPICILLIN/SULBACTAM <=2 SENSITIVE Sensitive     PIP/TAZO <=4 SENSITIVE Sensitive     Extended ESBL NEGATIVE Sensitive     * >=100,000 COLONIES/mL ESCHERICHIA COLI  Hemoglobin A1c     Status: None   Collection Time: 05/17/19  8:07 AM  Result Value Ref Range   Hgb A1c MFr Bld 5.1 4.8 - 5.6 %    Comment: (NOTE)         Prediabetes: 5.7 - 6.4         Diabetes: >6.4         Glycemic control for adults with diabetes:  <7.0    Mean Plasma Glucose 100 mg/dL    Comment: (NOTE) Performed At: [facility] [place], [place] [id number] Rush Farmer MD [id number]   SARS Coronavirus 2 (CEPHEID - Performed in [place] hospital lab), Hosp Order     Status: None   Collection Time: 05/17/19 10:00 AM   Specimen: Nasopharyngeal Swab  Result Value Ref Range   SARS Coronavirus 2 NEGATIVE NEGATIVE    Comment: (NOTE) If result is NEGATIVE SARS-CoV-2 target nucleic acids are NOT DETECTED. The SARS-CoV-2 RNA is generally detectable in upper and lower  respiratory specimens during the acute phase of infection. The lowest  concentration of SARS-CoV-2 viral copies this assay can detect is 250  copies / mL. A negative result does not preclude SARS-CoV-2 infection  and should not be used as the  sole basis for treatment or other  patient management decisions.  A negative result may occur with  improper specimen collection / handling, submission of specimen other  than nasopharyngeal swab, presence of viral mutation(s) within the  areas targeted by this assay, and inadequate number of viral copies  (<250 copies / mL). A negative result must be combined with clinical  observations, patient history, and epidemiological information. If result is POSITIVE SARS-CoV-2 target nucleic acids are DETECTED. The SARS-CoV-2 RNA is generally detectable in upper and lower  respiratory specimens dur ing the acute phase of infection.  Positive  results are indicative of active infection with SARS-CoV-2.  Clinical  correlation with patient history and other diagnostic information is  necessary to determine patient infection status.  Positive results do  not rule out bacterial infection or co-infection with other viruses. If result is PRESUMPTIVE POSTIVE SARS-CoV-2 nucleic acids MAY BE PRESENT.   A presumptive positive result was obtained on the submitted specimen  and confirmed on repeat testing.  While  2019 novel coronavirus  (SARS-CoV-2) nucleic acids may be present in the submitted sample  additional confirmatory testing may be necessary for epidemiological  and / or clinical management purposes  to differentiate between  SARS-CoV-2 and other Sarbecovirus currently known to infect humans.  If clinically indicated additional testing with an alternate test  methodology [phone]) is advised. The SARS-CoV-2 RNA is generally  detectable in upper and lower respiratory sp ecimens during the acute  phase of infection. The expected result is Negative. Fact Sheet for Patients:  StrictlyIdeas.no Fact Sheet for Healthcare Providers: BankingDealers.co.za This test is not yet approved or cleared by the [place] FDA and has been authorized for detection and/or diagnosis of SARS-CoV-2 by FDA under an Emergency Use Authorization (EUA).  This EUA will remain in effect (meaning this test can be used) for the duration of the COVID-19 declaration under Section 564(b)(1) of the Act, 21 U.S.C. section 360bbb-3(b)(1), unless the authorization is terminated or revoked sooner. Performed at [facility], [address] [place], [place] [address]   Glucose, capillary     Status: Abnormal   Collection Time: 05/17/19  4:28 PM  Result Value Ref Range   Glucose-Capillary 138 (H) 70 - 99 mg/dL  Glucose, capillary     Status: Abnormal   Collection Time: 05/17/19  9:49 PM  Result Value Ref Range   Glucose-Capillary 218 (H) 70 - 99 mg/dL  HIV Antibody (routine testing w rflx)     Status: None   Collection Time: 05/18/19  4:51 AM  Result Value Ref Range   HIV Screen 4th Generation wRfx Non Reactive Non Reactive    Comment: (NOTE) Performed At: [facility] [place], [place] [id number] Rush Farmer MD [id number]   Basic metabolic panel     Status: Abnormal   Collection Time: 05/18/19  4:51 AM  Result Value Ref Range    Sodium 139 135 - 145 mmol/L   Potassium 4.1 3.5 - 5.1 mmol/L   Chloride 110 98 - 111 mmol/L   CO2 22 22 - 32 mmol/L   Glucose, Bld 158 (H) 70 - 99 mg/dL   BUN 20 6 - 20 mg/dL   Creatinine, Ser 0.89 0.44 - 1.00 mg/dL   Calcium 8.1 (L) 8.9 - 10.3 mg/dL   GFR calc non Af Amer >60 >60 mL/min   GFR calc Af Amer >60 >60 mL/min   Anion gap 7 5 - 15    Comment: Performed at  [facility], [address] [place], [place] [address]  CBC     Status: Abnormal   Collection Time: 05/18/19  4:51 AM  Result Value Ref Range   WBC 16.3 (H) 4.0 - 10.5 K/uL   RBC 3.71 (L) 3.87 - 5.11 MIL/uL   Hemoglobin 11.8 (L) 12.0 - 15.0 g/dL   HCT 34.4 (L) 36.0 - 46.0 %   MCV 92.7 80.0 - 100.0 fL   MCH 31.8 26.0 - 34.0 pg   MCHC 34.3 30.0 - 36.0 g/dL   RDW 12.6 11.5 - 15.5 %   Platelets 198 150 - 400 K/uL   nRBC 0.0 0.0 - 0.2 %    Comment: Performed at [facility], [address] [place], [place] [address]  Glucose, capillary     Status: Abnormal   Collection Time: 05/18/19  7:49 AM  Result Value Ref Range   Glucose-Capillary 148 (H) 70 - 99 mg/dL  Glucose, capillary     Status: Abnormal   Collection Time: 05/18/19 11:29 AM  Result Value Ref Range   Glucose-Capillary 254 (H) 70 - 99 mg/dL   Comment 1 Notify RN   SARS Coronavirus 2 (Performed in [place] hospital lab)     Status: None   Collection Time: 05/22/19 10:35 AM   Specimen: Nasal Swab  Result Value Ref Range   SARS Coronavirus 2 NEGATIVE NEGATIVE    Comment: (NOTE) SARS-CoV-2 target nucleic acids are NOT DETECTED. The SARS-CoV-2 RNA is generally detectable in upper and lower respiratory specimens during the acute phase of infection. Negative results do not preclude SARS-CoV-2 infection, do not rule out co-infections with other pathogens, and should not be used as the sole basis for treatment or other patient management decisions. Negative results must be combined with clinical observations, patient history, and  epidemiological information. The expected result is Negative. Fact Sheet for Patients: SugarRoll.be Fact Sheet for Healthcare Providers: https://www.woods-mathews.com/ This test is not yet approved or cleared by the [place] FDA and  has been authorized for detection and/or diagnosis of SARS-CoV-2 by FDA under an Emergency Use Authorization (EUA). This EUA will remain  in effect (meaning this test can be used) for the duration of the COVID-19 declaration under Section 56 4(b)(1) of the Act, 21 U.S.C. section 360bbb-3(b)(1), unless the authorization is terminated or revoked sooner. Performed at [facility], [place] [address], [place], [place] [address]   Basic metabolic panel     Status: Abnormal   Collection Time: 05/22/19 10:35 AM  Result Value Ref Range   Sodium 137 135 - 145 mmol/L   Potassium 3.8 3.5 - 5.1 mmol/L   Chloride 105 98 - 111 mmol/L   CO2 21 (L) 22 - 32 mmol/L   Glucose, Bld 131 (H) 70 - 99 mg/dL   BUN 18 6 - 20 mg/dL   Creatinine, Ser 1.14 (H) 0.44 - 1.00 mg/dL   Calcium 9.3 8.9 - 10.3 mg/dL   GFR calc non Af Amer 56 (L) >60 mL/min   GFR calc Af Amer >60 >60 mL/min   Anion gap 11 5 - 15    Comment: Performed at [facility], [address] [place], [place] [address]  CBC     Status: Abnormal   Collection Time: 05/22/19 10:35 AM  Result Value Ref Range   WBC 12.4 (H) 4.0 - 10.5 K/uL   RBC 4.76 3.87 - 5.11 MIL/uL   Hemoglobin 14.8 12.0 - 15.0 g/dL   HCT 42.4 36.0 -  46.0 %   MCV 89.1 80.0 - 100.0 fL   MCH 31.1 26.0 - 34.0 pg   MCHC 34.9 30.0 - 36.0 g/dL   RDW 12.0 11.5 - 15.5 %   Platelets 360 150 - 400 K/uL   nRBC 0.0 0.0 - 0.2 %    Comment: Performed at [facility], [address] [place], [place] [address]  Pregnancy, urine POC     Status: None   Collection Time: 05/25/19  6:09 AM  Result Value Ref Range   Preg Test, Ur NEGATIVE NEGATIVE    Comment:        THE SENSITIVITY OF  THIS METHODOLOGY IS >24 mIU/mL   Calculi, with Photograph     Status: None   Collection Time: 05/25/19  8:12 AM  Result Value Ref Range   Source Calculi Comment     Comment: Left Ureter CORRECTED ON 08/03 AT 2135: PREVIOUSLY REPORTED AS LEFT URETHRAL STONE    Color Calculi Pearline Cables    Size Calculi 3x2 mm    Comment: Single piece received.   Weight Calculi 11 mg   Composition Calculi Comment     Comment: Percentage (Represents the % composition)   Calcium Oxalate Monohydrate 50 %   Calcium Oxalate Dihydrate 50 %   Photo Calculi Comment     Comment: Photograph will follow under a separate cover   Comment Calculi 3 Comment     Comment: (NOTE) Physician questions regarding Calculi Analysis contact [facility] at: [phone].    Please Note: Comment     Comment: (NOTE) Calculi report will follow via computer, mail or courier delivery.    DISCLAIMER: Comment     Comment: (NOTE) This test was developed and its performance characteristics determined by [facility].  It has not been cleared or approved by the Food and Drug Administration. Performed At: [facility] [place], [place] [id number] Thomasene Ripple MD QI:[id number]    Assessment/Plan: 1. Encounter for general adult medical examination with abnormal findings Annual health maintenance exam today. Lab slip given to have routine, fasting labs drawn.   2. Hypertension, unspecified type Stable. Continue bp medication as prescribed  - diltiazem (CARDIZEM CD) 120 MG 24 hr capsule; TAKE 1 CAPSULE BY MOUTH AT BEDTIME FOR HEART RATE  Dispense: 90 capsule; Refill: 1 - triamterene-hydrochlorothiazide (MAXZIDE-25) 37.5-25 MG tablet; Take 1 tablet by mouth daily.  Dispense: 90 tablet; Refill: 3  3. Vaginal candidiasis Intemitent. Diflucan may be used as needed and as prescribed  - fluconazole (DIFLUCAN) 150 MG tablet; Take 1 tablet po once. May repeat dose in 3 days as needed for persistent symptoms.   Dispense: 3 tablet; Refill: 1  4. Gastroesophageal reflux disease without esophagitis - omeprazole (PRILOSEC) 40 MG capsule; TAKE ONE CAPSULE BY MOUTH EVERY DAY FOR HEART BURN  Dispense: 90 capsule; Refill: 3  5. Spinal stenosis of lumbar region, unspecified whether neurogenic claudication present Short term prescription for tramadol given to take as needed and as prescribed. Patient to follow up with orthopedic provider for further evaluation and treatment.  - traMADol (ULTRAM) 50 MG tablet; Take 1 tablet (50 mg total) by mouth every 6 (six) hours as needed.  Dispense: 20 tablet; Refill: 0  6. Generalized anxiety disorder May take alprazolam as needed and as prescribed. New prescription sent to her pharmacy.  - ALPRAZolam (XANAX) 0.25 MG tablet; Take 1 tablet (0.25 mg total) by mouth 2 (two) times daily as needed for anxiety.  Dispense: 60  tablet; Refill: 1  7. Fever blister - famciclovir (FAMVIR) 500 MG tablet; Take 1 tablet (500 mg total) by mouth 2 (two) times daily.  Dispense: 60 tablet; Refill: 3  8. Encounter for prescription of oral contraceptives - norethindrone (MICRONOR) 0.35 MG tablet; Take 1 tablet (0.35 mg total) by mouth daily.  Dispense: 84 tablet; Refill: 3  9. Dysuria - Urinalysis, Routine w reflex microscopic  General Counseling: Markham Jordan understanding of the findings of todays visit and agrees with plan of treatment. I have discussed any further diagnostic evaluation that may be needed or ordered today. We also reviewed her medications today. she has been encouraged to call the office with any questions or concerns that should arise related to todays visit.    Counseling:   Hypertension Counseling:   The following hypertensive lifestyle modification were recommended and discussed:  1. Limiting alcohol intake to less than 1 oz/day of ethanol:(24 oz of beer or 8 oz of wine or 2 oz of 100-proof whiskey). 2. Take baby ASA 81 mg daily. 3. Importance of regular  aerobic exercise and losing weight. 4. Reduce dietary saturated fat and cholesterol intake for overall cardiovascular health. 5. Maintaining adequate dietary potassium, calcium, and magnesium intake. 6. Regular monitoring of the blood pressure. 7. Reduce sodium intake to less than 100 mmol/day (less than 2.3 gm of sodium or less than 6 gm of sodium choride)   This patient was seen by [place] with Dr Lavera Guise as a part of collaborative care agreement  Orders Placed This Encounter  Procedures  . Urinalysis, Routine w reflex microscopic    Meds ordered this encounter  Medications  . diltiazem (CARDIZEM CD) 120 MG 24 hr capsule    Sig: TAKE 1 CAPSULE BY MOUTH AT BEDTIME FOR HEART RATE    Dispense:  90 capsule    Refill:  1    Order Specific Question:   Supervising Provider    Answer:   Lavera Guise [place]  . ALPRAZolam (XANAX) 0.25 MG tablet    Sig: Take 1 tablet (0.25 mg total) by mouth 2 (two) times daily as needed for anxiety.    Dispense:  60 tablet    Refill:  1    Order Specific Question:   Supervising Provider    Answer:   Lavera Guise [[id number]]  . norethindrone (MICRONOR) 0.35 MG tablet    Sig: Take 1 tablet (0.35 mg total) by mouth daily.    Dispense:  84 tablet    Refill:  3    Order Specific Question:   Supervising Provider    Answer:   Lavera Guise [[id number]]  . omeprazole (PRILOSEC) 40 MG capsule    Sig: TAKE ONE CAPSULE BY MOUTH EVERY DAY FOR HEART BURN    Dispense:  90 capsule    Refill:  3    Order Specific Question:   Supervising Provider    Answer:   Lavera Guise [[id number]]  . triamterene-hydrochlorothiazide (MAXZIDE-25) 37.5-25 MG tablet    Sig: Take 1 tablet by mouth daily.    Dispense:  90 tablet    Refill:  3    Order Specific Question:   Supervising Provider    Answer:   Lavera Guise [[id number]]  . famciclovir (FAMVIR) 500 MG tablet    Sig: Take 1 tablet (500 mg total) by mouth 2 (two) times daily.    Dispense:  60 tablet    Refill:   3  Order Specific Question:   Supervising Provider    Answer:   Lavera Guise [[id number]]  . traMADol (ULTRAM) 50 MG tablet    Sig: Take 1 tablet (50 mg total) by mouth every 6 (six) hours as needed.    Dispense:  20 tablet    Refill:  0    Order Specific Question:   Supervising Provider    Answer:   Lavera Guise [[id number]]  . fluconazole (DIFLUCAN) 150 MG tablet    Sig: Take 1 tablet po once. May repeat dose in 3 days as needed for persistent symptoms.    Dispense:  3 tablet    Refill:  1    Order Specific Question:   Supervising Provider    Answer:   Lavera Guise [[id number]]    Time spent: [place], MD  Internal Medicine

## 2019-07-04 LAB — URINALYSIS, ROUTINE W REFLEX MICROSCOPIC
Bilirubin, UA: NEGATIVE
Glucose, UA: NEGATIVE
Ketones, UA: NEGATIVE
Leukocytes,UA: NEGATIVE
Nitrite, UA: NEGATIVE
Protein,UA: NEGATIVE
RBC, UA: NEGATIVE
Specific Gravity, UA: 1.015 (ref 1.005–1.030)
Urobilinogen, Ur: 0.2 mg/dL (ref 0.2–1.0)
pH, UA: 5 (ref 5.0–7.5)

## 2019-08-08 ENCOUNTER — Other Ambulatory Visit: Payer: Self-pay | Admitting: Nurse Practitioner

## 2019-08-09 LAB — LIPID PANEL W/O CHOL/HDL RATIO
Cholesterol, Total: 194 mg/dL (ref 100–199)
HDL: 40 mg/dL (ref >39)
LDL Chol Calc (NIH): 117 mg/dL — ABNORMAL HIGH (ref 0–99)
Triglycerides: 212 mg/dL — ABNORMAL HIGH (ref 0–149)
VLDL Cholesterol Cal: 37 mg/dL (ref 5–40)

## 2019-08-09 LAB — COMPREHENSIVE METABOLIC PANEL
ALT: 28 IU/L (ref 0–32)
AST: 18 IU/L (ref 0–40)
Albumin/Globulin Ratio: 1.9 (ref 1.2–2.2)
Albumin: 5 g/dL — ABNORMAL HIGH (ref 3.8–4.8)
Alkaline Phosphatase: 111 IU/L (ref 39–117)
BUN/Creatinine Ratio: 18 (ref 9–23)
BUN: 22 mg/dL (ref 6–24)
Bilirubin Total: 0.8 mg/dL (ref 0.0–1.2)
CO2: 20 mmol/L (ref 20–29)
Calcium: 10.6 mg/dL — ABNORMAL HIGH (ref 8.7–10.2)
Chloride: 101 mmol/L (ref 96–106)
Creatinine, Ser: 1.21 mg/dL — ABNORMAL HIGH (ref 0.57–1.00)
GFR calc Af Amer: 60 mL/min/{1.73_m2} (ref >59)
GFR calc non Af Amer: 52 mL/min/{1.73_m2} — ABNORMAL LOW (ref >59)
Globulin, Total: 2.6 g/dL (ref 1.5–4.5)
Glucose: 107 mg/dL — ABNORMAL HIGH (ref 65–99)
Potassium: 5.1 mmol/L (ref 3.5–5.2)
Sodium: 139 mmol/L (ref 134–144)
Total Protein: 7.6 g/dL (ref 6.0–8.5)

## 2019-08-09 LAB — CBC
Hematocrit: 45.8 % (ref 34.0–46.6)
Hemoglobin: 16.1 g/dL — ABNORMAL HIGH (ref 11.1–15.9)
MCH: 32.7 pg (ref 26.6–33.0)
MCHC: 35.2 g/dL (ref 31.5–35.7)
MCV: 93 fL (ref 79–97)
Platelets: 321 10*3/uL (ref 150–450)
RBC: 4.92 x10E6/uL (ref 3.77–5.28)
RDW: 12.3 % (ref 11.7–15.4)
WBC: 8.7 10*3/uL (ref 3.4–10.8)

## 2019-08-09 LAB — HGB A1C W/O EAG: Hgb A1c MFr Bld: 5 % (ref 4.8–5.6)

## 2019-08-09 LAB — TSH: TSH: 1.36 u[IU]/mL (ref 0.450–4.500)

## 2019-08-09 LAB — VITAMIN D 25 HYDROXY (VIT D DEFICIENCY, FRACTURES): Vit D, 25-Hydroxy: 23.6 ng/mL — ABNORMAL LOW (ref 30.0–100.0)

## 2019-08-09 LAB — T4, FREE: Free T4: 1.34 ng/dL (ref 0.82–1.77)

## 2019-08-22 NOTE — Progress Notes (Signed)
I see her back 10/19

## 2019-08-27 ENCOUNTER — Other Ambulatory Visit: Payer: Self-pay

## 2019-08-27 ENCOUNTER — Ambulatory Visit: Payer: Commercial Managed Care - PPO | Admitting: Nurse Practitioner

## 2019-08-27 ENCOUNTER — Encounter: Payer: Self-pay | Admitting: Nurse Practitioner

## 2019-08-27 DIAGNOSIS — E559 Vitamin D deficiency, unspecified: Secondary | ICD-10-CM

## 2019-08-27 DIAGNOSIS — N289 Disorder of kidney and ureter, unspecified: Secondary | ICD-10-CM | POA: Diagnosis not present

## 2019-08-27 MED ORDER — ERGOCALCIFEROL 1.25 MG (50000 UT) PO CAPS: 50000.0000 [IU] | ORAL_CAPSULE | ORAL | 5 refills | Status: DC

## 2019-08-27 NOTE — Progress Notes (Signed)
[facility] [place], [place] [address]  Internal MEDICINE  Telephone Visit  Patient Name: Pam Khan  [id number]  [id number]  Date of Service: 09/09/2019  I connected with the patient at 4:51pm by webcam and verified the patients identity using two identifiers.   I discussed the limitations, risks, security and privacy concerns of performing an evaluation and management service by webcam and the availability of in person appointments. I also discussed with the patient that there may be a patient responsible charge related to the service.  The patient expressed understanding and agrees to proceed.    Chief Complaint  Patient presents with  . Telephone Assessment  . Telephone Screen  . Follow-up    review labs    The patient has been contacted via webcam for follow up visit due to concerns for spread of novel coronavirus. She is following up for abnormal lab results. Recently CMP is showing decreased renal functions and elevated calcium level. She is also vitamin d deficiency. Lipid panel indicates triglyceride level of 212, however, LDL and total cholesterol levels are normal.       Current Medication: Outpatient Encounter Medications as of 08/27/2019  Medication Sig  . ALPRAZolam (XANAX) 0.25 MG tablet Take 1 tablet (0.25 mg total) by mouth 2 (two) times daily as needed for anxiety.  Marland Kitchen diltiazem (CARDIZEM CD) 120 MG 24 hr capsule TAKE 1 CAPSULE BY MOUTH AT BEDTIME FOR HEART RATE  . diptheria-tetanus toxoids (TDVAX) 2-2 LF/0.5ML injection TDVAX 2 Lf unit-2 Lf unit/0.5 mL intramuscular suspension  . famciclovir (FAMVIR) 500 MG tablet Take 1 tablet (500 mg total) by mouth 2 (two) times daily.  . fluconazole (DIFLUCAN) 150 MG tablet Take 1 tablet po once. May repeat dose in 3 days as needed for persistent symptoms.  . norethindrone (MICRONOR) 0.35 MG tablet Take 1 tablet (0.35 mg total) by mouth daily.  Marland Kitchen omeprazole (PRILOSEC) 40 MG capsule TAKE ONE  CAPSULE BY MOUTH EVERY DAY FOR HEART BURN  . traMADol (ULTRAM) 50 MG tablet Take 1 tablet (50 mg total) by mouth every 6 (six) hours as needed.  . triamterene-hydrochlorothiazide (MAXZIDE-25) 37.5-25 MG tablet Take 1 tablet by mouth daily.  Marland Kitchen Zoster Vaccine Adjuvanted [facility]) injection Shingrix (PF) 50 mcg/0.5 mL intramuscular suspension, kit  . ergocalciferol (DRISDOL) 1.25 MG (50000 UT) capsule Take 1 capsule (50,000 Units total) by mouth once a week.   No facility-administered encounter medications on file as of 08/27/2019.     Surgical History: Past Surgical History:  Procedure Laterality Date  . BACK SURGERY  1985  . CESAREAN SECTION  1989  . COSMETIC SURGERY  1985   head  . CYSTOSCOPY WITH STENT PLACEMENT Left 05/17/2019   Procedure: CYSTOSCOPY WITH STENT PLACEMENT;  Surgeon: Hollice Espy, MD;  Location: [facility] ORS;  Service: Urology;  Laterality: Left;  . CYSTOSCOPY/URETEROSCOPY/HOLMIUM LASER/STENT PLACEMENT Left 05/25/2019   Procedure: CYSTOSCOPY/URETEROSCOPY/STENT Exchange;  Surgeon: Hollice Espy, MD;  Location: [facility] ORS;  Service: Urology;  Laterality: Left;  . DILATION AND CURETTAGE OF UTERUS  2007    Medical History: Past Medical History:  Diagnosis Date  . Anxiety   . Asthma   . Barrett esophagus   . DDD (degenerative disc disease), lumbar   . Family history of adverse reaction to anesthesia    FATHER ALWAYS TROUBLE WAKING UP. WITH NECK SURGERY 2008/ASPIRATED EVENTUALLY DIED PNEUMONIA ETC  . GERD (gastroesophageal reflux disease)   . Hypertension   . Spinal stenosis of lumbar region  Family History: Family History  Problem Relation Age of Onset  . Breast cancer Mother 34  . Breast cancer Maternal Grandmother 60  . Diabetes Father   . Heart attack Father     Social History   Socioeconomic History  . Marital status: Married    Spouse name: Not on file  . Number of children: Not on file  . Years of education: Not on file  . Highest education  level: Not on file  Occupational History  . Not on file  Social Needs  . Financial resource strain: Not on file  . Food insecurity    Worry: Not on file    Inability: Not on file  . Transportation needs    Medical: Not on file    Non-medical: Not on file  Tobacco Use  . Smoking status: Never Smoker  . Smokeless tobacco: Never Used  Substance and Sexual Activity  . Alcohol use: Yes    Frequency: Never    Comment: occasionally  . Drug use: No  . Sexual activity: Not on file  Lifestyle  . Physical activity    Days per week: Not on file    Minutes per session: Not on file  . Stress: Not on file  Relationships  . Social Herbalist on phone: Not on file    Gets together: Not on file    Attends religious service: Not on file    Active member of club or organization: Not on file    Attends meetings of clubs or organizations: Not on file    Relationship status: Not on file  . Intimate partner violence    Fear of current or ex partner: Not on file    Emotionally abused: Not on file    Physically abused: Not on file    Forced sexual activity: Not on file  Other Topics Concern  . Not on file  Social History Narrative  . Not on file      Review of Systems  Constitutional: Negative for activity change, chills, fatigue and unexpected weight change.  HENT: Negative for congestion, postnasal drip, rhinorrhea, sneezing and sore throat.   Respiratory: Negative for cough, chest tightness, shortness of breath and wheezing.   Cardiovascular: Negative for chest pain and palpitations.  Gastrointestinal: Negative for abdominal pain, constipation, diarrhea, nausea and vomiting.  Endocrine: Negative for cold intolerance, heat intolerance, polydipsia and polyuria.  Musculoskeletal: Negative for arthralgias, back pain, joint swelling and neck pain.  Skin: Negative for rash.  Allergic/Immunologic: Negative for environmental allergies.  Neurological: Negative for dizziness,  tremors, numbness and headaches.  Hematological: Negative for adenopathy. Does not bruise/bleed easily.  Psychiatric/Behavioral: Negative for behavioral problems (Depression), sleep disturbance and suicidal ideas. The patient is not nervous/anxious.     Vital Signs: There were no vitals taken for this visit.   Observation/Objective:   The patient is alert and oriented. She is pleasant and answers all questions appropriately. Breathing is non-labored. She is in no acute distress at this time.    Assessment/Plan: 1. Hypercalcemia Calcium level 10.6 with recent labs. Sending lab slip to recheck along with intact PTH and thyroid penal .  2. Impaired renal function Recheck renal functions along with labs.   3. Vitamin D deficiency - ergocalciferol (DRISDOL) 1.25 MG (50000 UT) capsule; Take 1 capsule (50,000 Units total) by mouth once a week.  Dispense: 4 capsule; Refill: 5  General Counseling: Markham Jordan understanding of the findings of today's phone visit and agrees with  plan of treatment. I have discussed any further diagnostic evaluation that may be needed or ordered today. We also reviewed her medications today. she has been encouraged to call the office with any questions or concerns that should arise related to todays visit.   This patient was seen by [place] with Dr Lavera Guise as a part of collaborative care agreement  Meds ordered this encounter  Medications  . ergocalciferol (DRISDOL) 1.25 MG (50000 UT) capsule    Sig: Take 1 capsule (50,000 Units total) by mouth once a week.    Dispense:  4 capsule    Refill:  5    Order Specific Question:   Supervising Provider    Answer:   Lavera Guise [[id number]]    Time spent: 87 Minutes    Dr Lavera Guise Internal medicine

## 2019-08-28 ENCOUNTER — Telehealth: Payer: Self-pay

## 2019-08-28 NOTE — Telephone Encounter (Signed)
Mailed labslip 

## 2019-09-09 DIAGNOSIS — E559 Vitamin D deficiency, unspecified: Secondary | ICD-10-CM | POA: Insufficient documentation

## 2019-09-09 DIAGNOSIS — N289 Disorder of kidney and ureter, unspecified: Secondary | ICD-10-CM | POA: Insufficient documentation

## 2019-09-18 ENCOUNTER — Ambulatory Visit: Payer: Commercial Managed Care - PPO | Admitting: Nurse Practitioner

## 2019-09-19 ENCOUNTER — Telehealth: Payer: Self-pay

## 2019-09-19 NOTE — Telephone Encounter (Signed)
Left message following up on missed appt. Pam Khan

## 2019-10-26 ENCOUNTER — Telehealth: Payer: Self-pay

## 2019-10-26 NOTE — Telephone Encounter (Signed)
VM FULL UNABLE TO LEAVE MESSAGE TO  CONFIRM 10-30-19 OV.

## 2019-10-30 ENCOUNTER — Ambulatory Visit: Payer: Commercial Managed Care - PPO | Admitting: Nurse Practitioner

## 2020-02-11 ENCOUNTER — Other Ambulatory Visit: Payer: Self-pay

## 2020-02-11 DIAGNOSIS — E559 Vitamin D deficiency, unspecified: Secondary | ICD-10-CM

## 2020-02-11 MED ORDER — ERGOCALCIFEROL 1.25 MG (50000 UT) PO CAPS: 50000.0000 [IU] | ORAL_CAPSULE | ORAL | 5 refills | Status: DC

## 2020-02-21 ENCOUNTER — Other Ambulatory Visit: Payer: Self-pay

## 2020-02-21 ENCOUNTER — Encounter: Payer: Self-pay | Admitting: Physician Assistant

## 2020-02-21 ENCOUNTER — Ambulatory Visit (INDEPENDENT_AMBULATORY_CARE_PROVIDER_SITE_OTHER): Payer: Commercial Managed Care - PPO | Admitting: Physician Assistant

## 2020-02-21 VITALS — BP 153/88 | HR 111 | Temp 97.9°F | Ht 66.0 in | Wt 176.0 lb

## 2020-02-21 DIAGNOSIS — Z87442 Personal history of urinary calculi: Secondary | ICD-10-CM | POA: Diagnosis not present

## 2020-02-21 DIAGNOSIS — R109 Unspecified abdominal pain: Secondary | ICD-10-CM

## 2020-02-21 LAB — MICROSCOPIC EXAMINATION: RBC, Urine: NONE SEEN /hpf (ref 0–2)

## 2020-02-21 LAB — URINALYSIS, COMPLETE
Bilirubin, UA: NEGATIVE
Glucose, UA: NEGATIVE
Ketones, UA: NEGATIVE
Nitrite, UA: POSITIVE — AB
Specific Gravity, UA: 1.02 (ref 1.005–1.030)
Urobilinogen, Ur: 0.2 mg/dL (ref 0.2–1.0)
pH, UA: 5.5 (ref 5.0–7.5)

## 2020-02-21 MED ORDER — SULFAMETHOXAZOLE-TRIMETHOPRIM 800-160 MG PO TABS: 1.0000 | ORAL_TABLET | Freq: Two times a day (BID) | ORAL | 0 refills | Status: AC

## 2020-02-21 NOTE — Patient Instructions (Addendum)
Start antibiotics today and stay well hydrated. Contact our office if you don't begin to feel better within 72 hours.   The imaging department will contact you to schedule your kidney ultrasound.  I will contact you if I need to switch your antibiotics per your urine culture results.

## 2020-02-21 NOTE — Progress Notes (Signed)
02/21/2020 2:12 PM   Pam Khan Oct 15, 1971 [id number]  CC: Left flank pain  HPI: Pam Khan is a 49 y.o. female with PMH nephrolithiasis with urosepsis in 2020 who presents today for evaluation of possible UTI.  She underwent definitive stone management on 05/25/2019, follow-up renal ultrasound on 06/08/2019 revealed no residual stone burden.  Today, she reports a 2-day history of low-grade fever around 99-100 F, chills, nausea, and left flank pain.  She denies dysuria, frequency, urgency, gross hematuria, and vomiting.  She has not been taking Pyridium.  She did take NyQuil for management of her symptoms, most recently last night.  Additionally, she reports an episode approximately 1 month ago with a sharp twinge in her left flank.  She wonders if this may have represented a stone episode.  In-office UA today positive for trace-intact blood, trace protein, nitrites, and 1+ leukocyte esterase; urine microscopy with 11-30 WBCs/HPF and many bacteria.  PMH: Past Medical History:  Diagnosis Date  . Anxiety   . Asthma   . Barrett esophagus   . DDD (degenerative disc disease), lumbar   . Family history of adverse reaction to anesthesia    FATHER ALWAYS TROUBLE WAKING UP. WITH NECK SURGERY 2008/ASPIRATED EVENTUALLY DIED PNEUMONIA ETC  . GERD (gastroesophageal reflux disease)   . Hypertension   . Spinal stenosis of lumbar region     Surgical History: Past Surgical History:  Procedure Laterality Date  . BACK SURGERY  1985  . CESAREAN SECTION  1989  . COSMETIC SURGERY  1985   head  . CYSTOSCOPY WITH STENT PLACEMENT Left 05/17/2019   Procedure: CYSTOSCOPY WITH STENT PLACEMENT;  Surgeon: Hollice Espy, MD;  Location: [facility] ORS;  Service: Urology;  Laterality: Left;  . CYSTOSCOPY/URETEROSCOPY/HOLMIUM LASER/STENT PLACEMENT Left 05/25/2019   Procedure: CYSTOSCOPY/URETEROSCOPY/STENT Exchange;  Surgeon: Hollice Espy, MD;  Location: [facility] ORS;  Service: Urology;  Laterality: Left;  .  DILATION AND CURETTAGE OF UTERUS  2007    Home Medications:  Allergies as of 02/21/2020   No Known Allergies     Medication List       Accurate as of February 21, 2020  2:12 PM. If you have any questions, ask your nurse or doctor.        ALPRAZolam 0.25 MG tablet Commonly known as: XANAX Take 1 tablet (0.25 mg total) by mouth 2 (two) times daily as needed for anxiety.   diltiazem 120 MG 24 hr capsule Commonly known as: CARDIZEM CD TAKE 1 CAPSULE BY MOUTH AT BEDTIME FOR HEART RATE   ergocalciferol 1.25 MG (50000 UT) capsule Commonly known as: Drisdol Take 1 capsule (50,000 Units total) by mouth once a week.   famciclovir 500 MG tablet Commonly known as: FAMVIR Take 1 tablet (500 mg total) by mouth 2 (two) times daily.   fluconazole 150 MG tablet Commonly known as: DIFLUCAN Take 1 tablet po once. May repeat dose in 3 days as needed for persistent symptoms.   norethindrone 0.35 MG tablet Commonly known as: MICRONOR Take 1 tablet (0.35 mg total) by mouth daily.   omeprazole 40 MG capsule Commonly known as: PRILOSEC TAKE ONE CAPSULE BY MOUTH EVERY DAY FOR HEART BURN   Shingrix injection Generic drug: Zoster Vaccine Adjuvanted Shingrix (PF) 50 mcg/0.5 mL intramuscular suspension, kit   sulfamethoxazole-trimethoprim 800-160 MG tablet Commonly known as: BACTRIM DS Take 1 tablet by mouth 2 (two) times daily for 10 days. Started by: Debroah Loop, PA-C   TDVax 2-2 LF/0.5ML injection Generic drug: diptheria-tetanus toxoids TDVAX  2 Lf unit-2 Lf unit/0.5 mL intramuscular suspension   traMADol 50 MG tablet Commonly known as: Ultram Take 1 tablet (50 mg total) by mouth every 6 (six) hours as needed.   triamterene-hydrochlorothiazide 37.5-25 MG tablet Commonly known as: MAXZIDE-25 Take 1 tablet by mouth daily.       Allergies:  No Known Allergies  Family History: Family History  Problem Relation Age of Onset  . Breast cancer Mother 19  . Breast cancer  Maternal Grandmother 60  . Diabetes Father   . Heart attack Father     Social History:   reports that she has never smoked. She has never used smokeless tobacco. She reports current alcohol use. She reports that she does not use drugs.  Physical Exam: BP (!) 153/88   Pulse (!) 111   Temp 97.9 F (36.6 C) (Skin)   Ht '5\' 6"'$  (1.676 m)   Wt 176 lb (79.8 kg)   BMI 28.41 kg/m   Constitutional:  Alert and oriented, no acute distress, nontoxic appearing HEENT: [place], AT Cardiovascular: No clubbing, cyanosis, or edema Respiratory: Normal respiratory effort, no increased work of breathing GU: No CVA tenderness Skin: No rashes, bruises or suspicious lesions Neurologic: Grossly intact, no focal deficits, moving all 4 extremities Psychiatric: Normal mood and affect  Laboratory Data: Results for orders placed or performed in visit on 02/21/20  Microscopic Examination   URINE  Result Value Ref Range   WBC, UA 11-30 (A) 0 - 5 /hpf   RBC None seen 0 - 2 /hpf   Epithelial Cells (non renal) 0-10 0 - 10 /hpf   Bacteria, UA Many (A) None seen/Few  Urinalysis, Complete  Result Value Ref Range   Specific Gravity, UA 1.020 1.005 - 1.030   pH, UA 5.5 5.0 - 7.5   Color, UA Orange Yellow   Appearance Ur Cloudy (A) Clear   Leukocytes,UA 1+ (A) Negative   Protein,UA Trace (A) Negative/Trace   Glucose, UA Negative Negative   Ketones, UA Negative Negative   RBC, UA Trace (A) Negative   Bilirubin, UA Negative Negative   Urobilinogen, Ur 0.2 0.2 - 1.0 mg/dL   Nitrite, UA Positive (A) Negative   Microscopic Examination See below:    Assessment & Plan:   1. Flank pain with history of urolithiasis 49 year old female with a recent history of nephrolithiasis with associated urosepsis presents with a 2-day history of left flank pain, chills, and low-grade temperature without irritative voiding symptoms.  She is afebrile, VSS in clinic today.  UA grossly infected, will send for culture.  Will start patient  on empiric Bactrim DS twice daily x10 days for management of suspected mild left pyelonephritis.  Counseled patient to contact our office if she does not begin to feel better within 72 hours.  Would like to obtain renal ultrasound to ensure no new renal stones.  She expressed understanding. - Urinalysis, Complete - CULTURE, URINE COMPREHENSIVE - sulfamethoxazole-trimethoprim (BACTRIM DS) 800-160 MG tablet; Take 1 tablet by mouth 2 (two) times daily for 10 days.  Dispense: 20 tablet; Refill: 0 - US RENAL   Return if symptoms worsen or fail to improve.  Debroah Loop, PA-C  [facility] [address] [place], [place] [address] [phone]

## 2020-02-25 LAB — CULTURE, URINE COMPREHENSIVE

## 2020-04-22 ENCOUNTER — Other Ambulatory Visit: Payer: Self-pay | Admitting: Adult Health

## 2020-04-22 DIAGNOSIS — I10 Essential (primary) hypertension: Secondary | ICD-10-CM

## 2020-04-29 NOTE — Telephone Encounter (Signed)
Patient has upcoming appt on 05-22-20

## 2020-05-20 ENCOUNTER — Telehealth: Payer: Self-pay

## 2020-05-20 NOTE — Telephone Encounter (Signed)
CONFIRMED PATIENT APPT. -AR °

## 2020-05-22 ENCOUNTER — Ambulatory Visit: Payer: Commercial Managed Care - PPO | Admitting: Nurse Practitioner

## 2020-05-22 ENCOUNTER — Encounter: Payer: Self-pay | Admitting: Nurse Practitioner

## 2020-05-22 ENCOUNTER — Other Ambulatory Visit: Payer: Self-pay

## 2020-05-22 VITALS — BP 146/82 | HR 83 | Temp 97.9°F | Resp 16 | Ht 66.0 in | Wt 171.8 lb

## 2020-05-22 DIAGNOSIS — J4520 Mild intermittent asthma, uncomplicated: Secondary | ICD-10-CM

## 2020-05-22 DIAGNOSIS — I10 Essential (primary) hypertension: Secondary | ICD-10-CM

## 2020-05-22 DIAGNOSIS — Z1231 Encounter for screening mammogram for malignant neoplasm of breast: Secondary | ICD-10-CM | POA: Diagnosis not present

## 2020-05-22 DIAGNOSIS — K219 Gastro-esophageal reflux disease without esophagitis: Secondary | ICD-10-CM | POA: Diagnosis not present

## 2020-05-22 MED ORDER — OMEPRAZOLE 40 MG PO CPDR: DELAYED_RELEASE_CAPSULE | ORAL | 3 refills | Status: DC

## 2020-05-22 MED ORDER — ALBUTEROL SULFATE HFA 108 (90 BASE) MCG/ACT IN AERS: 2.0000 | INHALATION_SPRAY | Freq: Four times a day (QID) | RESPIRATORY_TRACT | 3 refills | Status: DC | PRN

## 2020-05-22 NOTE — Progress Notes (Signed)
[facility] [place], [place] [address]  Internal MEDICINE  Office Visit Note  Patient Name: Pam Khan  [id number]  [id number]  Date of Service: 06/01/2020  Chief Complaint  Patient presents with   Follow-up   Gastroesophageal Reflux   Hypertension   Quality Metric Gaps    TDAP    The patient is here for routine follow up. She states that she is doing well. Her blood pressure is mildly elevated. Does take diuretic. Blood pressure generally runs around 140/80.   Has history of exercise induced asthma. Used to be on Advair for prevention. She took as needed. This medication got very expensive so she stopped using it. States that during times of exertion, she still has episodes of coughing and wheezing. Does not and has not used a rescue inhaler. Wanted to know if there were other options to help her breathing.       Current Medication: Outpatient Encounter Medications as of 05/22/2020  Medication Sig   albuterol (VENTOLIN HFA) 108 (90 Base) MCG/ACT inhaler Inhale 2 puffs into the lungs every 6 (six) hours as needed for wheezing or shortness of breath.   diltiazem (CARDIZEM CD) 120 MG 24 hr capsule TAKE 1 CAPSULE BY MOUTH AT BEDTIME FOR HEART RATE   diptheria-tetanus toxoids (TDVAX) 2-2 LF/0.5ML injection TDVAX 2 Lf unit-2 Lf unit/0.5 mL intramuscular suspension   ergocalciferol (DRISDOL) 1.25 MG (50000 UT) capsule Take 1 capsule (50,000 Units total) by mouth once a week.   famciclovir (FAMVIR) 500 MG tablet Take 1 tablet (500 mg total) by mouth 2 (two) times daily.   norethindrone (MICRONOR) 0.35 MG tablet Take 1 tablet (0.35 mg total) by mouth daily.   omeprazole (PRILOSEC) 40 MG capsule TAKE ONE CAPSULE BY MOUTH EVERY DAY FOR HEART BURN   triamterene-hydrochlorothiazide (MAXZIDE-25) 37.5-25 MG tablet Take 1 tablet by mouth daily.   Zoster Vaccine Adjuvanted [facility]) injection Shingrix (PF) 50 mcg/0.5 mL intramuscular suspension, kit    [DISCONTINUED] ALPRAZolam (XANAX) 0.25 MG tablet Take 1 tablet (0.25 mg total) by mouth 2 (two) times daily as needed for anxiety.   [DISCONTINUED] fluconazole (DIFLUCAN) 150 MG tablet Take 1 tablet po once. May repeat dose in 3 days as needed for persistent symptoms.   [DISCONTINUED] omeprazole (PRILOSEC) 40 MG capsule TAKE ONE CAPSULE BY MOUTH EVERY DAY FOR HEART BURN   [DISCONTINUED] traMADol (ULTRAM) 50 MG tablet Take 1 tablet (50 mg total) by mouth every 6 (six) hours as needed.   No facility-administered encounter medications on file as of 05/22/2020.    Surgical History: Past Surgical History:  Procedure Laterality Date   [place]   head   CYSTOSCOPY WITH STENT PLACEMENT Left 05/17/2019   Procedure: CYSTOSCOPY WITH STENT PLACEMENT;  Surgeon: Hollice Espy, MD;  Location: [facility] ORS;  Service: Urology;  Laterality: Left;   CYSTOSCOPY/URETEROSCOPY/HOLMIUM LASER/STENT PLACEMENT Left 05/25/2019   Procedure: CYSTOSCOPY/URETEROSCOPY/STENT Exchange;  Surgeon: Hollice Espy, MD;  Location: [facility] ORS;  Service: Urology;  Laterality: Left;   DILATION AND CURETTAGE OF UTERUS  2007    Medical History: Past Medical History:  Diagnosis Date   Anxiety    Asthma    Barrett esophagus    DDD (degenerative disc disease), lumbar    Family history of adverse reaction to anesthesia    FATHER ALWAYS TROUBLE WAKING UP. WITH NECK SURGERY 2008/ASPIRATED EVENTUALLY DIED PNEUMONIA ETC   GERD (gastroesophageal reflux disease)  Hypertension    Spinal stenosis of lumbar region     Family History: Family History  Problem Relation Age of Onset   Breast cancer Mother 12   Breast cancer Maternal Grandmother 54   Diabetes Father    Heart attack Father     Social History   Socioeconomic History   Marital status: Married    Spouse name: Not on file   Number of children: Not on file   Years of education: Not on file    Highest education level: Not on file  Occupational History   Not on file  Tobacco Use   Smoking status: Never Smoker   Smokeless tobacco: Never Used  Vaping Use   Vaping Use: Never used  Substance and Sexual Activity   Alcohol use: Yes    Comment: occasionally   Drug use: No   Sexual activity: Not on file  Other Topics Concern   Not on file  Social History Narrative   Not on file   Social Determinants of Health   Financial Resource Strain:    Difficulty of Paying Living Expenses:   Food Insecurity:    Worried About Charity fundraiser in the Last Year:    Arboriculturist in the Last Year:   Transportation Needs:    Film/video editor (Medical):    Lack of Transportation (Non-Medical):   Physical Activity:    Days of Exercise per Week:    Minutes of Exercise per Session:   Stress:    Feeling of Stress :   Social Connections:    Frequency of Communication with Friends and Family:    Frequency of Social Gatherings with Friends and Family:    Attends Religious Services:    Active Member of Clubs or Organizations:    Attends Music therapist:    Marital Status:   Intimate Partner Violence:    Fear of Current or Ex-Partner:    Emotionally Abused:    Physically Abused:    Sexually Abused:       Review of Systems  Constitutional: Positive for fatigue. Negative for activity change, chills and unexpected weight change.  HENT: Negative for congestion, postnasal drip, rhinorrhea, sneezing and sore throat.   Respiratory: Positive for choking and wheezing. Negative for cough, chest tightness and shortness of breath.        Mostly with exertion.   Cardiovascular: Negative for chest pain and palpitations.  Gastrointestinal: Negative for abdominal pain, constipation, diarrhea, nausea and vomiting.  Endocrine: Negative for cold intolerance, heat intolerance, polydipsia and polyuria.  Musculoskeletal: Negative for arthralgias, back  pain, joint swelling and neck pain.  Skin: Negative for rash.  Allergic/Immunologic: Negative for environmental allergies.  Neurological: Negative for dizziness, tremors, numbness and headaches.  Hematological: Negative for adenopathy. Does not bruise/bleed easily.  Psychiatric/Behavioral: Negative for behavioral problems (Depression), sleep disturbance and suicidal ideas. The patient is not nervous/anxious.    Today's Vitals   05/22/20 0937  BP: (!) 146/82  Pulse: 83  Resp: 16  Temp: 97.9 F (36.6 C)  SpO2: 98%  Weight: 171 lb 12.8 oz (77.9 kg)  Height: '5\' 6"'$  (1.676 m)   Body mass index is 27.73 kg/m.  Physical Exam Vitals and nursing note reviewed.  Constitutional:      General: She is not in acute distress.    Appearance: Normal appearance. She is well-developed. She is not diaphoretic.  HENT:     Head: Normocephalic and atraumatic.     Nose: Nose  normal.     Mouth/Throat:     Pharynx: No oropharyngeal exudate.  Eyes:     Pupils: Pupils are equal, round, and reactive to light.  Neck:     Thyroid: No thyromegaly.     Vascular: No JVD.     Trachea: No tracheal deviation.  Cardiovascular:     Rate and Rhythm: Normal rate and regular rhythm.     Heart sounds: Normal heart sounds. No murmur heard.  No friction rub. No gallop.   Pulmonary:     Effort: Pulmonary effort is normal. No respiratory distress.     Breath sounds: Normal breath sounds. No wheezing or rales.  Chest:     Chest wall: No tenderness.  Abdominal:     Palpations: Abdomen is soft.  Musculoskeletal:        General: Normal range of motion.     Cervical back: Normal range of motion and neck supple.  Lymphadenopathy:     Cervical: No cervical adenopathy.  Skin:    General: Skin is warm and dry.  Neurological:     Mental Status: She is alert and oriented to person, place, and time.     Cranial Nerves: No cranial nerve deficit.  Psychiatric:        Behavior: Behavior normal.        Thought Content:  Thought content normal.        Judgment: Judgment normal.    Assessment/Plan:  1. Mild intermittent asthma without complication Add albuterol rescue inhaler. Advised her to use two inhalations up to four times daily as needed for SOB and wheezing.  - albuterol (VENTOLIN HFA) 108 (90 Base) MCG/ACT inhaler; Inhale 2 puffs into the lungs every 6 (six) hours as needed for wheezing or shortness of breath.  Dispense: 18 g; Refill: 3  2. Gastroesophageal reflux disease without esophagitis May take omeprazole '40mg'$  daily. Refills ptovided today  - omeprazole (PRILOSEC) 40 MG capsule; TAKE ONE CAPSULE BY MOUTH EVERY DAY FOR HEART BURN  Dispense: 90 capsule; Refill: 3  3. Hypertension, unspecified type Stable. Continue bp medication as prescribed   4. Encounter for screening mammogram for malignant neoplasm of breast - MM DIGITAL SCREENING BILATERAL; Future   General Counseling: kaly mcquary understanding of the findings of todays visit and agrees with plan of treatment. I have discussed any further diagnostic evaluation that may be needed or ordered today. We also reviewed her medications today. she has been encouraged to call the office with any questions or concerns that should arise related to todays visit.  This patient was seen by Leretha Pol FNP Collaboration with Dr Lavera Guise as a part of collaborative care agreement  Orders Placed This Encounter  Procedures   MM DIGITAL SCREENING BILATERAL    Meds ordered this encounter  Medications   omeprazole (PRILOSEC) 40 MG capsule    Sig: TAKE ONE CAPSULE BY MOUTH EVERY DAY FOR HEART BURN    Dispense:  90 capsule    Refill:  3    Order Specific Question:   Supervising Provider    Answer:   Lavera Guise [[id number]]   albuterol (VENTOLIN HFA) 108 (90 Base) MCG/ACT inhaler    Sig: Inhale 2 puffs into the lungs every 6 (six) hours as needed for wheezing or shortness of breath.    Dispense:  18 g    Refill:  3    Order Specific  Question:   Supervising Provider    Answer:   Lavera Guise [place]  Total time spent: 30 Minutes   Time spent includes review of chart, medications, test results, and follow up plan with the patient.      Dr Lavera Guise Internal medicine

## 2020-06-01 DIAGNOSIS — Z1231 Encounter for screening mammogram for malignant neoplasm of breast: Secondary | ICD-10-CM | POA: Insufficient documentation

## 2020-06-01 DIAGNOSIS — J4520 Mild intermittent asthma, uncomplicated: Secondary | ICD-10-CM | POA: Insufficient documentation

## 2020-06-23 ENCOUNTER — Other Ambulatory Visit: Payer: Self-pay

## 2020-06-23 DIAGNOSIS — Z30011 Encounter for initial prescription of contraceptive pills: Secondary | ICD-10-CM

## 2020-06-23 MED ORDER — NORETHINDRONE 0.35 MG PO TABS: 1.0000 | ORAL_TABLET | Freq: Every day | ORAL | 3 refills | Status: DC

## 2020-07-15 ENCOUNTER — Telehealth: Payer: Self-pay

## 2020-07-15 NOTE — Telephone Encounter (Signed)
Confirmed & screened for OV on 9/9 °

## 2020-07-17 ENCOUNTER — Other Ambulatory Visit: Payer: Self-pay

## 2020-07-17 ENCOUNTER — Ambulatory Visit (INDEPENDENT_AMBULATORY_CARE_PROVIDER_SITE_OTHER): Payer: Commercial Managed Care - PPO | Admitting: Nurse Practitioner

## 2020-07-17 VITALS — BP 132/80 | HR 91 | Temp 97.5°F | Resp 16 | Ht 66.0 in | Wt 169.0 lb

## 2020-07-17 DIAGNOSIS — Z0001 Encounter for general adult medical examination with abnormal findings: Secondary | ICD-10-CM

## 2020-07-17 DIAGNOSIS — Z1211 Encounter for screening for malignant neoplasm of colon: Secondary | ICD-10-CM | POA: Diagnosis not present

## 2020-07-17 DIAGNOSIS — J4520 Mild intermittent asthma, uncomplicated: Secondary | ICD-10-CM | POA: Diagnosis not present

## 2020-07-17 DIAGNOSIS — I10 Essential (primary) hypertension: Secondary | ICD-10-CM | POA: Diagnosis not present

## 2020-07-17 DIAGNOSIS — R300 Dysuria: Secondary | ICD-10-CM

## 2020-07-17 NOTE — Progress Notes (Signed)
[facility] [place], [place] [address]  Internal MEDICINE  Office Visit Note  Patient Name: Pam Khan  [id number]  [id number]  Date of Service: 08/02/2020    Pt is here for routine health maintenance examination   Chief Complaint  Patient presents with  . Annual Exam  . Gastroesophageal Reflux  . Hypertension  . Quality Metric Gaps    colonoscopy, flu     The patient presents to the office for health maintenance exam. Her blood pressure is well managed with current medication. She is due to have routine, fasting labs. She also needs to have mammogram ordered. She should also have colon cancer screening. She has no concerns or complaints today.     Current Medication: Outpatient Encounter Medications as of 07/17/2020  Medication Sig  . albuterol (VENTOLIN HFA) 108 (90 Base) MCG/ACT inhaler Inhale 2 puffs into the lungs every 6 (six) hours as needed for wheezing or shortness of breath.  . diptheria-tetanus toxoids (TDVAX) 2-2 LF/0.5ML injection TDVAX 2 Lf unit-2 Lf unit/0.5 mL intramuscular suspension  . ergocalciferol (DRISDOL) 1.25 MG (50000 UT) capsule Take 1 capsule (50,000 Units total) by mouth once a week.  . famciclovir (FAMVIR) 500 MG tablet Take 1 tablet (500 mg total) by mouth 2 (two) times daily.  . norethindrone (MICRONOR) 0.35 MG tablet Take 1 tablet (0.35 mg total) by mouth daily.  Marland Kitchen omeprazole (PRILOSEC) 40 MG capsule TAKE ONE CAPSULE BY MOUTH EVERY DAY FOR HEART BURN  . triamterene-hydrochlorothiazide (MAXZIDE-25) 37.5-25 MG tablet Take 1 tablet by mouth daily.  Marland Kitchen Zoster Vaccine Adjuvanted [facility]) injection Shingrix (PF) 50 mcg/0.5 mL intramuscular suspension, kit  . [DISCONTINUED] diltiazem (CARDIZEM CD) 120 MG 24 hr capsule TAKE 1 CAPSULE BY MOUTH AT BEDTIME FOR HEART RATE   No facility-administered encounter medications on file as of 07/17/2020.    Surgical History: Past Surgical History:  Procedure Laterality Date  . BACK  SURGERY  1985  . CESAREAN SECTION  1989  . COSMETIC SURGERY  1985   head  . CYSTOSCOPY WITH STENT PLACEMENT Left 05/17/2019   Procedure: CYSTOSCOPY WITH STENT PLACEMENT;  Surgeon: Hollice Espy, MD;  Location: [facility] ORS;  Service: Urology;  Laterality: Left;  . CYSTOSCOPY/URETEROSCOPY/HOLMIUM LASER/STENT PLACEMENT Left 05/25/2019   Procedure: CYSTOSCOPY/URETEROSCOPY/STENT Exchange;  Surgeon: Hollice Espy, MD;  Location: [facility] ORS;  Service: Urology;  Laterality: Left;  . DILATION AND CURETTAGE OF UTERUS  2007    Medical History: Past Medical History:  Diagnosis Date  . Anxiety   . Asthma   . Barrett esophagus   . DDD (degenerative disc disease), lumbar   . Family history of adverse reaction to anesthesia    FATHER ALWAYS TROUBLE WAKING UP. WITH NECK SURGERY 2008/ASPIRATED EVENTUALLY DIED PNEUMONIA ETC  . GERD (gastroesophageal reflux disease)   . Hypertension   . Spinal stenosis of lumbar region     Family History: Family History  Problem Relation Age of Onset  . Breast cancer Mother 53  . Breast cancer Maternal Grandmother 60  . Diabetes Father   . Heart attack Father       Review of Systems  Constitutional: Negative for activity change, chills, fatigue and unexpected weight change.  HENT: Negative for congestion, postnasal drip, rhinorrhea, sneezing and sore throat.   Respiratory: Negative for cough, chest tightness, shortness of breath and wheezing.   Cardiovascular: Negative for chest pain and palpitations.  Gastrointestinal: Negative for abdominal pain, constipation, diarrhea, nausea and vomiting.  Endocrine: Negative for cold intolerance,  heat intolerance, polydipsia and polyuria.  Genitourinary: Negative for frequency, hematuria and urgency.  Musculoskeletal: Negative for arthralgias, back pain, joint swelling and neck pain.  Skin: Negative for rash.  Allergic/Immunologic: Negative for environmental allergies.  Neurological: Negative for dizziness, tremors,  numbness and headaches.  Hematological: Negative for adenopathy. Does not bruise/bleed easily.  Psychiatric/Behavioral: Negative for behavioral problems (Depression), sleep disturbance and suicidal ideas. The patient is not nervous/anxious.     Today's Vitals   07/17/20 1038  BP: 132/80  Pulse: 91  Resp: 16  Temp: (!) 97.5 F (36.4 C)  SpO2: 99%  Weight: 169 lb (76.7 kg)  Height: _0  (1.676 m)   Body mass index is 27.28 kg/m.  Physical Exam Vitals and nursing note reviewed.  Constitutional:      General: She is not in acute distress.    Appearance: Normal appearance. She is well-developed. She is not diaphoretic.  HENT:     Head: Normocephalic and atraumatic.     Nose: Nose normal.     Mouth/Throat:     Pharynx: No oropharyngeal exudate.  Eyes:     Pupils: Pupils are equal, round, and reactive to light.  Neck:     Thyroid: No thyromegaly.     Vascular: No carotid bruit or JVD.     Trachea: No tracheal deviation.  Cardiovascular:     Rate and Rhythm: Normal rate and regular rhythm.     Pulses: Normal pulses.     Heart sounds: Normal heart sounds. No murmur heard.  No friction rub. No gallop.   Pulmonary:     Effort: Pulmonary effort is normal. No respiratory distress.     Breath sounds: Normal breath sounds. No wheezing or rales.  Chest:     Chest wall: No tenderness.     Breasts:        Right: Normal. No swelling, bleeding, inverted nipple, mass, nipple discharge, skin change or tenderness.        Left: Normal. No swelling, bleeding, inverted nipple, mass, nipple discharge, skin change or tenderness.  Abdominal:     General: Bowel sounds are normal.     Palpations: Abdomen is soft.     Tenderness: There is no abdominal tenderness.  Musculoskeletal:        General: Normal range of motion.     Cervical back: Normal range of motion and neck supple.  Lymphadenopathy:     Cervical: No cervical adenopathy.     Upper Body:     Right upper body: No axillary  adenopathy.     Left upper body: No axillary adenopathy.  Skin:    General: Skin is warm and dry.     Capillary Refill: Capillary refill takes 2 to 3 seconds.  Neurological:     General: No focal deficit present.     Mental Status: She is alert and oriented to person, place, and time.     Cranial Nerves: No cranial nerve deficit.     Coordination: Coordination normal.  Psychiatric:        Mood and Affect: Mood normal.        Behavior: Behavior normal.        Thought Content: Thought content normal.        Judgment: Judgment normal.      LABS: Recent Results (from the past 2160 hour(s))  UA/M w/rflx Culture, Routine     Status: None   Collection Time: 07/17/20 11:57 AM   Specimen: Urine   Urine  Result Value  Ref Range   Specific Gravity, UA 1.024 1.005 - 1.030   pH, UA 6.5 5.0 - 7.5   Color, UA Yellow Yellow   Appearance Ur Clear Clear   Leukocytes,UA Negative Negative   Protein,UA Negative Negative/Trace   Glucose, UA Negative Negative   Ketones, UA Negative Negative   RBC, UA Negative Negative   Bilirubin, UA Negative Negative   Urobilinogen, Ur 1.0 0.2 - 1.0 mg/dL   Nitrite, UA Negative Negative   Microscopic Examination Comment     Comment: Microscopic follows if indicated.   Microscopic Examination See below:     Comment: Microscopic was indicated and was performed.   Urinalysis Reflex Comment     Comment: This specimen will not reflex to a Urine Culture.  Microscopic Examination     Status: None   Collection Time: 07/17/20 11:57 AM   Urine  Result Value Ref Range   WBC, UA 0-5 0 - 5 /hpf   RBC 0-2 0 - 2 /hpf   Epithelial Cells (non renal) 0-10 0 - 10 /hpf   Casts None seen None seen /lpf   Bacteria, UA Few None seen/Few    Assessment/Plan: 1. Encounter for general adult medical examination with abnormal findings Annual health maintenance exam today. Order slip given to have routine, fasting labs checked.   2. Essential hypertension Stable. Continue bp  medication as prescribed   3. Mild intermittent asthma without complication Continue to use albuterol as needed and as prescribed   4. Screening for colon cancer Order to be sent to [facility] for Cologuard testing  5. Dysuria - UA/M w/rflx Culture, Routine  General Counseling: Markham Jordan understanding of the findings of todays visit and agrees with plan of treatment. I have discussed any further diagnostic evaluation that may be needed or ordered today. We also reviewed her medications today. she has been encouraged to call the office with any questions or concerns that should arise related to todays visit.    Counseling:  This patient was seen by Leretha Pol FNP Collaboration with Dr Lavera Guise as a part of collaborative care agreement  Orders Placed This Encounter  Procedures  . Microscopic Examination  . UA/M w/rflx Culture, Routine      Total time spent: 12 Minutes  Time spent includes review of chart, medications, test results, and follow up plan with the patient.     Lavera Guise, MD  Internal Medicine

## 2020-07-18 LAB — UA/M W/RFLX CULTURE, ROUTINE
Bilirubin, UA: NEGATIVE
Glucose, UA: NEGATIVE
Ketones, UA: NEGATIVE
Leukocytes,UA: NEGATIVE
Nitrite, UA: NEGATIVE
Protein,UA: NEGATIVE
RBC, UA: NEGATIVE
Specific Gravity, UA: 1.024 (ref 1.005–1.030)
Urobilinogen, Ur: 1 mg/dL (ref 0.2–1.0)
pH, UA: 6.5 (ref 5.0–7.5)

## 2020-07-18 LAB — MICROSCOPIC EXAMINATION: Casts: NONE SEEN /lpf

## 2020-07-22 ENCOUNTER — Other Ambulatory Visit: Payer: Self-pay

## 2020-07-22 DIAGNOSIS — I10 Essential (primary) hypertension: Secondary | ICD-10-CM

## 2020-07-22 MED ORDER — DILTIAZEM HCL ER COATED BEADS 120 MG PO CP24: ORAL_CAPSULE | ORAL | 1 refills | Status: DC

## 2020-07-31 ENCOUNTER — Other Ambulatory Visit: Payer: Self-pay | Admitting: Internal Medicine

## 2020-07-31 DIAGNOSIS — R928 Other abnormal and inconclusive findings on diagnostic imaging of breast: Secondary | ICD-10-CM

## 2020-07-31 DIAGNOSIS — Z1231 Encounter for screening mammogram for malignant neoplasm of breast: Secondary | ICD-10-CM

## 2020-07-31 DIAGNOSIS — N6310 Unspecified lump in the right breast, unspecified quadrant: Secondary | ICD-10-CM

## 2020-08-01 ENCOUNTER — Telehealth: Payer: Self-pay

## 2020-08-01 NOTE — Telephone Encounter (Signed)
I see 2 different orders for 1. For bilateral breast, second is follow up on her cyst. They will only do the second one if the first one needs further imaging. Lets discuss before calling pt on Monday

## 2020-08-01 NOTE — Telephone Encounter (Signed)
-----   Message from Lyndon Code, MD sent at 07/31/2020  3:27 PM EDT ----- Bill Salinas can u find out from [place], pt has some questions about her mammogram

## 2020-08-02 ENCOUNTER — Encounter: Payer: Self-pay | Admitting: Nurse Practitioner

## 2020-08-02 DIAGNOSIS — Z0001 Encounter for general adult medical examination with abnormal findings: Secondary | ICD-10-CM | POA: Insufficient documentation

## 2020-08-02 DIAGNOSIS — Z1211 Encounter for screening for malignant neoplasm of colon: Secondary | ICD-10-CM | POA: Insufficient documentation

## 2020-08-04 ENCOUNTER — Encounter: Payer: Self-pay | Admitting: Dermatology

## 2020-08-04 ENCOUNTER — Other Ambulatory Visit: Payer: Self-pay

## 2020-08-04 ENCOUNTER — Ambulatory Visit (INDEPENDENT_AMBULATORY_CARE_PROVIDER_SITE_OTHER): Payer: Commercial Managed Care - PPO | Admitting: Dermatology

## 2020-08-04 DIAGNOSIS — L578 Other skin changes due to chronic exposure to nonionizing radiation: Secondary | ICD-10-CM

## 2020-08-04 DIAGNOSIS — L820 Inflamed seborrheic keratosis: Secondary | ICD-10-CM | POA: Diagnosis not present

## 2020-08-04 DIAGNOSIS — L821 Other seborrheic keratosis: Secondary | ICD-10-CM | POA: Diagnosis not present

## 2020-08-04 NOTE — Patient Instructions (Addendum)

## 2020-08-04 NOTE — Progress Notes (Signed)
   New Patient Visit  Subjective  Pam Khan is a 49 y.o. female who presents for the following: Lesions. Patient has a few spots she would like evaluated. There is one at left jaw/neck that has been there for about 1 year but has gotten larger and irritated. She also has one under left breast present for a few months and one at left axilla that's been there for a long time. They do itch.  She has had a spot previously removed years ago by Dr. Orson Aloe that may have been BCC.  The patient declined mole check, skin cancer screening and total-body skin exam today.  The following portions of the chart were reviewed this encounter and updated as appropriate:  Tobacco  Allergies  Meds  Problems  Med Hx  Surg Hx  Fam Hx     Review of Systems:  No other skin or systemic complaints except as noted in HPI or Assessment and Plan.  Objective  Well appearing patient in no apparent distress; mood and affect are within normal limits.  A focused examination was performed including face, neck, chest. Relevant physical exam findings are noted in the Assessment and Plan.  Objective  Left Neck and Mandible x 4, Left Epigastric x 1 (5): Erythematous keratotic or waxy stuck-on papule or plaque.    Assessment & Plan  Inflamed seborrheic keratosis (5) Left Neck and Mandible x 4, Left Epigastric x 1  Destruction of lesion - Left Neck and Mandible x 4, Left Epigastric x 1 Complexity: simple   Destruction method: cryotherapy   Informed consent: discussed and consent obtained   Timeout:  patient name, date of birth, surgical site, and procedure verified Lesion destroyed using liquid nitrogen: Yes   Region frozen until ice ball extended beyond lesion: Yes   Outcome: patient tolerated procedure well with no complications   Post-procedure details: wound care instructions given     Seborrheic Keratoses - Stuck-on, waxy, tan-brown papules and plaques  - Discussed benign etiology and prognosis. -  Observe - Call for any changes  Actinic Damage - diffuse scaly erythematous macules with underlying dyspigmentation - Recommend daily broad spectrum sunscreen SPF 30+ to sun-exposed areas, reapply every 2 hours as needed.  - Call for new or changing lesions.  Return if symptoms worsen or fail to improve.  Anise Salvo, RMA, am acting as scribe for Armida Sans, MD . Documentation: I have reviewed the above documentation for accuracy and completeness, and I agree with the above.  Armida Sans, MD

## 2020-08-04 NOTE — Telephone Encounter (Signed)
The order is correct, she should be schd for bilat diagnostic mammo and follow up ultrasound right breast, all that  is recommended from her last mammo if you read the impression from her last mammo and ultrasound. beth

## 2020-09-12 LAB — COLOGUARD: COLOGUARD: NEGATIVE

## 2020-09-24 ENCOUNTER — Ambulatory Visit: Payer: Commercial Managed Care - PPO | Admitting: Dermatology

## 2020-09-24 ENCOUNTER — Other Ambulatory Visit: Payer: Self-pay

## 2020-09-24 DIAGNOSIS — L820 Inflamed seborrheic keratosis: Secondary | ICD-10-CM

## 2020-09-24 DIAGNOSIS — L821 Other seborrheic keratosis: Secondary | ICD-10-CM

## 2020-09-24 DIAGNOSIS — L578 Other skin changes due to chronic exposure to nonionizing radiation: Secondary | ICD-10-CM | POA: Diagnosis not present

## 2020-09-24 NOTE — Progress Notes (Signed)
   Follow-Up Visit   Subjective  Pam Khan is a 49 y.o. female who presents for the following: Seborrheic Keratosis (Pt here to have irritated itchy spots removed from her face chest and legs ).  The following portions of the chart were reviewed this encounter and updated as appropriate:  Tobacco  Allergies  Meds  Problems  Med Hx  Surg Hx  Fam Hx     Review of Systems:  No other skin or systemic complaints except as noted in HPI or Assessment and Plan.  Objective  Well appearing patient in no apparent distress; mood and affect are within normal limits.  A focused examination was performed including face,neck,chest,arms . Relevant physical exam findings are noted in the Assessment and Plan.  Objective  face,chest,arms, L foot (14): Erythematous keratotic or waxy stuck-on papule or plaque.    Assessment & Plan  Inflamed seborrheic keratosis (14) face,chest,arms, L foot  Destruction of lesion - face,chest,arms, L foot Complexity: simple   Destruction method: cryotherapy   Informed consent: discussed and consent obtained   Timeout:  patient name, date of birth, surgical site, and procedure verified Lesion destroyed using liquid nitrogen: Yes   Region frozen until ice ball extended beyond lesion: Yes   Outcome: patient tolerated procedure well with no complications   Post-procedure details: wound care instructions given    Actinic Damage - chronic, secondary to cumulative UV radiation exposure/sun exposure over time - diffuse scaly erythematous macules with underlying dyspigmentation - Recommend daily broad spectrum sunscreen SPF 30+ to sun-exposed areas, reapply every 2 hours as needed.  - Call for new or changing lesions.  Seborrheic Keratoses - Stuck-on, waxy, tan-brown papules and plaques  - Discussed benign etiology and prognosis. - Observe - Call for any changes  Return if symptoms worsen or fail to improve.  IAngelique Holm, CMA, am acting as scribe  for Armida Sans, MD .  Documentation: I have reviewed the above documentation for accuracy and completeness, and I agree with the above.  Armida Sans, MD

## 2020-09-24 NOTE — Patient Instructions (Signed)
Cryotherapy Aftercare  . Wash gently with soap and water everyday.   . Apply Vaseline and Band-Aid daily until healed.  

## 2020-09-26 ENCOUNTER — Encounter: Payer: Self-pay | Admitting: Dermatology

## 2020-10-16 ENCOUNTER — Other Ambulatory Visit: Payer: Self-pay

## 2020-10-16 DIAGNOSIS — I10 Essential (primary) hypertension: Secondary | ICD-10-CM

## 2020-10-16 MED ORDER — TRIAMTERENE-HCTZ 37.5-25 MG PO TABS: 1.0000 | ORAL_TABLET | Freq: Every day | ORAL | 3 refills | Status: DC

## 2020-11-14 ENCOUNTER — Telehealth: Payer: Self-pay

## 2020-11-17 ENCOUNTER — Ambulatory Visit: Payer: Commercial Managed Care - PPO | Admitting: Internal Medicine

## 2020-11-17 ENCOUNTER — Other Ambulatory Visit: Payer: Self-pay

## 2020-11-17 DIAGNOSIS — I10 Essential (primary) hypertension: Secondary | ICD-10-CM

## 2020-11-17 MED ORDER — DILTIAZEM HCL ER COATED BEADS 120 MG PO CP24: ORAL_CAPSULE | ORAL | 1 refills | Status: DC

## 2020-12-05 LAB — COLOGUARD

## 2020-12-08 ENCOUNTER — Ambulatory Visit: Payer: Commercial Managed Care - PPO | Admitting: Physician Assistant

## 2020-12-08 ENCOUNTER — Encounter: Payer: Self-pay | Admitting: Physician Assistant

## 2020-12-08 DIAGNOSIS — Z0189 Encounter for other specified special examinations: Secondary | ICD-10-CM

## 2020-12-08 DIAGNOSIS — Z1211 Encounter for screening for malignant neoplasm of colon: Secondary | ICD-10-CM | POA: Diagnosis not present

## 2020-12-08 DIAGNOSIS — E2839 Other primary ovarian failure: Secondary | ICD-10-CM

## 2020-12-08 DIAGNOSIS — K219 Gastro-esophageal reflux disease without esophagitis: Secondary | ICD-10-CM

## 2020-12-08 DIAGNOSIS — Z1231 Encounter for screening mammogram for malignant neoplasm of breast: Secondary | ICD-10-CM

## 2020-12-08 DIAGNOSIS — I10 Essential (primary) hypertension: Secondary | ICD-10-CM | POA: Diagnosis not present

## 2020-12-08 DIAGNOSIS — N6310 Unspecified lump in the right breast, unspecified quadrant: Secondary | ICD-10-CM

## 2020-12-08 DIAGNOSIS — J4520 Mild intermittent asthma, uncomplicated: Secondary | ICD-10-CM

## 2020-12-08 DIAGNOSIS — R928 Other abnormal and inconclusive findings on diagnostic imaging of breast: Secondary | ICD-10-CM

## 2020-12-08 NOTE — Progress Notes (Signed)
[facility] [place], [place] [address]  Internal MEDICINE  Office Visit Note  Patient Name: Pam Khan  [id number]  [id number]  Date of Service: 12/15/2020  Chief Complaint  Patient presents with  . Hypertension    4 month f-up  . Quality Metric Gaps    Covid vacc, mammogram, flu vacc    HPI  Pt reports today for 4 month f/u. She is asking about her Cologuard results which was negative. She will need to repeat in 3 years. She also states her insurance stopped paying for 48mo f/u on mammogram, which she was doing due to significant FHX, so she missed an appt due to this. When she tried to go back for mammogram she was told she she couldn't get another after missing previous appt and she needs one. Discussed we will look into this. Her asthma is well controlled. She was sick a few weeks ago and was covid negative, but she is much improved now. She used her albuterol during that time and it helped. She has been vaccinated and boosted against covid. She has not been taking birth control for the last 6 months because she is not currently sexually active and thinks she may have gone through menopause. She thinks her menstrual cycles stopped almost 10 years ago, but is unsure with being on the birth control. She has had a few hot flashes, but they have been very inconsistent. She would like to find out if she is post menopausal or not. Additionally, she reports she does not always take the Triamterene-HCTZ  because she gets cramps in her neck and back, but she does get fluid in her legs and knows she needs to take it. She has barretts esophagus and won't take omeprazole too often, but will take it occasionally to help with reflux causing further irritation. She will use xanax once in a blue moon, but has used it more lately due to stress of finding a new home. She is in the process of leaving her BF and is looking for a new place to live.    Current Medication: Outpatient  Encounter Medications as of 12/08/2020  Medication Sig  . albuterol (VENTOLIN HFA) 108 (90 Base) MCG/ACT inhaler Inhale 2 puffs into the lungs every 6 (six) hours as needed for wheezing or shortness of breath.  . diltiazem (CARDIZEM CD) 120 MG 24 hr capsule Take 1 capsule by mouth at bedtime for heart rate  . ergocalciferol (DRISDOL) 1.25 MG (50000 UT) capsule Take 1 capsule (50,000 Units total) by mouth once a week.  . famciclovir (FAMVIR) 500 MG tablet Take 1 tablet (500 mg total) by mouth 2 (two) times daily.  . norethindrone (MICRONOR) 0.35 MG tablet Take 1 tablet (0.35 mg total) by mouth daily.  Marland Kitchen omeprazole (PRILOSEC) 40 MG capsule TAKE ONE CAPSULE BY MOUTH EVERY DAY FOR HEART BURN  . triamterene-hydrochlorothiazide (MAXZIDE-25) 37.5-25 MG tablet Take 1 tablet by mouth daily.  . [DISCONTINUED] diptheria-tetanus toxoids (TDVAX) 2-2 LF/0.5ML injection TDVAX 2 Lf unit-2 Lf unit/0.5 mL intramuscular suspension (Patient not taking: Reported on 12/08/2020)  . [DISCONTINUED] Zoster Vaccine Adjuvanted [facility]) injection Shingrix (PF) 50 mcg/0.5 mL intramuscular suspension, kit (Patient not taking: Reported on 12/08/2020)   No facility-administered encounter medications on file as of 12/08/2020.    Surgical History: Past Surgical History:  Procedure Laterality Date  . BACK SURGERY  1985  . CESAREAN SECTION  1989  . COSMETIC SURGERY  1985   head  . CYSTOSCOPY  WITH STENT PLACEMENT Left 05/17/2019   Procedure: CYSTOSCOPY WITH STENT PLACEMENT;  Surgeon: Hollice Espy, MD;  Location: [facility] ORS;  Service: Urology;  Laterality: Left;  . CYSTOSCOPY/URETEROSCOPY/HOLMIUM LASER/STENT PLACEMENT Left 05/25/2019   Procedure: CYSTOSCOPY/URETEROSCOPY/STENT Exchange;  Surgeon: Hollice Espy, MD;  Location: [facility] ORS;  Service: Urology;  Laterality: Left;  . DILATION AND CURETTAGE OF UTERUS  2007    Medical History: Past Medical History:  Diagnosis Date  . Anxiety   . Asthma   . Barrett esophagus   . DDD  (degenerative disc disease), lumbar   . Family history of adverse reaction to anesthesia    FATHER ALWAYS TROUBLE WAKING UP. WITH NECK SURGERY 2008/ASPIRATED EVENTUALLY DIED PNEUMONIA ETC  . GERD (gastroesophageal reflux disease)   . Hypertension   . Spinal stenosis of lumbar region     Family History: Family History  Problem Relation Age of Onset  . Breast cancer Mother 39  . Breast cancer Maternal Grandmother 60  . Diabetes Father   . Heart attack Father     Social History   Socioeconomic History  . Marital status: Married    Spouse name: Not on file  . Number of children: Not on file  . Years of education: Not on file  . Highest education level: Not on file  Occupational History  . Not on file  Tobacco Use  . Smoking status: Never Smoker  . Smokeless tobacco: Never Used  Vaping Use  . Vaping Use: Never used  Substance and Sexual Activity  . Alcohol use: Yes    Comment: occasionally  . Drug use: No  . Sexual activity: Not on file  Other Topics Concern  . Not on file  Social History Narrative  . Not on file   Social Determinants of Health   Financial Resource Strain: Not on file  Food Insecurity: Not on file  Transportation Needs: Not on file  Physical Activity: Not on file  Stress: Not on file  Social Connections: Not on file  Intimate Partner Violence: Not on file      Review of Systems  Constitutional: Negative for chills, fatigue and unexpected weight change.  HENT: Negative for congestion, postnasal drip, rhinorrhea, sneezing and sore throat.   Eyes: Negative for redness.  Respiratory: Negative for cough, chest tightness and shortness of breath.   Cardiovascular: Negative for chest pain and palpitations.  Gastrointestinal: Negative for abdominal pain, constipation, diarrhea, nausea and vomiting.  Genitourinary: Negative for dysuria and frequency.  Musculoskeletal: Negative for arthralgias, back pain, joint swelling and neck pain.  Skin: Negative  for rash.  Neurological: Negative.  Negative for tremors and numbness.  Hematological: Negative for adenopathy. Does not bruise/bleed easily.  Psychiatric/Behavioral: Negative for behavioral problems (Depression), sleep disturbance and suicidal ideas. The patient is nervous/anxious.     Vital Signs: BP 132/82   Pulse 89   Temp 98.1 F (36.7 C)   Resp 16   Ht $R'5\' 6"'MB$  (1.676 m)   Wt 177 lb 6.4 oz (80.5 kg)   SpO2 98%   BMI 28.63 kg/m    Physical Exam Constitutional:      General: She is not in acute distress.    Appearance: She is well-developed. She is not diaphoretic.  HENT:     Head: Normocephalic and atraumatic.     Mouth/Throat:     Pharynx: No oropharyngeal exudate.  Eyes:     Pupils: Pupils are equal, round, and reactive to light.  Neck:     Thyroid: No thyromegaly.  Vascular: No JVD.     Trachea: No tracheal deviation.  Cardiovascular:     Rate and Rhythm: Normal rate and regular rhythm.     Heart sounds: Normal heart sounds. No murmur heard. No friction rub. No gallop.   Pulmonary:     Effort: Pulmonary effort is normal. No respiratory distress.     Breath sounds: No wheezing or rales.  Chest:     Chest wall: No tenderness.  Abdominal:     General: Bowel sounds are normal.     Palpations: Abdomen is soft.  Musculoskeletal:        General: Normal range of motion.     Cervical back: Normal range of motion and neck supple.  Lymphadenopathy:     Cervical: No cervical adenopathy.  Skin:    General: Skin is warm and dry.  Neurological:     Mental Status: She is alert and oriented to person, place, and time.     Cranial Nerves: No cranial nerve deficit.  Psychiatric:        Behavior: Behavior normal.        Thought Content: Thought content normal.        Judgment: Judgment normal.        Assessment/Plan: 1. Encounter for laboratory test Ordered age appropriate labs. - CBC with Differential/Platelet; Future - Lipid Panel With LDL/HDL Ratio;  Future - TSH; Future - T4, free; Future - Comprehensive metabolic panel - Vitamin D (25 hydroxy) - B12 and Folate Panel  2. Gastroesophageal reflux disease without esophagitis Continue Omeprazole as needed.  3. Essential hypertension Stable. Continue Triamterene-HCTZ.  4. Mild intermittent asthma without complication Stable. Continue albuterol inhaler as needed.  5. Primary ovarian failure Possible menopausal symptoms since being off birth control. Will check hormone levels. - FSH/LH  6. Screening for colon cancer Reviewed Cologuard results which were negative. Repeat in 3 years.  General Counseling: Markham Jordan understanding of the findings of todays visit and agrees with plan of treatment. I have discussed any further diagnostic evaluation that may be needed or ordered today. We also reviewed her medications today. she has been encouraged to call the office with any questions or concerns that should arise related to todays visit.    Orders Placed This Encounter  Procedures  . CBC with Differential/Platelet  . Lipid Panel With LDL/HDL Ratio  . TSH  . T4, free  . Comprehensive metabolic panel  . Vitamin D (25 hydroxy)  . B12 and Folate Panel  . FSH/LH    No orders of the defined types were placed in this encounter.   Total time spent: 40 Minutes Time spent includes review of chart, medications, test results, and follow up plan with the patient.      Dr Lavera Guise Internal medicine

## 2020-12-19 LAB — COMPREHENSIVE METABOLIC PANEL
ALT: 72 IU/L — ABNORMAL HIGH (ref 0–32)
AST: 49 IU/L — ABNORMAL HIGH (ref 0–40)
Albumin/Globulin Ratio: 1.8 (ref 1.2–2.2)
Albumin: 4.9 g/dL (ref 3.8–4.9)
Alkaline Phosphatase: 117 IU/L (ref 44–121)
BUN/Creatinine Ratio: 14 (ref 9–23)
BUN: 14 mg/dL (ref 6–24)
Bilirubin Total: 0.7 mg/dL (ref 0.0–1.2)
CO2: 21 mmol/L (ref 20–29)
Calcium: 10.4 mg/dL — ABNORMAL HIGH (ref 8.7–10.2)
Chloride: 101 mmol/L (ref 96–106)
Creatinine, Ser: 0.99 mg/dL (ref 0.57–1.00)
GFR calc Af Amer: 76 mL/min/{1.73_m2} (ref >59)
GFR calc non Af Amer: 66 mL/min/{1.73_m2} (ref >59)
Globulin, Total: 2.7 g/dL (ref 1.5–4.5)
Glucose: 112 mg/dL — ABNORMAL HIGH (ref 65–99)
Potassium: 4.7 mmol/L (ref 3.5–5.2)
Sodium: 140 mmol/L (ref 134–144)
Total Protein: 7.6 g/dL (ref 6.0–8.5)

## 2020-12-19 LAB — B12 AND FOLATE PANEL
Folate: >20 ng/mL (ref >3.0)
Vitamin B-12: 787 pg/mL (ref 232–1245)

## 2020-12-19 LAB — VITAMIN D 25 HYDROXY (VIT D DEFICIENCY, FRACTURES): Vit D, 25-Hydroxy: 22.2 ng/mL — ABNORMAL LOW (ref 30.0–100.0)

## 2020-12-19 LAB — FSH/LH
FSH: 87.7 m[IU]/mL
LH: 45.5 m[IU]/mL

## 2020-12-24 ENCOUNTER — Telehealth: Payer: Self-pay

## 2020-12-25 NOTE — Telephone Encounter (Signed)
Pt notified about labs and advised her to stopped nyquil

## 2020-12-25 NOTE — Telephone Encounter (Signed)
I tried calling her but she did not answer and VM full. If she calls again please let her know that her labs indicate she is postmenopausal and she has low Vitamin D for which she should do OTC supplement of 1000IU daily.  Also her calcium is high, so ask if she takes calcium supplement and to stop if so.  Please also ask if she is drinking alcohol and taking tylenol as these can raise LFTs. If not, we will get more labs in 4 weeks and reach back out with results.

## 2020-12-25 NOTE — Addendum Note (Signed)
Addended by: Lynn Ito on: 12/25/2020 08:38 AM   Modules accepted: Orders

## 2020-12-26 ENCOUNTER — Other Ambulatory Visit: Payer: Self-pay

## 2020-12-26 ENCOUNTER — Ambulatory Visit
Admission: RE | Admit: 2020-12-26 | Discharge: 2020-12-26 | Disposition: A | Discharge status: Home / Self Care | Payer: Commercial Managed Care - PPO | Source: Ambulatory Visit | Attending: Internal Medicine | Admitting: Internal Medicine

## 2020-12-26 DIAGNOSIS — Z1231 Encounter for screening mammogram for malignant neoplasm of breast: Secondary | ICD-10-CM | POA: Diagnosis present

## 2020-12-26 DIAGNOSIS — R928 Other abnormal and inconclusive findings on diagnostic imaging of breast: Secondary | ICD-10-CM | POA: Insufficient documentation

## 2020-12-26 DIAGNOSIS — N6310 Unspecified lump in the right breast, unspecified quadrant: Secondary | ICD-10-CM | POA: Diagnosis present

## 2020-12-26 LAB — LIPID PANEL WITH LDL/HDL RATIO
Cholesterol, Total: 213 mg/dL — ABNORMAL HIGH (ref 100–199)
HDL: 36 mg/dL — ABNORMAL LOW (ref >39)
LDL Chol Calc (NIH): 131 mg/dL — ABNORMAL HIGH (ref 0–99)
LDL/HDL Ratio: 3.6 ratio — ABNORMAL HIGH (ref 0.0–3.2)
Triglycerides: 258 mg/dL — ABNORMAL HIGH (ref 0–149)
VLDL Cholesterol Cal: 46 mg/dL — ABNORMAL HIGH (ref 5–40)

## 2020-12-26 LAB — T4, FREE: Free T4: 1.17 ng/dL (ref 0.82–1.77)

## 2020-12-26 LAB — TSH: TSH: 1.36 u[IU]/mL (ref 0.450–4.500)

## 2020-12-26 LAB — SPECIMEN STATUS REPORT

## 2020-12-26 IMAGING — US US BREAST*R* LIMITED INC AXILLA
1 series · 7 of 7 positions shown · non-contrast
Comparison: Previous exam(s).

CLINICAL DATA: 50-year-old female presenting for annual exam as
well as follow-up of a probably benign right breast mass.

EXAM:
DIGITAL DIAGNOSTIC BILATERAL MAMMOGRAM WITH TOMOSYNTHESIS AND CAD;
ULTRASOUND RIGHT BREAST LIMITED
TECHNIQUE: Bilateral digital diagnostic mammography and breast tomosynthesis
was performed. The images were evaluated with computer-aided
detection.; Targeted ultrasound examination of the right breast was
performed

[Series 1: us breast*right* limited inc axilla · 0.06mm/px · 7 of 7 slices shown]
[im 1/7]
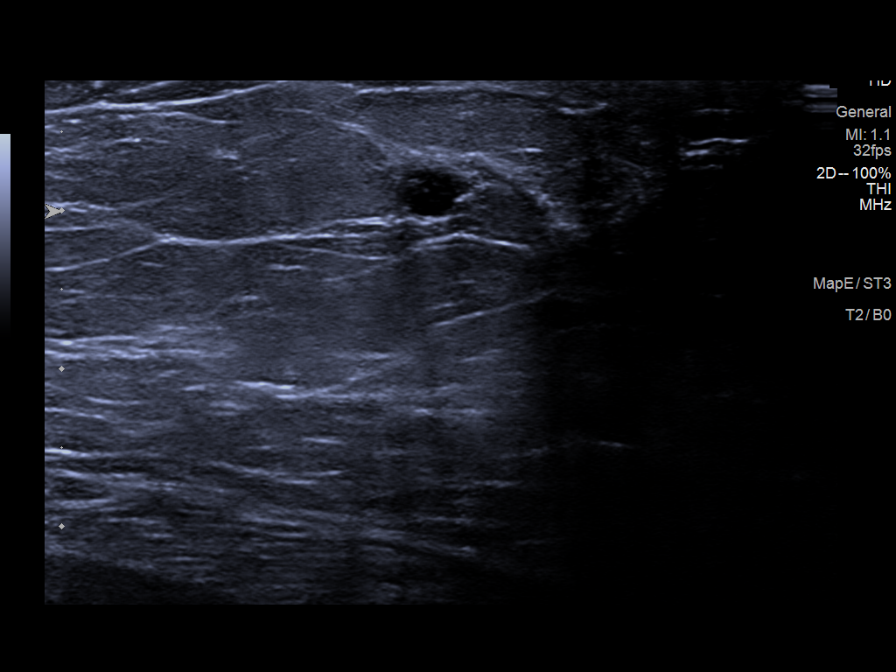
[im 2/7]
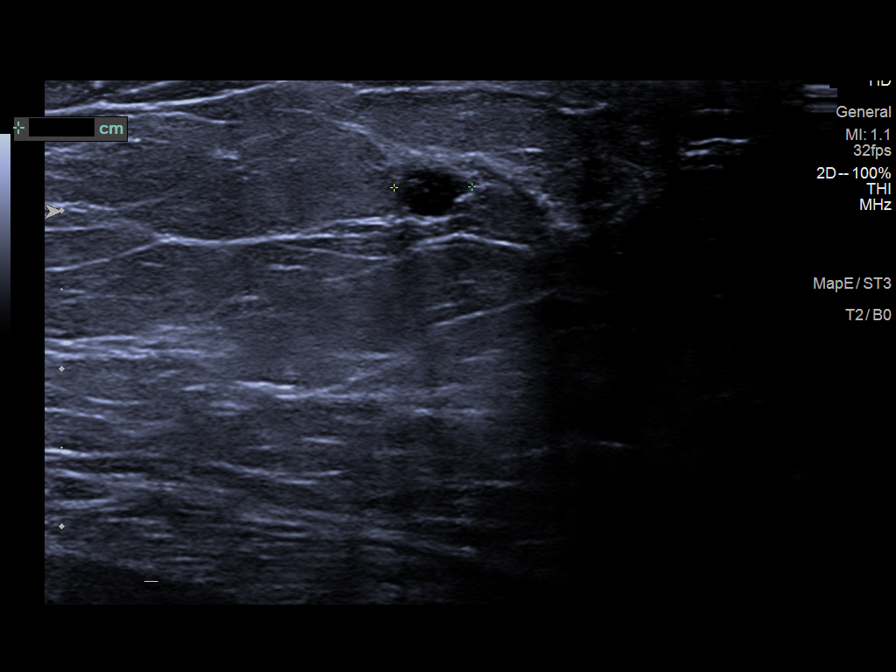
[im 3/7]
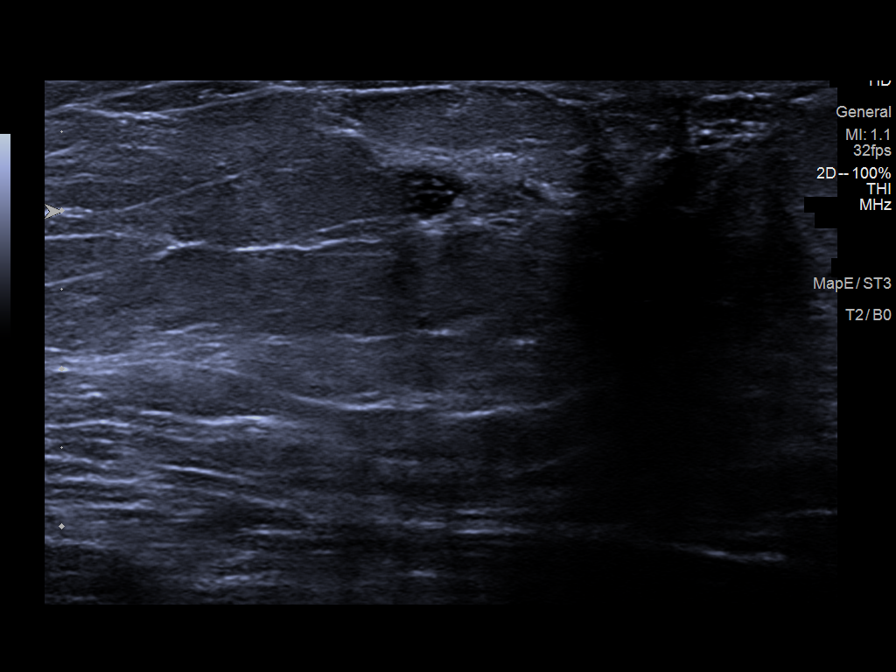
[im 4/7]
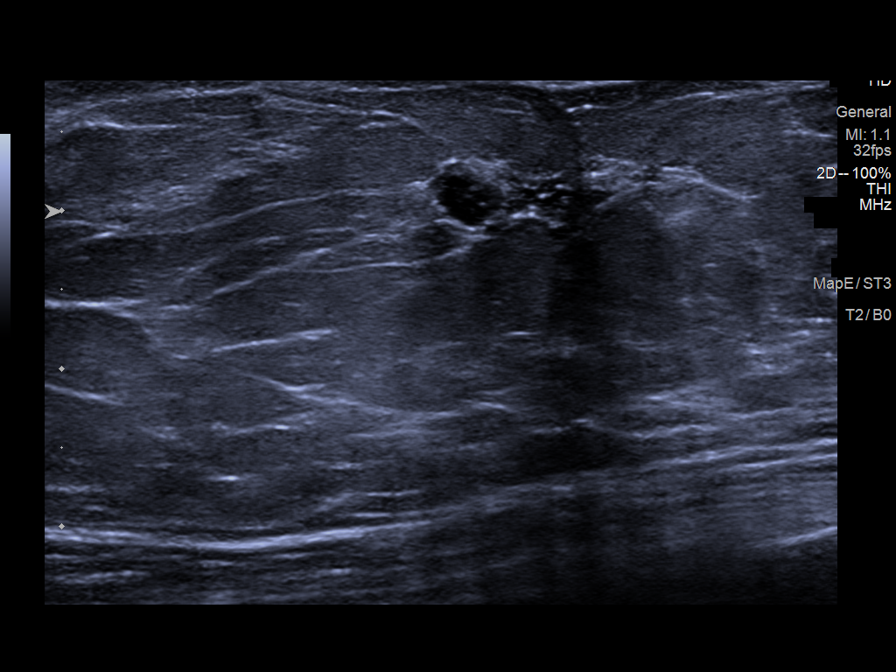
[im 5/7]
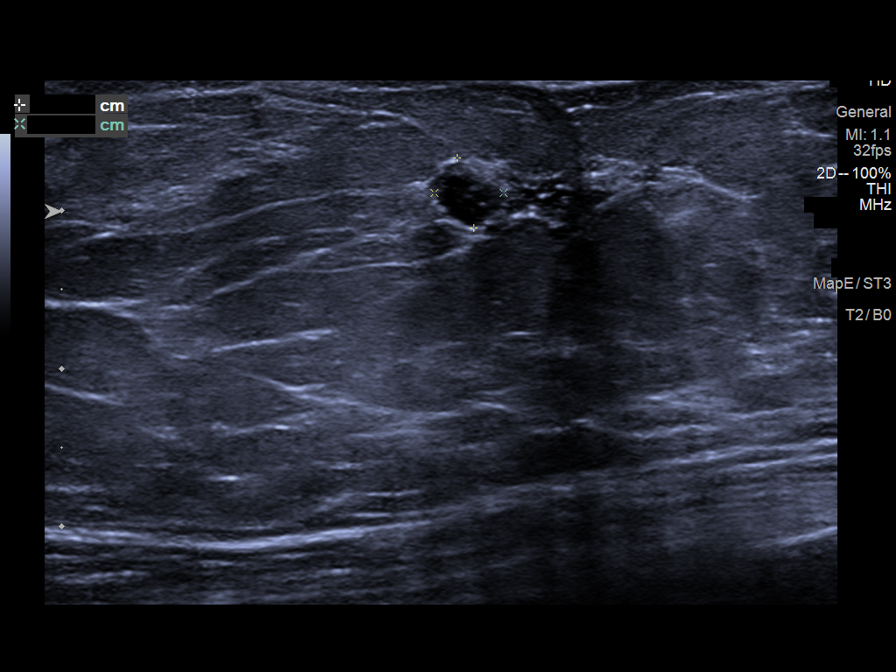
[im 6/7]
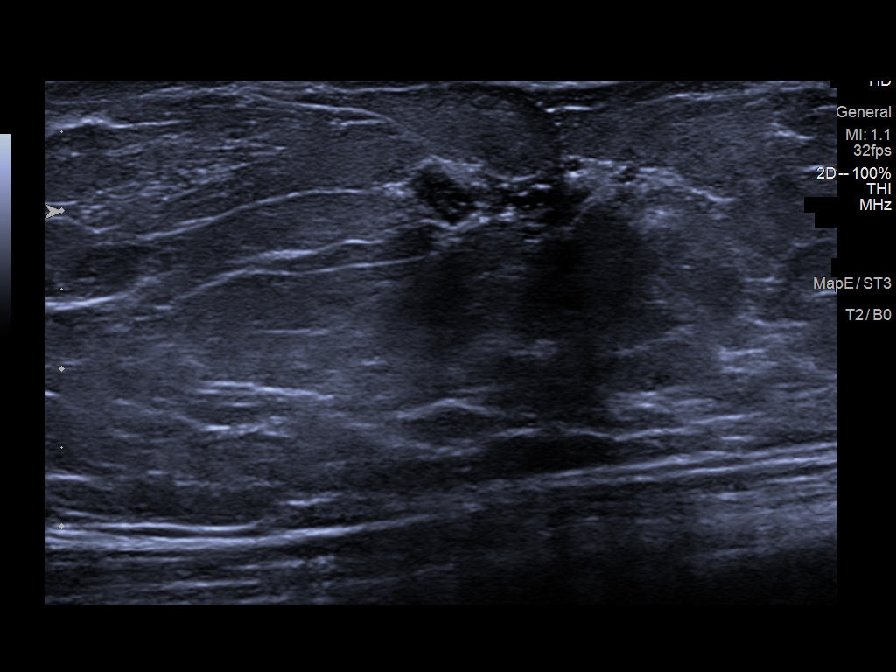
[im 7/7]
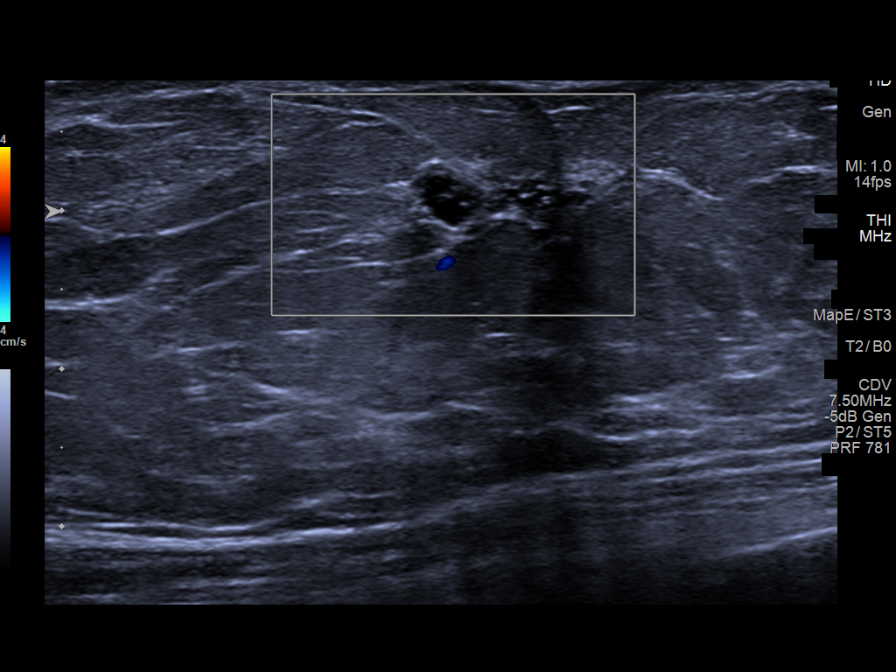

[7 of 7 positions shown; findings below may reference images not displayed]

ACR Breast Density Category b: There are scattered areas of
fibroglandular density.
FINDINGS: Mammogram:

Right breast: There is a stable small oval circumscribed mass in the
outer right breast anterior depth. No new suspicious mass,
distortion, or microcalcifications are identified to suggest
presence of malignancy.

Left breast: No suspicious mass, distortion, or microcalcifications
are identified to suggest presence of malignancy.

Ultrasound:

Targeted ultrasound is performed in the right breast at 9 o'clock 1
cm from the nipple demonstrating an oval circumscribed predominantly
anechoic mass with a few internal echoes overall measuring 0.5 x
x 0.5 cm, previously measuring 0.5 x 0.5 x 0.5 cm. No internal
vascularity.
IMPRESSION: 1. Stable probably benign mass in the right breast at 9 o'clock,
likely a complicated cyst.

2.  No mammographic evidence of malignancy in the left breast.

RECOMMENDATION:
Diagnostic bilateral mammogram and right breast ultrasound in 1
year.

I have discussed the findings and recommendations with the patient.
If applicable, a reminder letter will be sent to the patient
regarding the next appointment.

BI-RADS CATEGORY  3: Probably benign.

## 2020-12-31 ENCOUNTER — Other Ambulatory Visit: Payer: Self-pay | Admitting: Physician Assistant

## 2020-12-31 DIAGNOSIS — R899 Unspecified abnormal finding in specimens from other organs, systems and tissues: Secondary | ICD-10-CM

## 2020-12-31 DIAGNOSIS — R5383 Other fatigue: Secondary | ICD-10-CM

## 2021-01-16 NOTE — Progress Notes (Signed)
Reviewed

## 2021-03-24 NOTE — Telephone Encounter (Signed)
Done

## 2021-04-12 ENCOUNTER — Other Ambulatory Visit: Payer: Self-pay | Admitting: Nurse Practitioner

## 2021-04-12 DIAGNOSIS — I10 Essential (primary) hypertension: Secondary | ICD-10-CM

## 2021-05-12 DIAGNOSIS — M545 Low back pain, unspecified: Secondary | ICD-10-CM | POA: Insufficient documentation

## 2021-05-22 LAB — HEPATIC FUNCTION PANEL
ALT: 64 IU/L — ABNORMAL HIGH (ref 0–32)
AST: 34 IU/L (ref 0–40)
Albumin: 4.8 g/dL (ref 3.8–4.9)
Alkaline Phosphatase: 116 IU/L (ref 44–121)
Bilirubin Total: 1.3 mg/dL — ABNORMAL HIGH (ref 0.0–1.2)
Bilirubin, Direct: 0.33 mg/dL (ref 0.00–0.40)
Total Protein: 7.4 g/dL (ref 6.0–8.5)

## 2021-05-22 LAB — IRON,TIBC AND FERRITIN PANEL
Ferritin: 175 ng/mL — ABNORMAL HIGH (ref 15–150)
Iron Saturation: 33 % (ref 15–55)
Iron: 116 ug/dL (ref 27–159)
Total Iron Binding Capacity: 350 ug/dL (ref 250–450)
UIBC: 234 ug/dL (ref 131–425)

## 2021-05-22 LAB — PTH, INTACT AND CALCIUM
Calcium: 10.1 mg/dL (ref 8.7–10.2)
PTH: 52 pg/mL (ref 15–65)

## 2021-05-22 LAB — PHOSPHORUS: Phosphorus: 3.6 mg/dL (ref 3.0–4.3)

## 2021-05-22 LAB — CALCIUM, IONIZED: Calcium, Ion: 5.2 mg/dL (ref 4.5–5.6)

## 2021-05-28 ENCOUNTER — Other Ambulatory Visit: Payer: Self-pay | Admitting: Nurse Practitioner

## 2021-05-28 DIAGNOSIS — I10 Essential (primary) hypertension: Secondary | ICD-10-CM

## 2021-06-11 ENCOUNTER — Other Ambulatory Visit: Payer: Self-pay

## 2021-06-11 ENCOUNTER — Encounter: Payer: Self-pay | Admitting: Physician Assistant

## 2021-06-11 ENCOUNTER — Ambulatory Visit (INDEPENDENT_AMBULATORY_CARE_PROVIDER_SITE_OTHER): Payer: Commercial Managed Care - PPO | Admitting: Physician Assistant

## 2021-06-11 DIAGNOSIS — F331 Major depressive disorder, recurrent, moderate: Secondary | ICD-10-CM

## 2021-06-11 DIAGNOSIS — F411 Generalized anxiety disorder: Secondary | ICD-10-CM | POA: Diagnosis not present

## 2021-06-11 DIAGNOSIS — R7301 Impaired fasting glucose: Secondary | ICD-10-CM

## 2021-06-11 DIAGNOSIS — J4520 Mild intermittent asthma, uncomplicated: Secondary | ICD-10-CM

## 2021-06-11 DIAGNOSIS — B001 Herpesviral vesicular dermatitis: Secondary | ICD-10-CM

## 2021-06-11 DIAGNOSIS — R7989 Other specified abnormal findings of blood chemistry: Secondary | ICD-10-CM

## 2021-06-11 DIAGNOSIS — I10 Essential (primary) hypertension: Secondary | ICD-10-CM | POA: Diagnosis not present

## 2021-06-11 DIAGNOSIS — K219 Gastro-esophageal reflux disease without esophagitis: Secondary | ICD-10-CM

## 2021-06-11 LAB — POCT GLYCOSYLATED HEMOGLOBIN (HGB A1C): Hemoglobin A1C: 5.6 % (ref 4.0–5.6)

## 2021-06-11 MED ORDER — ESCITALOPRAM OXALATE 5 MG PO TABS: 5.0000 mg | ORAL_TABLET | Freq: Every day | ORAL | 2 refills | Status: DC

## 2021-06-11 MED ORDER — DILTIAZEM HCL ER COATED BEADS 120 MG PO CP24: ORAL_CAPSULE | ORAL | 1 refills | Status: DC

## 2021-06-11 MED ORDER — OMEPRAZOLE 40 MG PO CPDR: DELAYED_RELEASE_CAPSULE | ORAL | 3 refills | Status: DC

## 2021-06-11 MED ORDER — FLUCONAZOLE 150 MG PO TABS: 150.0000 mg | ORAL_TABLET | Freq: Once | ORAL | 0 refills | Status: AC

## 2021-06-11 MED ORDER — TRIAMTERENE-HCTZ 37.5-25 MG PO TABS: 1.0000 | ORAL_TABLET | Freq: Every day | ORAL | 3 refills | Status: DC

## 2021-06-11 MED ORDER — FAMCICLOVIR 500 MG PO TABS: 500.0000 mg | ORAL_TABLET | Freq: Two times a day (BID) | ORAL | 3 refills | Status: DC

## 2021-06-11 NOTE — Progress Notes (Signed)
[facility] [address] [place], [place] [address]  Internal MEDICINE  Office Visit Note  Patient Name: Pam Khan  [id number]  [id number]  Date of Service: 06/14/2021  Chief Complaint  Patient presents with   Follow-up    Discuss blood work, depression, discuss meds   Gastroesophageal Reflux   Hypertension   Anxiety   Asthma    HPI Pt is here for routine follow up -Tried avoiding tylenol and alcohol prior to new lab draw. 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 Was checked out by [facility] due to sciatica acting up and was put prednisone taper and tramadol -She has been experiencing increased anxiety and stress. She is interested in starting medication to help with this now -She is due for refills today  Current Medication: Outpatient Encounter Medications as of 06/11/2021  Medication Sig   albuterol (VENTOLIN HFA) 108 (90 Base) MCG/ACT inhaler Inhale 2 puffs into the lungs every 6 (six) hours as needed for wheezing or shortness of breath.   escitalopram (LEXAPRO) 5 MG tablet Take 1 tablet (5 mg total) by mouth daily.   [EXPIRED] fluconazole (DIFLUCAN) 150 MG tablet Take 1 tablet (150 mg total) by mouth once for 1 dose.   [DISCONTINUED] diltiazem (CARDIZEM CD) 120 MG 24 hr capsule Take 1 capsule by mouth at bedtime for heart rate   [DISCONTINUED] famciclovir (FAMVIR) 500 MG tablet Take 1 tablet (500 mg total) by mouth 2 (two) times daily.   [DISCONTINUED] omeprazole (PRILOSEC) 40 MG capsule TAKE ONE CAPSULE BY MOUTH EVERY DAY FOR HEART BURN   [DISCONTINUED] triamterene-hydrochlorothiazide (MAXZIDE-25) 37.5-25 MG tablet Take 1 tablet by mouth daily.   diltiazem (CARDIZEM CD) 120 MG 24 hr capsule Take 1 capsule by mouth at bedtime for heart rate   famciclovir (FAMVIR) 500 MG tablet Take 1 tablet (500 mg total) by mouth 2 (two) times daily.    omeprazole (PRILOSEC) 40 MG capsule TAKE ONE CAPSULE BY MOUTH EVERY DAY FOR HEART BURN   triamterene-hydrochlorothiazide (MAXZIDE-25) 37.5-25 MG tablet Take 1 tablet by mouth daily.   [DISCONTINUED] ergocalciferol (DRISDOL) 1.25 MG (50000 UT) capsule Take 1 capsule (50,000 Units total) by mouth once a week. (Patient not taking: Reported on 06/11/2021)   [DISCONTINUED] norethindrone (MICRONOR) 0.35 MG tablet Take 1 tablet (0.35 mg total) by mouth daily. (Patient not taking: Reported on 06/11/2021)   No facility-administered encounter medications on file as of 06/11/2021.    Surgical History: Past Surgical History:  Procedure Laterality Date   BACK SURGERY  1985   CESAREAN SECTION  1989   COSMETIC SURGERY  1985   head   CYSTOSCOPY WITH STENT PLACEMENT Left 05/17/2019   Procedure: CYSTOSCOPY WITH STENT PLACEMENT;  Surgeon: Vanna Scotland, MD;  Location: [facility] OR[facility];  Service: Urology;  Laterality: Left;   CYSTOSCOPY/URETEROSCOPY/HOLMIUM LASER/STENT PLACEMENT Left 05/25/2019   Procedure: CYSTOSCOPY/URETEROSCOPY/STENT Exchange;  Surgeon: Vanna Scotland, MD;  Location: [facility];  Service: Urology;  Laterality: Left;   DILATION AND CURETTAGE OF UTERUS  2007    Medical History: Past Medical History:  Diagnosis Date   Anxiety    Asthma    Barrett esophagus    DDD (degenerative disc disease), lumbar    Family history of adverse reaction to anesthesia    FATHER ALWAYS TROUBLE WAKING UP. WITH NECK SURGERY 2008/ASPIRATED EVENTUALLY DIED PNEUMONIA ETC   GERD (gastroesophageal reflux disease)    Hypertension  Spinal stenosis of lumbar region     Family History: Family History  Problem Relation Age of Onset   Breast cancer Mother 36   Breast cancer Maternal Grandmother 49   Diabetes Father    Heart attack Father     Social History   Socioeconomic History   Marital status: Married    Spouse name: Not on file   Number of children: Not on file   Years of education: Not on file   Highest  education level: Not on file  Occupational History   Not on file  Tobacco Use   Smoking status: Never   Smokeless tobacco: Never  Vaping Use   Vaping Use: Never used  Substance and Sexual Activity   Alcohol use: Yes    Comment: occasionally   Drug use: No   Sexual activity: Not on file  Other Topics Concern   Not on file  Social History Narrative   Not on file   Social Determinants of Health   Financial Resource Strain: Not on file  Food Insecurity: Not on file  Transportation Needs: Not on file  Physical Activity: Not on file  Stress: Not on file  Social Connections: Not on file  Intimate Partner Violence: Not on file      Review of Systems  Constitutional:  Negative for chills, fatigue and unexpected weight change.  HENT:  Negative for congestion, postnasal drip, rhinorrhea, sneezing and sore throat.   Eyes:  Negative for redness.  Respiratory:  Negative for cough, chest tightness and shortness of breath.   Cardiovascular:  Negative for chest pain and palpitations.  Gastrointestinal:  Negative for abdominal pain, constipation, diarrhea, nausea and vomiting.  Genitourinary:  Negative for dysuria and frequency.  Musculoskeletal:  Positive for back pain. Negative for arthralgias, joint swelling and neck pain.  Skin:  Negative for rash.  Neurological: Negative.  Negative for tremors and numbness.  Hematological:  Negative for adenopathy. Does not bruise/bleed easily.  Psychiatric/Behavioral:  Positive for behavioral problems (Depression). Negative for sleep disturbance and suicidal ideas. The patient is nervous/anxious.    Vital Signs: BP 130/82 Comment: 146/90  Pulse (!) 102   Temp (!) 97.4 F (36.3 C)   Resp 16   Ht 5\' 6"  (1.676 m)   Wt 157 lb 12.8 oz (71.6 kg)   SpO2 98%   BMI 25.47 kg/m    Physical Exam Vitals and nursing note reviewed.  Constitutional:      General: She is not in acute distress.    Appearance: She is well-developed and normal weight.  She is not diaphoretic.  HENT:     Head: Normocephalic and atraumatic.     Mouth/Throat:     Pharynx: No oropharyngeal exudate.  Eyes:     Pupils: Pupils are equal, round, and reactive to light.  Neck:     Thyroid: No thyromegaly.     Vascular: No JVD.     Trachea: No tracheal deviation.  Cardiovascular:     Rate and Rhythm: Normal rate and regular rhythm.     Heart sounds: Normal heart sounds. No murmur heard.   No friction rub. No gallop.  Pulmonary:     Effort: Pulmonary effort is normal. No respiratory distress.     Breath sounds: No wheezing or rales.  Chest:     Chest wall: No tenderness.  Abdominal:     General: Bowel sounds are normal.     Palpations: Abdomen is soft.  Musculoskeletal:  General: Normal range of motion.     Cervical back: Normal range of motion and neck supple.  Lymphadenopathy:     Cervical: No cervical adenopathy.  Skin:    General: Skin is warm and dry.  Neurological:     Mental Status: She is alert and oriented to person, place, and time.     Cranial Nerves: No cranial nerve deficit.  Psychiatric:        Behavior: Behavior normal.        Thought Content: Thought content normal.        Judgment: Judgment normal.       Assessment/Plan: 1. GAD (generalized anxiety disorder) Will start on Lexapro and titrate up as needed - escitalopram (LEXAPRO) 5 MG tablet; Take 1 tablet (5 mg total) by mouth daily.  Dispense: 30 tablet; Refill: 2  2. Moderate episode of recurrent major depressive disorder (HCC) Will start on Lexapro and titrate up as needed - escitalopram (LEXAPRO) 5 MG tablet; Take 1 tablet (5 mg total) by mouth daily.  Dispense: 30 tablet; Refill: 2  3. Primary hypertension BP and HR improved on exam, continue current medications--refills sent - diltiazem (CARDIZEM CD) 120 MG 24 hr capsule; Take 1 capsule by mouth at bedtime for heart rate  Dispense: 90 capsule; Refill: 1 - triamterene-hydrochlorothiazide (MAXZIDE-25) 37.5-25 MG  tablet; Take 1 tablet by mouth daily.  Dispense: 90 tablet; Refill: 3  4. Mild intermittent asthma without complication Continue albuterol as needed  5. Impaired fasting glucose Elevated glucose on CMP, A1c normal--continue to monitor - POCT HgB A1C is 5.6  6. Elevated LFTs Improved, but still elevated--Will order [place] for further eval - US Abdomen Complete; Future  7. Gastroesophageal reflux disease without esophagitis - omeprazole (PRILOSEC) 40 MG capsule; TAKE ONE CAPSULE BY MOUTH EVERY DAY FOR HEART BURN  Dispense: 90 capsule; Refill: 3  8. Fever blister - famciclovir (FAMVIR) 500 MG tablet; Take 1 tablet (500 mg total) by mouth 2 (two) times daily.  Dispense: 60 tablet; Refill: 3   General Counseling: Pam Khan verbalizes understanding of the findings of todays visit and agrees with plan of treatment. I have discussed any further diagnostic evaluation that may be needed or ordered today. We also reviewed her medications today. she has been encouraged to call the office with any questions or concerns that should arise related to todays visit.    Orders Placed This Encounter  Procedures   US Abdomen Complete   POCT HgB A1C    Meds ordered this encounter  Medications   diltiazem (CARDIZEM CD) 120 MG 24 hr capsule    Sig: Take 1 capsule by mouth at bedtime for heart rate    Dispense:  90 capsule    Refill:  1   famciclovir (FAMVIR) 500 MG tablet    Sig: Take 1 tablet (500 mg total) by mouth 2 (two) times daily.    Dispense:  60 tablet    Refill:  3   omeprazole (PRILOSEC) 40 MG capsule    Sig: TAKE ONE CAPSULE BY MOUTH EVERY DAY FOR HEART BURN    Dispense:  90 capsule    Refill:  3   triamterene-hydrochlorothiazide (MAXZIDE-25) 37.5-25 MG tablet    Sig: Take 1 tablet by mouth daily.    Dispense:  90 tablet    Refill:  3   fluconazole (DIFLUCAN) 150 MG tablet    Sig: Take 1 tablet (150 mg total) by mouth once for 1 dose.    Dispense:  5 tablet  Refill:  0   escitalopram  (LEXAPRO) 5 MG tablet    Sig: Take 1 tablet (5 mg total) by mouth daily.    Dispense:  30 tablet    Refill:  2    This patient was seen by Lynn ItoLauren Eisha Chatterjee, PA-C in collaboration with Dr. Beverely RisenFozia Khan as a part of collaborative care agreement.   Total time spent:35 Minutes Time spent includes review of chart, medications, test results, and follow up plan with the patient.      Dr Lyndon CodeFozia M Khan Internal medicine

## 2021-07-01 ENCOUNTER — Other Ambulatory Visit: Payer: Self-pay

## 2021-07-01 ENCOUNTER — Ambulatory Visit (INDEPENDENT_AMBULATORY_CARE_PROVIDER_SITE_OTHER): Payer: Commercial Managed Care - PPO

## 2021-07-01 DIAGNOSIS — R7989 Other specified abnormal findings of blood chemistry: Secondary | ICD-10-CM | POA: Diagnosis not present

## 2021-07-04 ENCOUNTER — Other Ambulatory Visit: Payer: Self-pay | Admitting: Physician Assistant

## 2021-07-04 DIAGNOSIS — F331 Major depressive disorder, recurrent, moderate: Secondary | ICD-10-CM

## 2021-07-04 DIAGNOSIS — F411 Generalized anxiety disorder: Secondary | ICD-10-CM

## 2021-07-30 ENCOUNTER — Ambulatory Visit (INDEPENDENT_AMBULATORY_CARE_PROVIDER_SITE_OTHER): Payer: Commercial Managed Care - PPO | Admitting: Physician Assistant

## 2021-07-30 ENCOUNTER — Encounter: Payer: Self-pay | Admitting: Physician Assistant

## 2021-07-30 ENCOUNTER — Other Ambulatory Visit: Payer: Self-pay

## 2021-07-30 VITALS — BP 138/88 | HR 88 | Temp 97.8°F | Resp 16 | Ht 66.0 in | Wt 161.0 lb

## 2021-07-30 DIAGNOSIS — Z01419 Encounter for gynecological examination (general) (routine) without abnormal findings: Secondary | ICD-10-CM

## 2021-07-30 DIAGNOSIS — F331 Major depressive disorder, recurrent, moderate: Secondary | ICD-10-CM | POA: Diagnosis not present

## 2021-07-30 DIAGNOSIS — I10 Essential (primary) hypertension: Secondary | ICD-10-CM

## 2021-07-30 DIAGNOSIS — Z0001 Encounter for general adult medical examination with abnormal findings: Secondary | ICD-10-CM | POA: Diagnosis not present

## 2021-07-30 DIAGNOSIS — R300 Dysuria: Secondary | ICD-10-CM | POA: Diagnosis not present

## 2021-07-30 DIAGNOSIS — F411 Generalized anxiety disorder: Secondary | ICD-10-CM

## 2021-07-30 DIAGNOSIS — K760 Fatty (change of) liver, not elsewhere classified: Secondary | ICD-10-CM

## 2021-07-30 NOTE — Progress Notes (Signed)
[facility] [address] [place], [place] [address]  Internal MEDICINE  Office Visit Note  Patient Name: Pam Khan  [id number]  [id number]  Date of Service: 08/02/2021  Chief Complaint  Patient presents with   Annual Exam   Anxiety   Hypertension     HPI Pt is here for routine health maintenance examination -Lexapro is helping some but also situation has improved. May need to increase in future if situation worsens again -BP checked at home periodically and has been stable -mammogram up to date and due for repeat in feb, up to date on cologuard, pap done in 2018 -sleeping ok -Reviewed abdominal US which showed some evidence of fatty liver disease and discussed diet changes, otherwise normal [place]. Will continue to monitor -signed form for work stating CPE performed today -Labs previously reviewed  Current Medication: Outpatient Encounter Medications as of 07/30/2021  Medication Sig   albuterol (VENTOLIN HFA) 108 (90 Base) MCG/ACT inhaler Inhale 2 puffs into the lungs every 6 (six) hours as needed for wheezing or shortness of breath.   diltiazem (CARDIZEM CD) 120 MG 24 hr capsule Take 1 capsule by mouth at bedtime for heart rate   escitalopram (LEXAPRO) 5 MG tablet TAKE 1 TABLET (5 MG TOTAL) BY MOUTH DAILY.   famciclovir (FAMVIR) 500 MG tablet Take 1 tablet (500 mg total) by mouth 2 (two) times daily.   omeprazole (PRILOSEC) 40 MG capsule TAKE ONE CAPSULE BY MOUTH EVERY DAY FOR HEART BURN   triamterene-hydrochlorothiazide (MAXZIDE-25) 37.5-25 MG tablet Take 1 tablet by mouth daily.   No facility-administered encounter medications on file as of 07/30/2021.    Surgical History: Past Surgical History:  Procedure Laterality Date   BACK SURGERY  1985   CESAREAN SECTION  1989   COSMETIC SURGERY  1985   head   CYSTOSCOPY WITH STENT PLACEMENT Left 05/17/2019   Procedure: CYSTOSCOPY WITH STENT PLACEMENT;  Surgeon: Vanna Scotland, MD;  Location: [facility];  Service:  Urology;  Laterality: Left;   CYSTOSCOPY/URETEROSCOPY/HOLMIUM LASER/STENT PLACEMENT Left 05/25/2019   Procedure: CYSTOSCOPY/URETEROSCOPY/STENT Exchange;  Surgeon: Vanna Scotland, MD;  Location: [facility];  Service: Urology;  Laterality: Left;   DILATION AND CURETTAGE OF UTERUS  2007    Medical History: Past Medical History:  Diagnosis Date   Anxiety    Asthma    Barrett esophagus    DDD (degenerative disc disease), lumbar    Family history of adverse reaction to anesthesia    FATHER ALWAYS TROUBLE WAKING UP. WITH NECK SURGERY 2008/ASPIRATED EVENTUALLY DIED PNEUMONIA ETC   GERD (gastroesophageal reflux disease)    Hypertension    Spinal stenosis of lumbar region     Family History: Family History  Problem Relation Age of Onset   Breast cancer Mother 11   Breast cancer Maternal Grandmother 55   Diabetes Father    Heart attack Father       Review of Systems  Constitutional:  Negative for chills, fatigue and unexpected weight change.  HENT:  Negative for congestion, postnasal drip, rhinorrhea, sneezing and sore throat.   Eyes:  Negative for redness.  Respiratory:  Negative for cough, chest tightness and shortness of breath.   Cardiovascular:  Negative for chest pain and palpitations.  Gastrointestinal:  Negative for abdominal pain, constipation, diarrhea, nausea and vomiting.  Genitourinary:  Negative for dysuria and frequency.  Musculoskeletal:  Negative for arthralgias, back pain, joint swelling and neck pain.  Skin:  Negative for rash.  Neurological: Negative.  Negative for tremors  and numbness.  Hematological:  Negative for adenopathy. Does not bruise/bleed easily.  Psychiatric/Behavioral:  Negative for behavioral problems (Depression), sleep disturbance and suicidal ideas. The patient is not nervous/anxious.     Vital Signs: BP 138/88   Pulse 88   Temp 97.8 F (36.6 C)   Resp 16   Ht 5\' 6"  (1.676 m)   Wt 161 lb (73 kg)   SpO2 98%   BMI 25.99 kg/m    Physical  Exam Vitals and nursing note reviewed.  Constitutional:      General: She is not in acute distress.    Appearance: She is well-developed and normal weight. She is not diaphoretic.  HENT:     Head: Normocephalic and atraumatic.     Right Ear: External ear normal.     Left Ear: External ear normal.     Nose: Nose normal.     Mouth/Throat:     Pharynx: No oropharyngeal exudate.  Eyes:     General: No scleral icterus.       Right eye: No discharge.        Left eye: No discharge.     Conjunctiva/sclera: Conjunctivae normal.     Pupils: Pupils are equal, round, and reactive to light.  Neck:     Thyroid: No thyromegaly.     Vascular: No JVD.     Trachea: No tracheal deviation.  Cardiovascular:     Rate and Rhythm: Normal rate and regular rhythm.     Heart sounds: Normal heart sounds. No murmur heard.   No friction rub. No gallop.  Pulmonary:     Effort: Pulmonary effort is normal. No respiratory distress.     Breath sounds: Normal breath sounds. No stridor. No wheezing or rales.  Chest:     Chest wall: No tenderness.  Breasts:    Right: Normal. No mass.     Left: Normal. No mass.  Abdominal:     General: Bowel sounds are normal. There is no distension.     Palpations: Abdomen is soft. There is no mass.     Tenderness: There is no abdominal tenderness. There is no guarding or rebound.  Musculoskeletal:        General: No tenderness or deformity. Normal range of motion.     Cervical back: Normal range of motion and neck supple.  Lymphadenopathy:     Cervical: No cervical adenopathy.  Skin:    General: Skin is warm and dry.     Coloration: Skin is not pale.     Findings: No erythema or rash.  Neurological:     Mental Status: She is alert.     Cranial Nerves: No cranial nerve deficit.     Motor: No abnormal muscle tone.     Coordination: Coordination normal.     Deep Tendon Reflexes: Reflexes are normal and symmetric.  Psychiatric:        Behavior: Behavior normal.         Thought Content: Thought content normal.        Judgment: Judgment normal.     LABS: Recent Results (from the past 2160 hour(s))  Hepatic function panel     Status: Abnormal   Collection Time: 05/21/21  8:37 AM  Result Value Ref Range   Total Protein 7.4 6.0 - 8.5 g/dL   Albumin 4.8 3.8 - 4.9 g/dL   Bilirubin Total 1.3 (H) 0.0 - 1.2 mg/dL   Bilirubin, Direct 05/23/21 0.00 - 0.40 mg/dL   Alkaline Phosphatase 116  44 - 121 IU/L   AST 34 0 - 40 IU/L   ALT 64 (H) 0 - 32 IU/L  PTH, intact and calcium     Status: None   Collection Time: 05/21/21  8:37 AM  Result Value Ref Range   Calcium 10.1 8.7 - 10.2 mg/dL   PTH 52 15 - 65 pg/mL   PTH Interp Comment     Comment: Interpretation                 Intact PTH    Calcium                                 (pg/mL)      (mg/dL) Normal                          15 - 65     8.6 - 10.2 Primary Hyperparathyroidism         >65          >10.2 Secondary Hyperparathyroidism       >65          <10.2 Non-Parathyroid Hypercalcemia       <65          >10.2 Hypoparathyroidism                  <15          < 8.6 Non-Parathyroid Hypocalcemia    15 - 65          < 8.6   Calcium, ionized     Status: None   Collection Time: 05/21/21  8:37 AM  Result Value Ref Range   Calcium, Ion 5.2 4.5 - 5.6 mg/dL  Phosphorus     Status: None   Collection Time: 05/21/21  8:37 AM  Result Value Ref Range   Phosphorus 3.6 3.0 - 4.3 mg/dL  Fe+TIBC+Fer     Status: Abnormal   Collection Time: 05/21/21  8:37 AM  Result Value Ref Range   Total Iron Binding Capacity 350 250 - 450 ug/dL   UIBC 644 034 - 742 ug/dL   Iron 595 27 - 638 ug/dL   Iron Saturation 33 15 - 55 %   Ferritin 175 (H) 15 - 150 ng/mL  POCT HgB A1C     Status: None   Collection Time: 06/11/21  9:58 AM  Result Value Ref Range   Hemoglobin A1C 5.6 4.0 - 5.6 %   HbA1c POC (<> result, manual entry)     HbA1c, POC (prediabetic range)     HbA1c, POC (controlled diabetic range)    UA/M w/rflx Culture, Routine      Status: None (Preliminary result)   Collection Time: 07/30/21  4:26 PM   Specimen: Urine   Urine  Result Value Ref Range   Specific Gravity, UA 1.007 1.005 - 1.030   pH, UA 7.0 5.0 - 7.5   Color, UA Yellow Yellow   Appearance Ur Clear Clear   Leukocytes,UA Negative Negative   Protein,UA Negative Negative/Trace   Glucose, UA Negative Negative   Ketones, UA Negative Negative   RBC, UA Negative Negative   Bilirubin, UA Negative Negative   Urobilinogen, Ur 0.2 0.2 - 1.0 mg/dL   Nitrite, UA Negative Negative   Microscopic Examination Comment     Comment: Microscopic follows if indicated.   Microscopic Examination See below:     Comment:  Microscopic was indicated and was performed.   Urinalysis Reflex Comment     Comment: This specimen has reflexed to a Urine Culture.  Microscopic Examination     Status: Abnormal   Collection Time: 07/30/21  4:26 PM   Urine  Result Value Ref Range   WBC, UA 11-30 (A) 0 - 5 /hpf   RBC None seen 0 - 2 /hpf   Epithelial Cells (non renal) >10 (A) 0 - 10 /hpf   Casts None seen None seen /lpf   Bacteria, UA Few None seen/Few  Urine Culture, Reflex     Status: None (Preliminary result)   Collection Time: 07/30/21  4:26 PM   Urine  Result Value Ref Range   Urine Culture, Routine WILL FOLLOW        Assessment/Plan: 1. Encounter for general adult medical examination with abnormal findings CPE performed and form for work signed.  Patient is up-to-date on mammogram, colonoscopy, Pap.  Labs already reviewed at prior visit  2. Visit for gynecologic examination Breast exam performed today  3. Essential hypertension Stable, continue current medication  4. GAD (generalized anxiety disorder) Stable, will continue Lexapro at current dose and consider titrating up in future if needed  5. Moderate episode of recurrent major depressive disorder (HCC) Stable, will continue Lexapro at current dose and consider titrating up in future if needed  6. Fatty  liver Seen on abdominal ultrasound as part of work-up of elevated LFTs.  Discussed working on diet and will continue to monitor labs.  Patient may need referral to GI for further evaluation in the future  7. Dysuria - UA/M w/rflx Culture, Routine   General Counseling: Signa Kell understanding of the findings of todays visit and agrees with plan of treatment. I have discussed any further diagnostic evaluation that may be needed or ordered today. We also reviewed her medications today. she has been encouraged to call the office with any questions or concerns that should arise related to todays visit.    Counseling:    Orders Placed This Encounter  Procedures   Microscopic Examination   Urine Culture, Reflex   UA/M w/rflx Culture, Routine    No orders of the defined types were placed in this encounter.   This patient was seen by Lynn Ito, PA-C in collaboration with Dr. Beverely Risen as a part of collaborative care agreement.  Total time spent:35 Minutes  Time spent includes review of chart, medications, test results, and follow up plan with the patient.     Lyndon Code, MD  Internal Medicine

## 2021-08-04 LAB — UA/M W/RFLX CULTURE, ROUTINE
Bilirubin, UA: NEGATIVE
Glucose, UA: NEGATIVE
Ketones, UA: NEGATIVE
Leukocytes,UA: NEGATIVE
Nitrite, UA: NEGATIVE
Protein,UA: NEGATIVE
RBC, UA: NEGATIVE
Specific Gravity, UA: 1.007 (ref 1.005–1.030)
Urobilinogen, Ur: 0.2 mg/dL (ref 0.2–1.0)
pH, UA: 7 (ref 5.0–7.5)

## 2021-08-04 LAB — URINE CULTURE, REFLEX: Organism ID, Bacteria: NO GROWTH

## 2021-08-04 LAB — MICROSCOPIC EXAMINATION
Casts: NONE SEEN /lpf
Epithelial Cells (non renal): >10 /hpf — AB (ref 0–10)
RBC, Urine: NONE SEEN /hpf (ref 0–2)

## 2021-11-26 ENCOUNTER — Ambulatory Visit: Payer: Commercial Managed Care - PPO | Admitting: Physician Assistant

## 2021-12-07 ENCOUNTER — Ambulatory Visit: Payer: Commercial Managed Care - PPO | Admitting: Physician Assistant

## 2021-12-07 ENCOUNTER — Encounter: Payer: Self-pay | Admitting: Physician Assistant

## 2021-12-07 ENCOUNTER — Other Ambulatory Visit: Payer: Self-pay

## 2021-12-07 DIAGNOSIS — E559 Vitamin D deficiency, unspecified: Secondary | ICD-10-CM

## 2021-12-07 DIAGNOSIS — K760 Fatty (change of) liver, not elsewhere classified: Secondary | ICD-10-CM

## 2021-12-07 DIAGNOSIS — R5383 Other fatigue: Secondary | ICD-10-CM

## 2021-12-07 DIAGNOSIS — F411 Generalized anxiety disorder: Secondary | ICD-10-CM

## 2021-12-07 DIAGNOSIS — E782 Mixed hyperlipidemia: Secondary | ICD-10-CM

## 2021-12-07 DIAGNOSIS — I10 Essential (primary) hypertension: Secondary | ICD-10-CM

## 2021-12-07 DIAGNOSIS — E538 Deficiency of other specified B group vitamins: Secondary | ICD-10-CM

## 2021-12-07 DIAGNOSIS — R7989 Other specified abnormal findings of blood chemistry: Secondary | ICD-10-CM

## 2021-12-07 DIAGNOSIS — Z1231 Encounter for screening mammogram for malignant neoplasm of breast: Secondary | ICD-10-CM

## 2021-12-07 MED ORDER — DILTIAZEM HCL ER COATED BEADS 120 MG PO CP24: ORAL_CAPSULE | ORAL | 1 refills | Status: DC

## 2021-12-07 NOTE — Progress Notes (Signed)
[facility] [address] [place], [place] [address]  Internal MEDICINE  Office Visit Note  Patient Name: Pam Khan  [id number]  [id number]  Date of Service: 12/07/2021  Chief Complaint  Patient presents with   Follow-up   Gastroesophageal Reflux   Hypertension    HPI Pt is here for routine follow up -Some days high anxiety, admits she takes Lexapro some days and not others due to forgetting and will try to do better about taking daily. May try setting reminder. -BP is stable and does take meds daily, does not monitor daily but checks occasionally and is always well controlled -Once per week work out class with work, tries to take walks around office to take a break from sitting all day and will try to dothis more as able -Due for mammogram and repeat routine fasting blood work  Current Medication: Outpatient Encounter Medications as of 12/07/2021  Medication Sig   albuterol (VENTOLIN HFA) 108 (90 Base) MCG/ACT inhaler Inhale 2 puffs into the lungs every 6 (six) hours as needed for wheezing or shortness of breath.   escitalopram (LEXAPRO) 5 MG tablet TAKE 1 TABLET (5 MG TOTAL) BY MOUTH DAILY.   famciclovir (FAMVIR) 500 MG tablet Take 1 tablet (500 mg total) by mouth 2 (two) times daily.   omeprazole (PRILOSEC) 40 MG capsule TAKE ONE CAPSULE BY MOUTH EVERY DAY FOR HEART BURN   triamterene-hydrochlorothiazide (MAXZIDE-25) 37.5-25 MG tablet Take 1 tablet by mouth daily.   [DISCONTINUED] diltiazem (CARDIZEM CD) 120 MG 24 hr capsule Take 1 capsule by mouth at bedtime for heart rate   diltiazem (CARDIZEM CD) 120 MG 24 hr capsule Take 1 capsule by mouth at bedtime for heart rate   No facility-administered encounter medications on file as of 12/07/2021.    Surgical History: Past Surgical History:  Procedure Laterality Date   BACK SURGERY  1985   CESAREAN SECTION  1989   COSMETIC SURGERY  1985   head   CYSTOSCOPY WITH STENT PLACEMENT Left 05/17/2019   Procedure:  CYSTOSCOPY WITH STENT PLACEMENT;  Surgeon: Vanna Scotland, MD;  Location: [facility];  Service: Urology;  Laterality: Left;   CYSTOSCOPY/URETEROSCOPY/HOLMIUM LASER/STENT PLACEMENT Left 05/25/2019   Procedure: CYSTOSCOPY/URETEROSCOPY/STENT Exchange;  Surgeon: Vanna Scotland, MD;  Location: [facility];  Service: Urology;  Laterality: Left;   DILATION AND CURETTAGE OF UTERUS  2007    Medical History: Past Medical History:  Diagnosis Date   Anxiety    Asthma    Barrett esophagus    DDD (degenerative disc disease), lumbar    Family history of adverse reaction to anesthesia    FATHER ALWAYS TROUBLE WAKING UP. WITH NECK SURGERY 2008/ASPIRATED EVENTUALLY DIED PNEUMONIA ETC   GERD (gastroesophageal reflux disease)    Hypertension    Spinal stenosis of lumbar region     Family History: Family History  Problem Relation Age of Onset   Breast cancer Mother 107   Breast cancer Maternal Grandmother 58   Diabetes Father    Heart attack Father     Social History   Socioeconomic History   Marital status: Married    Spouse name: Not on file   Number of children: Not on file   Years of education: Not on file   Highest education level: Not on file  Occupational History   Not on file  Tobacco Use   Smoking status: Never   Smokeless tobacco: Never  Vaping Use   Vaping Use: Never used  Substance and Sexual Activity  Alcohol use: Yes    Comment: occasionally   Drug use: No   Sexual activity: Not on file  Other Topics Concern   Not on file  Social History Narrative   Not on file   Social Determinants of Health   Financial Resource Strain: Not on file  Food Insecurity: Not on file  Transportation Needs: Not on file  Physical Activity: Not on file  Stress: Not on file  Social Connections: Not on file  Intimate Partner Violence: Not on file      Review of Systems  Constitutional:  Negative for chills, fatigue and unexpected weight change.  HENT:  Negative for congestion,  postnasal drip, rhinorrhea, sneezing and sore throat.   Eyes:  Negative for redness.  Respiratory:  Negative for cough, chest tightness and shortness of breath.   Cardiovascular:  Negative for chest pain and palpitations.  Gastrointestinal:  Negative for abdominal pain, constipation, diarrhea, nausea and vomiting.  Genitourinary:  Negative for dysuria and frequency.  Musculoskeletal:  Negative for arthralgias, back pain, joint swelling and neck pain.  Skin:  Negative for rash.  Neurological: Negative.  Negative for tremors and numbness.  Hematological:  Negative for adenopathy. Does not bruise/bleed easily.  Psychiatric/Behavioral:  Negative for behavioral problems (Depression), sleep disturbance and suicidal ideas. The patient is not nervous/anxious.    Vital Signs: BP 139/82    Pulse 82    Temp 98.1 F (36.7 C)    Resp 16    Ht 5\' 6"  (1.676 m)    Wt 172 lb 6.4 oz (78.2 kg)    SpO2 98%    BMI 27.83 kg/m    Physical Exam Vitals and nursing note reviewed.  Constitutional:      General: She is not in acute distress.    Appearance: She is well-developed and normal weight. She is not diaphoretic.  HENT:     Head: Normocephalic and atraumatic.     Mouth/Throat:     Pharynx: No oropharyngeal exudate.  Eyes:     Pupils: Pupils are equal, round, and reactive to light.  Neck:     Thyroid: No thyromegaly.     Vascular: No JVD.     Trachea: No tracheal deviation.  Cardiovascular:     Rate and Rhythm: Normal rate and regular rhythm.     Heart sounds: Normal heart sounds. No murmur heard.   No friction rub. No gallop.  Pulmonary:     Effort: Pulmonary effort is normal. No respiratory distress.     Breath sounds: No wheezing or rales.  Chest:     Chest wall: No tenderness.  Abdominal:     General: Bowel sounds are normal.     Palpations: Abdomen is soft.  Musculoskeletal:        General: Normal range of motion.     Cervical back: Normal range of motion and neck supple.   Lymphadenopathy:     Cervical: No cervical adenopathy.  Skin:    General: Skin is warm and dry.  Neurological:     Mental Status: She is alert and oriented to person, place, and time.     Cranial Nerves: No cranial nerve deficit.  Psychiatric:        Behavior: Behavior normal.        Thought Content: Thought content normal.        Judgment: Judgment normal.       Assessment/Plan: 1. Primary hypertension Well controlled, continue current medications - diltiazem (CARDIZEM CD) 120 MG 24 hr  capsule; Take 1 capsule by mouth at bedtime for heart rate  Dispense: 90 capsule; Refill: 1  2. GAD (generalized anxiety disorder) Continue lexapro and set reminder to take daily  3. Fatty liver Will recheck labs - Comprehensive metabolic panel - Lipid Panel With LDL/HDL Ratio  4. Elevated LFTs - Comprehensive metabolic panel  5. Visit for screening mammogram - MM DIAG BREAST TOMO BILATERAL; Future  6. B12 deficiency - B12 and Folate Panel  7. Vitamin D deficiency - VITAMIN D 25 Hydroxy (Vit-D Deficiency, Fractures)  8. Abnormal thyroid blood test - TSH + free T4  9. Mixed hyperlipidemia - Lipid Panel With LDL/HDL Ratio  10. Other fatigue - CBC w/Diff/Platelet - TSH + free T4 - Fe+TIBC+Fer   General Counseling: Dondra SpryGail verbalizes understanding of the findings of todays visit and agrees with plan of treatment. I have discussed any further diagnostic evaluation that may be needed or ordered today. We also reviewed her medications today. she has been encouraged to call the office with any questions or concerns that should arise related to todays visit.    Orders Placed This Encounter  Procedures   MM DIAG BREAST TOMO BILATERAL   CBC w/Diff/Platelet   Comprehensive metabolic panel   Lipid Panel With LDL/HDL Ratio   TSH + free T4   Fe+TIBC+Fer   B12 and Folate Panel   VITAMIN D 25 Hydroxy (Vit-D Deficiency, Fractures)    Meds ordered this encounter  Medications    diltiazem (CARDIZEM CD) 120 MG 24 hr capsule    Sig: Take 1 capsule by mouth at bedtime for heart rate    Dispense:  90 capsule    Refill:  1    This patient was seen by Lynn ItoLauren Namari Breton, PA-C in collaboration with Dr. Beverely RisenFozia Khan as a part of collaborative care agreement.   Total time spent:30 Minutes Time spent includes review of chart, medications, test results, and follow up plan with the patient.      Dr Lyndon CodeFozia M Khan Internal medicine

## 2021-12-11 ENCOUNTER — Other Ambulatory Visit: Payer: Self-pay | Admitting: Physician Assistant

## 2021-12-11 DIAGNOSIS — Z1231 Encounter for screening mammogram for malignant neoplasm of breast: Secondary | ICD-10-CM

## 2021-12-11 DIAGNOSIS — N63 Unspecified lump in unspecified breast: Secondary | ICD-10-CM

## 2021-12-16 LAB — COMPREHENSIVE METABOLIC PANEL
ALT: 72 IU/L — ABNORMAL HIGH (ref 0–32)
AST: 49 IU/L — ABNORMAL HIGH (ref 0–40)
Albumin/Globulin Ratio: 2.1 (ref 1.2–2.2)
Albumin: 4.7 g/dL (ref 3.8–4.9)
Alkaline Phosphatase: 101 IU/L (ref 44–121)
BUN/Creatinine Ratio: 13 (ref 9–23)
BUN: 12 mg/dL (ref 6–24)
Bilirubin Total: 0.5 mg/dL (ref 0.0–1.2)
CO2: 23 mmol/L (ref 20–29)
Calcium: 9.8 mg/dL (ref 8.7–10.2)
Chloride: 102 mmol/L (ref 96–106)
Creatinine, Ser: 0.91 mg/dL (ref 0.57–1.00)
Globulin, Total: 2.2 g/dL (ref 1.5–4.5)
Glucose: 110 mg/dL — ABNORMAL HIGH (ref 70–99)
Potassium: 5 mmol/L (ref 3.5–5.2)
Sodium: 140 mmol/L (ref 134–144)
Total Protein: 6.9 g/dL (ref 6.0–8.5)
eGFR: 75 mL/min/{1.73_m2} (ref >59)

## 2021-12-16 LAB — CBC WITH DIFFERENTIAL/PLATELET
Basophils Absolute: 0 10*3/uL (ref 0.0–0.2)
Basos: 1 %
EOS (ABSOLUTE): 0.1 10*3/uL (ref 0.0–0.4)
Eos: 1 %
Hematocrit: 40.9 % (ref 34.0–46.6)
Hemoglobin: 14.2 g/dL (ref 11.1–15.9)
Immature Grans (Abs): 0 10*3/uL (ref 0.0–0.1)
Immature Granulocytes: 0 %
Lymphocytes Absolute: 1.6 10*3/uL (ref 0.7–3.1)
Lymphs: 27 %
MCH: 31.4 pg (ref 26.6–33.0)
MCHC: 34.7 g/dL (ref 31.5–35.7)
MCV: 91 fL (ref 79–97)
Monocytes Absolute: 0.4 10*3/uL (ref 0.1–0.9)
Monocytes: 7 %
Neutrophils Absolute: 3.8 10*3/uL (ref 1.4–7.0)
Neutrophils: 64 %
Platelets: 257 10*3/uL (ref 150–450)
RBC: 4.52 x10E6/uL (ref 3.77–5.28)
RDW: 12.3 % (ref 11.7–15.4)
WBC: 6 10*3/uL (ref 3.4–10.8)

## 2021-12-16 LAB — LIPID PANEL WITH LDL/HDL RATIO
Cholesterol, Total: 191 mg/dL (ref 100–199)
HDL: 42 mg/dL (ref >39)
LDL Chol Calc (NIH): 125 mg/dL — ABNORMAL HIGH (ref 0–99)
LDL/HDL Ratio: 3 ratio (ref 0.0–3.2)
Triglycerides: 136 mg/dL (ref 0–149)
VLDL Cholesterol Cal: 24 mg/dL (ref 5–40)

## 2021-12-16 LAB — IRON,TIBC AND FERRITIN PANEL
Ferritin: 53 ng/mL (ref 15–150)
Iron Saturation: 14 % — ABNORMAL LOW (ref 15–55)
Iron: 59 ug/dL (ref 27–159)
Total Iron Binding Capacity: 415 ug/dL (ref 250–450)
UIBC: 356 ug/dL (ref 131–425)

## 2021-12-16 LAB — VITAMIN D 25 HYDROXY (VIT D DEFICIENCY, FRACTURES): Vit D, 25-Hydroxy: 25.9 ng/mL — ABNORMAL LOW (ref 30.0–100.0)

## 2021-12-16 LAB — B12 AND FOLATE PANEL
Folate: 15.6 ng/mL (ref >3.0)
Vitamin B-12: 494 pg/mL (ref 232–1245)

## 2021-12-16 LAB — TSH+FREE T4
Free T4: 1.11 ng/dL (ref 0.82–1.77)
TSH: 1.48 u[IU]/mL (ref 0.450–4.500)

## 2021-12-24 ENCOUNTER — Telehealth: Payer: Self-pay

## 2021-12-25 ENCOUNTER — Other Ambulatory Visit: Payer: Self-pay | Admitting: Physician Assistant

## 2021-12-25 ENCOUNTER — Telehealth: Payer: Self-pay

## 2021-12-25 DIAGNOSIS — R7989 Other specified abnormal findings of blood chemistry: Secondary | ICD-10-CM

## 2021-12-25 DIAGNOSIS — R110 Nausea: Secondary | ICD-10-CM

## 2021-12-25 DIAGNOSIS — K760 Fatty (change of) liver, not elsewhere classified: Secondary | ICD-10-CM

## 2021-12-25 NOTE — Telephone Encounter (Signed)
-----   Message from Carlean Jews, PA-C sent at 12/24/2021  4:48 PM EST ----- Please let her know that her LFTs are elevated again, but similar to prior readings and we can continue to monitor, but may consider GI referral if rising or any symptoms. Her cholesterol is also a little elevated but improved from last year. She should supplement vit D and iron OTC

## 2021-12-25 NOTE — Telephone Encounter (Signed)
Pt notified for labs and we send referral for GI and do A1c on next visit

## 2021-12-25 NOTE — Telephone Encounter (Signed)
GI referral done

## 2021-12-29 ENCOUNTER — Telehealth: Payer: Self-pay

## 2021-12-29 NOTE — Telephone Encounter (Signed)
Scheduled for 03/11/2022

## 2021-12-31 ENCOUNTER — Ambulatory Visit
Admission: RE | Admit: 2021-12-31 | Discharge: 2021-12-31 | Disposition: A | Discharge status: Home / Self Care | Payer: Commercial Managed Care - PPO | Source: Ambulatory Visit | Attending: Physician Assistant | Admitting: Physician Assistant

## 2021-12-31 ENCOUNTER — Other Ambulatory Visit: Payer: Self-pay

## 2021-12-31 DIAGNOSIS — Z1231 Encounter for screening mammogram for malignant neoplasm of breast: Secondary | ICD-10-CM | POA: Diagnosis present

## 2021-12-31 DIAGNOSIS — N63 Unspecified lump in unspecified breast: Secondary | ICD-10-CM | POA: Insufficient documentation

## 2022-01-14 ENCOUNTER — Other Ambulatory Visit: Payer: Self-pay | Admitting: Physician Assistant

## 2022-01-14 DIAGNOSIS — F331 Major depressive disorder, recurrent, moderate: Secondary | ICD-10-CM

## 2022-01-14 DIAGNOSIS — F411 Generalized anxiety disorder: Secondary | ICD-10-CM

## 2022-02-25 ENCOUNTER — Encounter: Payer: Self-pay | Admitting: Nurse Practitioner

## 2022-02-25 ENCOUNTER — Ambulatory Visit (INDEPENDENT_AMBULATORY_CARE_PROVIDER_SITE_OTHER): Payer: Commercial Managed Care - PPO | Admitting: Nurse Practitioner

## 2022-02-25 VITALS — BP 140/85 | HR 95 | Temp 98.5°F | Resp 16 | Ht 66.0 in | Wt 174.6 lb

## 2022-02-25 DIAGNOSIS — N3000 Acute cystitis without hematuria: Secondary | ICD-10-CM | POA: Diagnosis not present

## 2022-02-25 DIAGNOSIS — B3731 Acute candidiasis of vulva and vagina: Secondary | ICD-10-CM

## 2022-02-25 DIAGNOSIS — R300 Dysuria: Secondary | ICD-10-CM

## 2022-02-25 LAB — POCT URINALYSIS DIPSTICK
Blood, UA: NEGATIVE
Glucose, UA: NEGATIVE
Nitrite, UA: NEGATIVE
Protein, UA: POSITIVE — AB
Spec Grav, UA: 1.015 (ref 1.010–1.025)
Urobilinogen, UA: 0.2 E.U./dL
pH, UA: 6.5 (ref 5.0–8.0)

## 2022-02-25 MED ORDER — FLUCONAZOLE 150 MG PO TABS: 150.0000 mg | ORAL_TABLET | Freq: Once | ORAL | 0 refills | Status: AC

## 2022-02-25 MED ORDER — CIPROFLOXACIN HCL 250 MG PO TABS: 250.0000 mg | ORAL_TABLET | Freq: Two times a day (BID) | ORAL | 0 refills | Status: AC

## 2022-02-25 NOTE — Progress Notes (Signed)
[facility] ?[address] ?[place], [place] [address] ? ?Internal MEDICINE  ?Office Visit Note ? ?Patient Name: Pam Khan ? [id number]  ?[id number] ? ?Date of Service: 02/25/2022 ? ?Chief Complaint  ?Patient presents with  ? Acute Visit  ? Urinary Tract Infection  ?  In the morning urine has odor, bladder pressure  ? ? ? ?HPI ?Pam Khan presents for an acute sick visit for bladder pressure, and urine has an odor.  She believes she may be developing a UTI and so she has come into the clinic to be evaluated and completed a urinalysis.  She did have trace leukocytes on her urinalysis so it was sent for a urine culture. ? ? ? ? ? ?Current Medication: ? ?Outpatient Encounter Medications as of 02/25/2022  ?Medication Sig  ? albuterol (VENTOLIN HFA) 108 (90 Base) MCG/ACT inhaler Inhale 2 puffs into the lungs every 6 (six) hours as needed for wheezing or shortness of breath.  ? ciprofloxacin (CIPRO) 250 MG tablet Take 1 tablet (250 mg total) by mouth 2 (two) times daily for 5 days.  ? diltiazem (CARDIZEM CD) 120 MG 24 hr capsule Take 1 capsule by mouth at bedtime for heart rate  ? escitalopram (LEXAPRO) 5 MG tablet TAKE 1 TABLET (5 MG TOTAL) BY MOUTH DAILY.  ? famciclovir (FAMVIR) 500 MG tablet Take 1 tablet (500 mg total) by mouth 2 (two) times daily.  ? omeprazole (PRILOSEC) 40 MG capsule TAKE ONE CAPSULE BY MOUTH EVERY DAY FOR HEART BURN  ? triamterene-hydrochlorothiazide (MAXZIDE-25) 37.5-25 MG tablet Take 1 tablet by mouth daily.  ? [EXPIRED] fluconazole (DIFLUCAN) 150 MG tablet Take 1 tablet (150 mg total) by mouth once for 1 dose. May take an additional dose after 3 days if still symptomatic.  ? [DISCONTINUED] fluconazole (DIFLUCAN) 150 MG tablet fluconazole 150 mg tablet ? TAKE 1 TABLET (150 MG TOTAL) BY MOUTH ONCE FOR 1 DOSE.  ? ?No facility-administered encounter medications on file as of 02/25/2022.  ? ? ? ? ?Medical History: ?Past Medical History:  ?Diagnosis Date  ? Anxiety   ? Asthma   ? Barrett  esophagus   ? DDD (degenerative disc disease), lumbar   ? Family history of adverse reaction to anesthesia   ? FATHER ALWAYS TROUBLE WAKING UP. WITH NECK SURGERY 2008/ASPIRATED EVENTUALLY DIED PNEUMONIA ETC  ? GERD (gastroesophageal reflux disease)   ? Hypertension   ? Spinal stenosis of lumbar region   ? ? ? ?Vital Signs: ?BP 140/85   Pulse 95   Temp 98.5 ?F (36.9 ?C)   Resp 16   Ht 5\' 6"  (1.676 m)   Wt 174 lb 9.6 oz (79.2 kg)   SpO2 97%   BMI 28.18 kg/m?  ? ? ?Review of Systems  ?Constitutional:  Positive for fatigue. Negative for appetite change, chills, fever and unexpected weight change.  ?HENT: Negative.    ?Respiratory: Negative.  Negative for cough, chest tightness, shortness of breath and wheezing.   ?Cardiovascular: Negative.  Negative for chest pain and palpitations.  ?Gastrointestinal: Negative.   ?Genitourinary:  Positive for difficulty urinating, dysuria and flank pain. Negative for frequency, urgency, vaginal bleeding, vaginal discharge and vaginal pain.  ?     Urine odor and bladder pressure  ? ?Physical Exam ?Vitals reviewed.  ?Constitutional:   ?   General: She is not in acute distress. ?   Appearance: Normal appearance. She is not ill-appearing.  ?HENT:  ?   Head: Normocephalic and atraumatic.  ?Eyes:  ?  Pupils: Pupils are equal, round, and reactive to light.  ?Cardiovascular:  ?   Rate and Rhythm: Normal rate and regular rhythm.  ?Pulmonary:  ?   Effort: Pulmonary effort is normal. No respiratory distress.  ?Abdominal:  ?   Tenderness: There is abdominal tenderness in the suprapubic area.  ?Neurological:  ?   Mental Status: She is alert and oriented to person, place, and time.  ?Psychiatric:     ?   Mood and Affect: Mood normal.     ?   Behavior: Behavior normal.  ? ? ? ? ?Assessment/Plan: ?1. Acute cystitis without hematuria ?Empiric antibiotic treatment prescribed based on symptoms and trace leukocytes in urinalysis, urine specimen sent for culture ?- ciprofloxacin (CIPRO) 250 MG  tablet; Take 1 tablet (250 mg total) by mouth 2 (two) times daily for 5 days.  Dispense: 10 tablet; Refill: 0 ? ?2. Vulvovaginal candidiasis ?Fluconazole prescribed to patient encourage patient to take 1 dose now and to keep the other doses as needed if symptoms continue or return. ?- fluconazole (DIFLUCAN) 150 MG tablet; Take 1 tablet (150 mg total) by mouth once for 1 dose. May take an additional dose after 3 days if still symptomatic.  Dispense: 3 tablet; Refill: 0 ? ?3. Dysuria ?Urinalysis was positive for trace leukocytes, urine culture sent. ?- POCT Urinalysis Dipstick ?- CULTURE, URINE COMPREHENSIVE ? ? ?General Counseling: Markham Jordan understanding of the findings of todays visit and agrees with plan of treatment. I have discussed any further diagnostic evaluation that may be needed or ordered today. We also reviewed her medications today. she has been encouraged to call the office with any questions or concerns that should arise related to todays visit. ? ? ? ?Counseling: ? ? ? ?Orders Placed This Encounter  ?Procedures  ? CULTURE, URINE COMPREHENSIVE  ? POCT Urinalysis Dipstick  ? ? ?Meds ordered this encounter  ?Medications  ? fluconazole (DIFLUCAN) 150 MG tablet  ?  Sig: Take 1 tablet (150 mg total) by mouth once for 1 dose. May take an additional dose after 3 days if still symptomatic.  ?  Dispense:  3 tablet  ?  Refill:  0  ? ciprofloxacin (CIPRO) 250 MG tablet  ?  Sig: Take 1 tablet (250 mg total) by mouth 2 (two) times daily for 5 days.  ?  Dispense:  10 tablet  ?  Refill:  0  ? ? ?Return if symptoms worsen or fail to improve. ? ?[place] Controlled Substance Database was reviewed by me for overdose risk score (ORS) ? ?Time spent:30 Minutes ?Time spent with patient included reviewing progress notes, labs, imaging studies, and discussing plan for follow up.  ? ?This patient was seen by Jonetta Osgood, FNP-C in collaboration with Dr. Clayborn Bigness as a part of collaborative care agreement. ? ?Oluwateniola Leitch R.  Valetta Fuller, MSN, FNP-C ?Internal Medicine ?

## 2022-02-27 ENCOUNTER — Encounter: Payer: Self-pay | Admitting: Nurse Practitioner

## 2022-03-01 LAB — CULTURE, URINE COMPREHENSIVE

## 2022-03-11 ENCOUNTER — Ambulatory Visit: Payer: Commercial Managed Care - PPO | Admitting: Gastroenterology

## 2022-03-11 ENCOUNTER — Encounter: Payer: Self-pay | Admitting: Gastroenterology

## 2022-03-11 ENCOUNTER — Other Ambulatory Visit: Payer: Self-pay

## 2022-03-11 VITALS — BP 143/88 | HR 103 | Temp 97.3°F | Ht 66.0 in | Wt 172.8 lb

## 2022-03-11 DIAGNOSIS — R7989 Other specified abnormal findings of blood chemistry: Secondary | ICD-10-CM

## 2022-03-11 DIAGNOSIS — K219 Gastro-esophageal reflux disease without esophagitis: Secondary | ICD-10-CM | POA: Diagnosis not present

## 2022-03-11 NOTE — Patient Instructions (Signed)
? ?Fatty Liver Disease ? ?The liver converts food into energy, removes toxic material from the blood, makes important proteins, and absorbs necessary vitamins from food. Fatty liver disease occurs when too much fat has built up in your liver cells. Fatty liver disease is also called hepatic steatosis. ?In many cases, fatty liver disease does not cause symptoms or problems. It is often diagnosed when tests are being done for other reasons. However, over time, fatty liver can cause inflammation that may lead to more serious liver problems, such as scarring of the liver (cirrhosis) and liver failure. ?Fatty liver is associated with insulin resistance, increased body fat, high blood pressure (hypertension), and high cholesterol. These are features of metabolic syndrome and increase your risk for stroke, diabetes, and heart disease. ?What are the causes? ?This condition may be caused by components of metabolic syndrome: ?Obesity. ?Insulin resistance. ?High cholesterol. ?Other causes: ?Alcohol abuse. ?Poor nutrition. ?Cushing syndrome. ?Pregnancy. ?Certain drugs. ?Poisons. ?Some viral infections. ?What increases the risk? ?You are more likely to develop this condition if you: ?Abuse alcohol. ?Are overweight. ?Have diabetes. ?Have hepatitis. ?Have a high triglyceride level. ?Are pregnant. ?What are the signs or symptoms? ?Fatty liver disease often does not cause symptoms. If symptoms do develop, they can include: ?Fatigue and weakness. ?Weight loss. ?Confusion. ?Nausea, vomiting, or abdominal pain. ?Yellowing of your skin and the white parts of your eyes (jaundice). ?Itchy skin. ?How is this diagnosed? ?This condition may be diagnosed by: ?A physical exam and your medical history. ?Blood tests. ?Imaging tests, such as an ultrasound, CT scan, or MRI. ?A liver biopsy. A small sample of liver tissue is removed using a needle. The sample is then looked at under a microscope. ?How is this treated? ?Fatty liver disease is often  caused by other health conditions. Treatment for fatty liver may involve medicines and lifestyle changes to manage conditions such as: ?Alcoholism. ?High cholesterol. ?Diabetes. ?Being overweight or obese. ?Follow these instructions at home: ? ?Do not drink alcohol. If you have trouble quitting, ask your health care provider how to safely quit with the help of medicine or a supervised program. This is important to keep your condition from getting worse. ?Eat a healthy diet as told by your health care provider. Ask your health care provider about working with a dietitian to develop an eating plan. ?Exercise regularly. This can help you lose weight and control your cholesterol and diabetes. Talk to your health care provider about an exercise plan and which activities are best for you. ?Take over-the-counter and prescription medicines only as told by your health care provider. ?Keep all follow-up visits. This is important. ?Contact a health care provider if: ?You have trouble controlling your: ?Blood sugar. This is especially important if you have diabetes. ?Cholesterol. ?Drinking of alcohol. ?Get help right away if: ?You have abdominal pain. ?You have jaundice. ?You have nausea and are vomiting. ?You vomit blood or material that looks like coffee grounds. ?You have stools that are black, tar-like, or bloody. ?Summary ?Fatty liver disease develops when too much fat builds up in the cells of your liver. ?Fatty liver disease often causes no symptoms or problems. However, over time, fatty liver can cause inflammation that may lead to more serious liver problems, such as scarring of the liver (cirrhosis). ?You are more likely to develop this condition if you abuse alcohol, are pregnant, are overweight, have diabetes, have hepatitis, or have high triglyceride or cholesterol levels. ?Contact your health care provider if you have trouble controlling your  blood sugar, cholesterol, or drinking of alcohol. ?This information is  not intended to replace advice given to you by your health care provider. Make sure you discuss any questions you have with your health care provider. ?Document Revised: 08/07/2020 Document Reviewed: 08/07/2020 ?Elsevier Patient Education ? [employer]. ? ? ?Food Choices for Gastroesophageal Reflux Disease, Adult ?When you have gastroesophageal reflux disease (GERD), the foods you eat and your eating habits are very important. Choosing the right foods can help ease your discomfort. Think about working with a food expert (dietitian) to help you make good choices. ?What are tips for following this plan? ?Reading food labels ?Look for foods that are low in saturated fat. Foods that may help with your symptoms include: ?Foods that have less than 5% of daily value (DV) of fat. ?Foods that have 0 grams of trans fat. ?Cooking ?Do not fry your food. ?Cook your food by baking, steaming, grilling, or broiling. These are all methods that do not need a lot of fat for cooking. ?To add flavor, try to use herbs that are low in spice and acidity. ?Meal planning ? ?Choose healthy foods that are low in fat, such as: ?Fruits and vegetables. ?Whole grains. ?Low-fat dairy products. ?Lean meats, fish, and poultry. ?Eat small meals often instead of eating 3 large meals each day. Eat your meals slowly in a place where you are relaxed. Avoid bending over or lying down until 2-3 hours after eating. ?Limit high-fat foods such as fatty meats or fried foods. ?Limit your intake of fatty foods, such as oils, butter, and shortening. ?Avoid the following as told by your doctor: ?Foods that cause symptoms. These may be different for different people. Keep a food diary to keep track of foods that cause symptoms. ?Alcohol. ?Drinking a lot of liquid with meals. ?Eating meals during the 2-3 hours before bed. ?Lifestyle ?Stay at a healthy weight. Ask your doctor what weight is healthy for you. If you need to lose weight, work with your doctor to do  so safely. ?Exercise for at least 30 minutes on 5 or more days each week, or as told by your doctor. ?Wear loose-fitting clothes. ?Do not smoke or use any products that contain nicotine or tobacco. If you need help quitting, ask your doctor. ?Sleep with the head of your bed higher than your feet. Use a wedge under the mattress or blocks under the bed frame to raise the head of the bed. ?Chew sugar-free gum after meals. ?What foods should eat? ? ?Eat a healthy, well-balanced diet of fruits, vegetables, whole grains, low-fat dairy products, lean meats, fish, and poultry. Each person is different. ?Foods that may cause symptoms in one person may not cause any symptoms in another person. Work with your doctor to find foods that are safe for you. ?The items listed above may not be a complete list of what you can eat and drink. Contact a food expert for more options. ?What foods should I avoid? ?Limiting some of these foods may help in managing the symptoms of GERD. Everyone is different. Talk with a food expert or your doctor to help you find the exact foods to avoid, if any. ?Fruits ?Any fruits prepared with added fat. Any fruits that cause symptoms. For some people, this may include citrus fruits, such as oranges, grapefruit, pineapple, and lemons. ?Vegetables ?Deep-fried vegetables. [place] fries. Any vegetables prepared with added fat. Any vegetables that cause symptoms. For some people, this may include tomatoes and tomato products, chili peppers,  onions and garlic, and horseradish. ?Grains ?Pastries or quick breads with added fat. ?Meats and other proteins ?High-fat meats, such as fatty beef or pork, hot dogs, ribs, ham, sausage, salami, and bacon. Fried meat or protein, including fried fish and fried chicken. Nuts and nut butters, in large amounts. ?Dairy ?Whole milk and chocolate milk. Sour cream. Cream. Ice cream. Cream cheese. Milkshakes. ?Fats and oils ?Butter. Margarine. Shortening. Ghee. ?Beverages ?Coffee  and tea, with or without caffeine. Carbonated beverages. Sodas. Energy drinks. Fruit juice made with acidic fruits, such as orange or grapefruit. Tomato juice. Alcoholic drinks. ?Sweets and desserts ?C

## 2022-03-11 NOTE — Progress Notes (Signed)
?  ?Jonathon Bellows MD, MRCP(U.K) ?[address]  ?[address]  ?[place], [place] [address]  ?Main: [phone]  ?Fax: [phone] ? ? ?Gastroenterology Consultation ? ?Referring Provider:     Mylinda Latina, PA* ?Primary Care Physician:  Lavera Guise, MD ?Primary Gastroenterologist:  Dr. Jonathon Bellows  ?Reason for Consultation:   Abnormal LFTs ?      ? HPI:   ?Pam Khan is a 51 y.o. y/o female referred for consultation & management  by Dr. Humphrey Rolls, Timoteo Gaul, MD.   ? ? ? ?First noted abnormality in LFT's in over a year elevated AST and ALT predominantly normal total bilirubin. ?Alcohol use very occasional  ?Drug use none ?Over the counter herbal supplements none ?Abdominal pain none ?Tattoos yes professional ?Armed forces logistics/support/administrative officer none ?Prior blood transfusion none ?Incarceration none ?History of travel none ?Family history of liver disease none ? ?Complains of heartburn for many years takes Prilosec as needed.  Up to 2 episodes of heartburn a week.  Was told back in 2017 she had may have Barrett's esophagus would like to know if it still persists. ?Right upper quadrant ultrasound in 06/27/2021 showed fatty liver. ? ?  Latest Ref Rng & Units 12/15/2021  ?  8:20 AM 05/21/2021  ?  8:37 AM 12/18/2020  ?  8:04 AM  ?Hepatic Function  ?Total Protein 6.0 - 8.5 g/dL 6.9   7.4   7.6    ?Albumin 3.8 - 4.9 g/dL 4.7   4.8   4.9    ?AST 0 - 40 IU/L 49   34   49    ?ALT 0 - 32 IU/L 72   64   72    ?Alk Phosphatase 44 - 121 IU/L 101   116   117    ?Total Bilirubin 0.0 - 1.2 mg/dL 0.5   1.3   0.7    ?Bilirubin, Direct 0.00 - 0.40 mg/dL  0.33     ? ? ?Prior GI note by Dr. Gustavo Lah in 2017 said that there was concerns of Barrett's esophagus with focal intestinal metaplasia at the GE junction.  Had been on omeprazole.  In 2010 had a colonoscopy that showed hemorrhoids. ?Past Medical History:  ?Diagnosis Date  ? Anxiety   ? Asthma   ? Barrett esophagus   ? DDD (degenerative disc disease), lumbar   ? Family history of adverse reaction to  anesthesia   ? FATHER ALWAYS TROUBLE WAKING UP. WITH NECK SURGERY 2008/ASPIRATED EVENTUALLY DIED PNEUMONIA ETC  ? GERD (gastroesophageal reflux disease)   ? Hypertension   ? Spinal stenosis of lumbar region   ? ? ?Past Surgical History:  ?Procedure Laterality Date  ? [place]  ? [place]  ? [place]  ? head  ? CYSTOSCOPY WITH STENT PLACEMENT Left 05/17/2019  ? Procedure: CYSTOSCOPY WITH STENT PLACEMENT;  Surgeon: Hollice Espy, MD;  Location: [facility] ORS;  Service: Urology;  Laterality: Left;  ? CYSTOSCOPY/URETEROSCOPY/HOLMIUM LASER/STENT PLACEMENT Left 05/25/2019  ? Procedure: CYSTOSCOPY/URETEROSCOPY/STENT Exchange;  Surgeon: Hollice Espy, MD;  Location: [facility] ORS;  Service: Urology;  Laterality: Left;  ? DILATION AND CURETTAGE OF UTERUS  2007  ? ? ?Prior to Admission medications   ?Medication Sig Start Date End Date Taking? Authorizing Provider  ?albuterol (VENTOLIN HFA) 108 (90 Base) MCG/ACT inhaler Inhale 2 puffs into the lungs every 6 (six) hours as needed for wheezing or shortness of breath. 05/22/20   Ronnell Freshwater, NP  ?diltiazem (CARDIZEM  CD) 120 MG 24 hr capsule Take 1 capsule by mouth at bedtime for heart rate 12/07/21   McDonough, Lauren K, PA-C  ?diltiazem (TIAZAC) 120 MG 24 hr capsule Take 1 capsule by mouth at bedtime.    [provider]  ?escitalopram (LEXAPRO) 5 MG tablet TAKE 1 TABLET (5 MG TOTAL) BY MOUTH DAILY. 01/14/22   Lavera Guise, MD  ?famciclovir (FAMVIR) 500 MG tablet Take 1 tablet (500 mg total) by mouth 2 (two) times daily. 06/11/21   McDonough, Si Gaul, PA-C  ?norethindrone (ORTHO MICRONOR) 0.35 MG tablet Take 1 tablet by mouth daily.    [provider]  ?omeprazole (PRILOSEC) 40 MG capsule TAKE ONE CAPSULE BY MOUTH EVERY DAY FOR HEART BURN 06/11/21   McDonough, Si Gaul, PA-C  ?triamterene-hydrochlorothiazide (MAXZIDE-25) 37.5-25 MG tablet Take 1 tablet by mouth daily. 06/11/21   McDonough, Si Gaul, PA-C  ? ? ?Family History  ?Problem  Relation Age of Onset  ? Breast cancer Mother 80  ? Breast cancer Maternal Grandmother 60  ? Diabetes Father   ? Heart attack Father   ?  ? ?Social History  ? ?Tobacco Use  ? Smoking status: Never  ? Smokeless tobacco: Never  ?Vaping Use  ? Vaping Use: Never used  ?Substance Use Topics  ? Alcohol use: Yes  ?  Comment: occasionally  ? Drug use: No  ? ? ?Allergies as of 03/11/2022  ? (No Known Allergies)  ? ? ?Review of Systems:    ?All systems reviewed and negative except where noted in HPI. ? ? Physical Exam:  ?BP (!) 143/88   Pulse (!) 103   Temp (!) 97.3 ?F (36.3 ?C) (Oral)   Ht _0  (1.676 m)   Wt 172 lb 12.8 oz (78.4 kg)   BMI 27.89 kg/m?  ?No LMP recorded. (Menstrual status: Oral contraceptives). ?Psych:  Alert and cooperative. Normal mood and affect. ?General:   Alert,  Well-developed, well-nourished, pleasant and cooperative in NAD ?Head:  Normocephalic and atraumatic. ?Eyes:  Sclera clear, no icterus.   Conjunctiva pink. ?Ears:  Normal auditory acuity. ?Neurologic:  Alert and oriented x3;  grossly normal neurologically. ?Psych:  Alert and cooperative. Normal mood and affect. ? ?Imaging Studies: ?No results found. ? ?Assessment and Plan:  ? ?Pam Khan is a 51 y.o. y/o female has been referred for abnormal LFTs.  No high risk behavior.  She has had some professional tattoos.  Very likely this is secondary to nonalcoholic fatty liver disease.  We will rule out other autoimmune conditions.  She also has a history of heartburn and there is mention of Barrett's esophagus although the location of the biopsies taken according to Dr. Marton Redwood report is not indicated of Barrett's esophagus.  We will evaluate this further. ? ?Plan ?1.  Nonalcoholic fatty liver disease discussed about lifestyle changes including weight loss healthy eating Mediterranean diet patient information provided.  Is a new drug which is going to be out in the market in September of this year to treat particularly this condition with  excellent results based on early reports.  I will discuss this with her in December of this year to see if she would be a candidate ? ?2.  GERD patient information provided since her symptoms occasional to use Pepcid as needed continue lifestyle changes with weight loss using a wedge pillow avoiding heavy meals right before bedtime and having dinner 2 to 3 hours before bedtime.  We will proceed with endoscopy to evaluate if she  truly has Barrett's esophagus although my suspicion is low at this point of time. ? ? ?I have discussed alternative options, risks & benefits,  which include, but are not limited to, bleeding, infection, perforation,respiratory complication & drug reaction.  The patient agrees with this plan & written consent will be obtained.   ? ? ?Follow up in 6-8 weeks ? ?Dr Jonathon Bellows MD,MRCP(U.K) ? ?

## 2022-03-16 LAB — HEPATITIS B SURFACE ANTIBODY,QUALITATIVE: Hep B Surface Ab, Qual: NONREACTIVE

## 2022-03-16 LAB — IRON,TIBC AND FERRITIN PANEL
Ferritin: 70 ng/mL (ref 15–150)
Iron Saturation: 17 % (ref 15–55)
Iron: 76 ug/dL (ref 27–159)
Total Iron Binding Capacity: 455 ug/dL — ABNORMAL HIGH (ref 250–450)
UIBC: 379 ug/dL (ref 131–425)

## 2022-03-16 LAB — HEPATITIS B E ANTIBODY: Hep B E Ab: NEGATIVE

## 2022-03-16 LAB — IMMUNOGLOBULINS A/E/G/M, SERUM
IgA/Immunoglobulin A, Serum: 166 mg/dL (ref 87–352)
IgE (Immunoglobulin E), Serum: 3 IU/mL — ABNORMAL LOW (ref 6–495)
IgG (Immunoglobin G), Serum: 837 mg/dL (ref 586–1602)
IgM (Immunoglobulin M), Srm: 94 mg/dL (ref 26–217)

## 2022-03-16 LAB — ALPHA-1-ANTITRYPSIN: A-1 Antitrypsin: 142 mg/dL (ref 101–187)

## 2022-03-16 LAB — HEPATITIS C ANTIBODY: Hep C Virus Ab: NONREACTIVE

## 2022-03-16 LAB — HEPATITIS B CORE ANTIBODY, TOTAL: Hep B Core Total Ab: NEGATIVE

## 2022-03-16 LAB — HEPATITIS B SURFACE ANTIGEN: Hepatitis B Surface Ag: NEGATIVE

## 2022-03-16 LAB — CERULOPLASMIN: Ceruloplasmin: 27.3 mg/dL (ref 19.0–39.0)

## 2022-03-16 LAB — ANTI-MICROSOMAL ANTIBODY LIVER / KIDNEY: LKM1 Ab: 0.8 Units (ref 0.0–20.0)

## 2022-03-16 LAB — HEPATITIS B E ANTIGEN: Hep B E Ag: NEGATIVE

## 2022-03-16 LAB — MITOCHONDRIAL/SMOOTH MUSCLE AB PNL
Mitochondrial Ab: <20 Units (ref 0.0–20.0)
Smooth Muscle Ab: 2 Units (ref 0–19)

## 2022-03-16 LAB — CK: Total CK: 304 U/L — ABNORMAL HIGH (ref 32–182)

## 2022-03-16 LAB — HEPATITIS A ANTIBODY, TOTAL: hep A Total Ab: POSITIVE — AB

## 2022-03-16 LAB — CELIAC DISEASE AB SCREEN W/RFX
Antigliadin Abs, IgA: 3 units (ref 0–19)
Transglutaminase IgA: <2 U/mL (ref 0–3)

## 2022-03-16 LAB — HIV ANTIBODY (ROUTINE TESTING W REFLEX): HIV Screen 4th Generation wRfx: NONREACTIVE

## 2022-03-16 LAB — ANA: Anti Nuclear Antibody (ANA): NEGATIVE

## 2022-03-26 ENCOUNTER — Other Ambulatory Visit: Payer: Self-pay

## 2022-03-26 DIAGNOSIS — J4520 Mild intermittent asthma, uncomplicated: Secondary | ICD-10-CM

## 2022-03-26 MED ORDER — ALBUTEROL SULFATE HFA 108 (90 BASE) MCG/ACT IN AERS: 2.0000 | INHALATION_SPRAY | Freq: Four times a day (QID) | RESPIRATORY_TRACT | 3 refills | Status: DC | PRN

## 2022-04-08 ENCOUNTER — Ambulatory Visit: Payer: Commercial Managed Care - PPO | Admitting: Physician Assistant

## 2022-04-11 NOTE — Progress Notes (Signed)
Maritza inform   1. CK elevated check if on statin  2. Recheck in a week - if still elevated needs to discuss with Lyndon Code, MD if on a statin to hold and see

## 2022-04-20 ENCOUNTER — Telehealth: Payer: Self-pay

## 2022-04-20 DIAGNOSIS — R748 Abnormal levels of other serum enzymes: Secondary | ICD-10-CM

## 2022-04-20 NOTE — Addendum Note (Signed)
Addended by: Adela Ports on: 04/20/2022 05:03 PM   Modules accepted: Orders

## 2022-04-20 NOTE — Telephone Encounter (Signed)
-----   Message from Wyline Mood, MD sent at 04/11/2022  1:19 PM EDT ----- Kandis Cocking inform   1. CK elevated check if on statin  2. Recheck in a week - if still elevated needs to discuss with Lyndon Code, MD if on a statin to hold and see

## 2022-04-20 NOTE — Telephone Encounter (Signed)
Called patient but was not able to speak with her because her phone kept ringing and at the end it stated that her voicemail was not set-up. I will send her a letter and a message through MyChart letting her know that Dr. Tobi Bastos wants to recheck her CK level. I will also send a copy of her lab results to her PCP so they know what Dr. Johnney Killian plan is.

## 2022-05-05 ENCOUNTER — Other Ambulatory Visit: Payer: Self-pay

## 2022-05-06 ENCOUNTER — Ambulatory Visit: Payer: Commercial Managed Care - PPO | Admitting: Gastroenterology

## 2022-05-06 ENCOUNTER — Encounter: Payer: Self-pay | Admitting: Gastroenterology

## 2022-05-06 ENCOUNTER — Other Ambulatory Visit: Payer: Self-pay

## 2022-05-06 VITALS — BP 152/91 | HR 82 | Temp 98.9°F | Ht 66.0 in | Wt 169.8 lb

## 2022-05-06 DIAGNOSIS — K219 Gastro-esophageal reflux disease without esophagitis: Secondary | ICD-10-CM

## 2022-05-06 DIAGNOSIS — R748 Abnormal levels of other serum enzymes: Secondary | ICD-10-CM | POA: Diagnosis not present

## 2022-05-06 DIAGNOSIS — K760 Fatty (change of) liver, not elsewhere classified: Secondary | ICD-10-CM

## 2022-05-06 DIAGNOSIS — R7989 Other specified abnormal findings of blood chemistry: Secondary | ICD-10-CM

## 2022-05-06 DIAGNOSIS — Z23 Encounter for immunization: Secondary | ICD-10-CM | POA: Diagnosis not present

## 2022-05-06 NOTE — Patient Instructions (Addendum)
Please remember to have your upper endoscopy on July 28, 2022.  Fatty Liver Disease  The liver converts food into energy, removes toxic material from the blood, makes important proteins, and absorbs necessary vitamins from food. Fatty liver disease occurs when too much fat has built up in your liver cells. Fatty liver disease is also called hepatic steatosis. In many cases, fatty liver disease does not cause symptoms or problems. It is often diagnosed when tests are being done for other reasons. However, over time, fatty liver can cause inflammation that may lead to more serious liver problems, such as scarring of the liver (cirrhosis) and liver failure. Fatty liver is associated with insulin resistance, increased body fat, high blood pressure (hypertension), and high cholesterol. These are features of metabolic syndrome and increase your risk for stroke, diabetes, and heart disease. What are the causes? This condition may be caused by components of metabolic syndrome: Obesity. Insulin resistance. High cholesterol. Other causes: Alcohol abuse. Poor nutrition. Cushing syndrome. Pregnancy. Certain drugs. Poisons. Some viral infections. What increases the risk? You are more likely to develop this condition if you: Abuse alcohol. Are overweight. Have diabetes. Have hepatitis. Have a high triglyceride level. Are pregnant. What are the signs or symptoms? Fatty liver disease often does not cause symptoms. If symptoms do develop, they can include: Fatigue and weakness. Weight loss. Confusion. Nausea, vomiting, or abdominal pain. Yellowing of your skin and the white parts of your eyes (jaundice). Itchy skin. How is this diagnosed? This condition may be diagnosed by: A physical exam and your medical history. Blood tests. Imaging tests, such as an ultrasound, CT scan, or MRI. A liver biopsy. A small sample of liver tissue is removed using a needle. The sample is then looked at  under a microscope. How is this treated? Fatty liver disease is often caused by other health conditions. Treatment for fatty liver may involve medicines and lifestyle changes to manage conditions such as: Alcoholism. High cholesterol. Diabetes. Being overweight or obese. Follow these instructions at home:  Do not drink alcohol. If you have trouble quitting, ask your health care provider how to safely quit with the help of medicine or a supervised program. This is important to keep your condition from getting worse. Eat a healthy diet as told by your health care provider. Ask your health care provider about working with a dietitian to develop an eating plan. Exercise regularly. This can help you lose weight and control your cholesterol and diabetes. Talk to your health care provider about an exercise plan and which activities are best for you. Take over-the-counter and prescription medicines only as told by your health care provider. Keep all follow-up visits. This is important. Contact a health care provider if: You have trouble controlling your: Blood sugar. This is especially important if you have diabetes. Cholesterol. Drinking of alcohol. Get help right away if: You have abdominal pain. You have jaundice. You have nausea and are vomiting. You vomit blood or material that looks like coffee grounds. You have stools that are black, tar-like, or bloody. Summary Fatty liver disease develops when too much fat builds up in the cells of your liver. Fatty liver disease often causes no symptoms or problems. However, over time, fatty liver can cause inflammation that may lead to more serious liver problems, such as scarring of the liver (cirrhosis). You are more likely to develop this condition if you abuse alcohol, are pregnant, are overweight, have diabetes, have hepatitis, or have high triglyceride or cholesterol levels.  Contact your health care provider if you have trouble controlling your  blood sugar, cholesterol, or drinking of alcohol. This information is not intended to replace advice given to you by your health care provider. Make sure you discuss any questions you have with your health care provider. Document Revised: 08/07/2020 Document Reviewed: 08/07/2020 Elsevier Patient Education  2023 [employer].

## 2022-05-06 NOTE — Progress Notes (Signed)
Wyline Mood MD, MRCP(U.K) [address]  [address]  [place], [place] [address]  Main: [phone]  Fax: [phone]   Primary Care Physician: Pam Code, MD  Primary Gastroenterologist:  Dr. Wyline Mood   Chief Complaint  Patient presents with   Elevated LFTs    HPI: Pam Khan is a 51 y.o. female   Summary of history :  Initially referred and seen on 03/11/2022 for abnormal LFTs.  Ongoing for over a year with elevated AST and ALT, occasional alcohol use, no drug use or herbal supplements.  Had professional tattoos.  No prior blood transfusions or incarceration or family history of liver disease.  Has been on Prilosec for heartburn 2 episodes per week right upper quadrant ultrasound in 06/27/2021 showed fatty liver.  Previously used to see Dr. Marva Panda and there was concerns of Barrett's esophagus.    Interval history   03/11/2022-05/06/2022   03/11/2022: CK elevated advised to stop any statins, immune to hepatitis A, not immune to hepatitis B, negative for hepatitis B and C virus, autoimmune work-up was negative, celiac serology negative.  She did not get her upper endoscopy performed as recommended last time as she did not have the money.  Takes Prilosec 40 mg once a day after her meals which I suggested should be taken before her meals does have some chest discomfort at times which she is going to discuss with her PCP I offered to refer her to a cardiologist but she said she will discuss it further with Dr. Welton Flakes.  Denies any specific muscle aches or pains   Current Outpatient Medications  Medication Sig Dispense Refill   albuterol (VENTOLIN HFA) 108 (90 Base) MCG/ACT inhaler Inhale 2 puffs into the lungs every 6 (six) hours as needed for wheezing or shortness of breath. 18 g 3   diltiazem (CARDIZEM CD) 120 MG 24 hr capsule Take 1 capsule by mouth at bedtime for heart rate 90 capsule 1   escitalopram (LEXAPRO) 5 MG tablet TAKE 1 TABLET (5 MG TOTAL) BY MOUTH DAILY. 90  tablet 1   famciclovir (FAMVIR) 500 MG tablet Take 1 tablet by mouth 2 (two) times daily.     triamterene-hydrochlorothiazide (MAXZIDE-25) 37.5-25 MG tablet Take 1 tablet by mouth daily. 90 tablet 3   fluconazole (DIFLUCAN) 150 MG tablet Take 1 tablet by mouth once. (Patient not taking: Reported on 05/06/2022)     omeprazole (PRILOSEC) 40 MG capsule TAKE ONE CAPSULE BY MOUTH EVERY DAY FOR HEART BURN (Patient not taking: Reported on 05/06/2022) 90 capsule 3   No current facility-administered medications for this visit.    Allergies as of 05/06/2022   (No Known Allergies)    ROS:  General: Negative for anorexia, weight loss, fever, chills, fatigue, weakness. ENT: Negative for hoarseness, difficulty swallowing , nasal congestion. CV: Negative for chest pain, angina, palpitations, dyspnea on exertion, peripheral edema.  Respiratory: Negative for dyspnea at rest, dyspnea on exertion, cough, sputum, wheezing.  GI: See history of present illness. GU:  Negative for dysuria, hematuria, urinary incontinence, urinary frequency, nocturnal urination.  Endo: Negative for unusual weight change.    Physical Examination:   BP (!) 152/91   Pulse 82   Temp 98.9 F (37.2 C) (Oral)   Ht 5\' 6"  (1.676 m)   Wt 169 lb 12.8 oz (77 kg)   BMI 27.41 kg/m   General: Well-nourished, well-developed in no acute distress.  Eyes: No icterus. Conjunctivae pink. Mouth: Oropharyngeal mucosa moist and pink ,  no lesions erythema or exudate. Neuro: Alert and oriented x 3.  Grossly intact. Skin: Warm and dry, no jaundice.   Psych: Alert and cooperative, normal mood and affect.   Imaging Studies: No results found.  Assessment and Plan:   Pam Khan is a 51 y.o. y/o female here to follow-up for abnormal LFTs secondary to nonalcoholic fatty liver disease.  Negative work-up for autoimmune hepatitis as well as viral hepatitis.  At her last visit his CK was elevated denies any muscle aches or pains we will repeat  it today history of possible Barrett's esophagus by Dr. Marva Panda and his last endoscopy report recommended EEG last visit did not obtain it we will schedule it in September as per her request.    Plan 1.  Nonalcoholic fatty liver disease discussed about lifestyle changes .  A new drug which is going to be out in the market in September of this year to treat particularly this condition with excellent results based on early reports.  I will discuss this with her in December of this year to see if she would be a candidate.  Needs hepatitis B vaccine, recheck LFTs, GGT, CK as it was previously elevated.  If CK still elevated will refer her to rheumatology   2.  Possible history of Barrett's esophagus: Will require EGD to confirm she would like to schedule it in September.  3.  Advised to take a PPI before meals instead of after meals for heartburn  I have discussed alternative options, risks & benefits,  which include, but are not limited to, bleeding, infection, perforation,respiratory complication & drug reaction.  The patient agrees with this plan & written consent will be obtained.        Dr Wyline Mood  MD,MRCP [facility]) Follow up in 6 months

## 2022-05-07 ENCOUNTER — Telehealth: Payer: Self-pay

## 2022-05-07 LAB — GAMMA GT: GGT: 34 IU/L (ref 0–60)

## 2022-05-07 LAB — HEPATIC FUNCTION PANEL
ALT: 59 IU/L — ABNORMAL HIGH (ref 0–32)
AST: 38 IU/L (ref 0–40)
Albumin: 4.9 g/dL (ref 3.8–4.9)
Alkaline Phosphatase: 100 IU/L (ref 44–121)
Bilirubin Total: 0.6 mg/dL (ref 0.0–1.2)
Bilirubin, Direct: 0.18 mg/dL (ref 0.00–0.40)
Total Protein: 7.3 g/dL (ref 6.0–8.5)

## 2022-05-07 LAB — CK: Total CK: 103 U/L (ref 32–182)

## 2022-05-07 NOTE — Telephone Encounter (Signed)
Called patient but patient's phone was not available and not able to leave a voicemail.

## 2022-05-07 NOTE — Telephone Encounter (Signed)
-----   Message from Wyline Mood, MD sent at 05/07/2022  8:10 AM EDT ----- LFT very minimally elevated - continue advise given at office visit

## 2022-05-07 NOTE — Progress Notes (Signed)
Ck normal

## 2022-05-07 NOTE — Progress Notes (Signed)
LFT very minimally elevated - continue advise given at office visit

## 2022-05-10 ENCOUNTER — Ambulatory Visit: Payer: Commercial Managed Care - PPO | Admitting: Physician Assistant

## 2022-05-13 ENCOUNTER — Encounter: Payer: Self-pay | Admitting: Physician Assistant

## 2022-05-13 ENCOUNTER — Ambulatory Visit: Payer: Commercial Managed Care - PPO | Admitting: Physician Assistant

## 2022-05-13 DIAGNOSIS — R7989 Other specified abnormal findings of blood chemistry: Secondary | ICD-10-CM

## 2022-05-13 DIAGNOSIS — R7301 Impaired fasting glucose: Secondary | ICD-10-CM

## 2022-05-13 DIAGNOSIS — F411 Generalized anxiety disorder: Secondary | ICD-10-CM

## 2022-05-13 DIAGNOSIS — I10 Essential (primary) hypertension: Secondary | ICD-10-CM | POA: Diagnosis not present

## 2022-05-13 LAB — POCT GLYCOSYLATED HEMOGLOBIN (HGB A1C): Hemoglobin A1C: 5.5 % (ref 4.0–5.6)

## 2022-05-13 MED ORDER — DILTIAZEM HCL ER COATED BEADS 120 MG PO CP24: ORAL_CAPSULE | ORAL | 1 refills | Status: DC

## 2022-05-13 MED ORDER — TRIAMTERENE-HCTZ 37.5-25 MG PO TABS: 1.0000 | ORAL_TABLET | Freq: Every day | ORAL | 3 refills | Status: DC

## 2022-05-13 MED ORDER — ESCITALOPRAM OXALATE 10 MG PO TABS: 10.0000 mg | ORAL_TABLET | Freq: Every day | ORAL | 2 refills | Status: DC

## 2022-05-13 NOTE — Progress Notes (Signed)
[facility] [place], [place] [address]  Internal MEDICINE  Office Visit Note  Patient Name: Pam Khan  [id number]  IV:[id number]  Date of Service: 05/13/2022  Chief Complaint  Patient presents with   Follow-up   Gastroesophageal Reflux   Hypertension    HPI Pt is here for routine follow up -She is being followed by GI for elevated LFTs now. Is getting hep B vaccine now. Plans to have EGD to see if she has barrett's esophagus. She has only been taking omeprazole as needed -She had an elevated glucose on last labs therfore an A1c will be checked in office today for further evaluation -Taking Lexparo daily now, has an alarm to remind her. She does still have some anxiety and is ready to increase to 10mg . She may take 2 tabs of her current 5mg  tabs, but will go ahead and send 10mg  dose  -BP not checked at home and will start doing so now as it is still elevated on recheck in office.  -recheck 152/84, 4 week follow up and consider med change at that time if elevated. She does also have some swelling in ankles at times. May consider updated echo in future. Pt reports hx of MVP but I cannot see echo record in system and will look for this  Current Medication: Outpatient Encounter Medications as of 05/13/2022  Medication Sig   albuterol (VENTOLIN HFA) 108 (90 Base) MCG/ACT inhaler Inhale 2 puffs into the lungs every 6 (six) hours as needed for wheezing or shortness of breath.   escitalopram (LEXAPRO) 10 MG tablet Take 1 tablet (10 mg total) by mouth daily.   famciclovir (FAMVIR) 500 MG tablet Take 1 tablet by mouth 2 (two) times daily.   fluconazole (DIFLUCAN) 150 MG tablet Take 1 tablet by mouth once.   omeprazole (PRILOSEC) 40 MG capsule TAKE ONE CAPSULE BY MOUTH EVERY DAY FOR HEART BURN   [DISCONTINUED] diltiazem (CARDIZEM CD) 120 MG 24 hr capsule Take 1 capsule by mouth at bedtime for heart rate   [DISCONTINUED] escitalopram (LEXAPRO) 5 MG tablet TAKE 1 TABLET (5  MG TOTAL) BY MOUTH DAILY.   [DISCONTINUED] triamterene-hydrochlorothiazide (MAXZIDE-25) 37.5-25 MG tablet Take 1 tablet by mouth daily.   diltiazem (CARDIZEM CD) 120 MG 24 hr capsule Take 1 capsule by mouth at bedtime for heart rate   triamterene-hydrochlorothiazide (MAXZIDE-25) 37.5-25 MG tablet Take 1 tablet by mouth daily.   No facility-administered encounter medications on file as of 05/13/2022.    Surgical History: Past Surgical History:  Procedure Laterality Date   [place]   head   CYSTOSCOPY WITH STENT PLACEMENT Left 05/17/2019   Procedure: CYSTOSCOPY WITH STENT PLACEMENT;  Surgeon: Hollice Espy, MD;  Location: [facility] ORS;  Service: Urology;  Laterality: Left;   CYSTOSCOPY/URETEROSCOPY/HOLMIUM LASER/STENT PLACEMENT Left 05/25/2019   Procedure: CYSTOSCOPY/URETEROSCOPY/STENT Exchange;  Surgeon: Hollice Espy, MD;  Location: [facility] ORS;  Service: Urology;  Laterality: Left;   DILATION AND CURETTAGE OF UTERUS  2007    Medical History: Past Medical History:  Diagnosis Date   Anxiety    Asthma    Barrett esophagus    DDD (degenerative disc disease), lumbar    Family history of adverse reaction to anesthesia    FATHER ALWAYS TROUBLE WAKING UP. WITH NECK SURGERY 2008/ASPIRATED EVENTUALLY DIED PNEUMONIA ETC   GERD (gastroesophageal reflux disease)    Hypertension    Spinal stenosis of lumbar region  Family History: Family History  Problem Relation Age of Onset   Breast cancer Mother 76   Breast cancer Maternal Grandmother 18   Diabetes Father    Heart attack Father     Social History   Socioeconomic History   Marital status: Married    Spouse name: Not on file   Number of children: Not on file   Years of education: Not on file   Highest education level: Not on file  Occupational History   Not on file  Tobacco Use   Smoking status: Never   Smokeless tobacco: Never  Vaping Use   Vaping Use: Never used   Substance and Sexual Activity   Alcohol use: Yes    Comment: occasionally   Drug use: No   Sexual activity: Not on file  Other Topics Concern   Not on file  Social History Narrative   Not on file   Social Determinants of Health   Financial Resource Strain: Not on file  Food Insecurity: Not on file  Transportation Needs: Not on file  Physical Activity: Not on file  Stress: Not on file  Social Connections: Not on file  Intimate Partner Violence: Not on file      Review of Systems  Constitutional:  Negative for chills, fatigue and unexpected weight change.  HENT:  Negative for congestion, postnasal drip, rhinorrhea, sneezing and sore throat.   Eyes:  Negative for redness.  Respiratory:  Negative for cough, chest tightness and shortness of breath.   Cardiovascular:  Negative for chest pain and palpitations.  Gastrointestinal:  Negative for abdominal pain, constipation, diarrhea, nausea and vomiting.  Genitourinary:  Negative for dysuria and frequency.  Musculoskeletal:  Negative for arthralgias, back pain, joint swelling and neck pain.  Skin:  Negative for rash.  Neurological: Negative.  Negative for tremors and numbness.  Hematological:  Negative for adenopathy. Does not bruise/bleed easily.  Psychiatric/Behavioral:  Negative for behavioral problems (Depression), sleep disturbance and suicidal ideas. The patient is nervous/anxious.     Vital Signs: BP (!) 162/85   Pulse 73   Temp 97.6 F (36.4 C)   Resp 16   Ht 5\' 6"  (1.676 m)   Wt 161 lb (73 kg)   SpO2 98%   BMI 25.99 kg/m    Physical Exam Vitals and nursing note reviewed.  Constitutional:      General: She is not in acute distress.    Appearance: She is well-developed and normal weight. She is not diaphoretic.  HENT:     Head: Normocephalic and atraumatic.     Mouth/Throat:     Pharynx: No oropharyngeal exudate.  Eyes:     Pupils: Pupils are equal, round, and reactive to light.  Neck:     Thyroid: No  thyromegaly.     Vascular: No JVD.     Trachea: No tracheal deviation.  Cardiovascular:     Rate and Rhythm: Normal rate and regular rhythm.     Heart sounds: Normal heart sounds. No murmur heard.    No friction rub. No gallop.  Pulmonary:     Effort: Pulmonary effort is normal. No respiratory distress.     Breath sounds: No wheezing or rales.  Chest:     Chest wall: No tenderness.  Abdominal:     General: Bowel sounds are normal.     Palpations: Abdomen is soft.  Musculoskeletal:        General: Normal range of motion.     Cervical back: Normal range of  motion and neck supple.  Lymphadenopathy:     Cervical: No cervical adenopathy.  Skin:    General: Skin is warm and dry.  Neurological:     Mental Status: She is alert and oriented to person, place, and time.     Cranial Nerves: No cranial nerve deficit.  Psychiatric:        Behavior: Behavior normal.        Thought Content: Thought content normal.        Judgment: Judgment normal.        Assessment/Plan: 1. Primary hypertension Continue current medications and will start monitoring at home. Will have follow up in 4 weeks to recheck. If elevated again may need med change. May also need to consider echo - diltiazem (CARDIZEM CD) 120 MG 24 hr capsule; Take 1 capsule by mouth at bedtime for heart rate  Dispense: 90 capsule; Refill: 1 - triamterene-hydrochlorothiazide (MAXZIDE-25) 37.5-25 MG tablet; Take 1 tablet by mouth daily.  Dispense: 90 tablet; Refill: 3  2. GAD (generalized anxiety disorder) Will increase to 10mg  Lexapro daily--may take 2 of her current 5mg  tabs, but will go ahead and send higher dose - escitalopram (LEXAPRO) 10 MG tablet; Take 1 tablet (10 mg total) by mouth daily.  Dispense: 30 tablet; Refill: 2  3. Impaired fasting glucose - POCT HgB A1C is 5.5 and normal. Will continue to monitor  4. Elevated LFTs Followed by GI   General Counseling: understanding of the findings of todays  visit and agrees with plan of treatment. I have discussed any further diagnostic evaluation that may be needed or ordered today. We also reviewed her medications today. she has been encouraged to call the office with any questions or concerns that should arise related to todays visit.    Orders Placed This Encounter  Procedures   POCT HgB A1C    Meds ordered this encounter  Medications   escitalopram (LEXAPRO) 10 MG tablet    Sig: Take 1 tablet (10 mg total) by mouth daily.    Dispense:  30 tablet    Refill:  2   diltiazem (CARDIZEM CD) 120 MG 24 hr capsule    Sig: Take 1 capsule by mouth at bedtime for heart rate    Dispense:  90 capsule    Refill:  1   triamterene-hydrochlorothiazide (MAXZIDE-25) 37.5-25 MG tablet    Sig: Take 1 tablet by mouth daily.    Dispense:  90 tablet    Refill:  3    This patient was seen by Signa Kell, PA-C in collaboration with Dr. 08-14-1992 as a part of collaborative care agreement.   Total time spent:30 Minutes Time spent includes review of chart, medications, test results, and follow up plan with the patient.      Dr Lynn Ito Internal medicine

## 2022-05-14 NOTE — Telephone Encounter (Signed)
Called patient again and was not able to leave her a voicemail. 

## 2022-05-27 ENCOUNTER — Other Ambulatory Visit: Payer: Self-pay | Admitting: Physician Assistant

## 2022-05-27 DIAGNOSIS — F411 Generalized anxiety disorder: Secondary | ICD-10-CM

## 2022-06-07 ENCOUNTER — Ambulatory Visit (INDEPENDENT_AMBULATORY_CARE_PROVIDER_SITE_OTHER): Payer: Commercial Managed Care - PPO | Admitting: Gastroenterology

## 2022-06-07 DIAGNOSIS — Z23 Encounter for immunization: Secondary | ICD-10-CM

## 2022-06-07 NOTE — Progress Notes (Unsigned)
Patient came in to get her second Hepatitis A vaccine (Havrix). Patient received the vaccine on her left deltoid. Patient tolerated well and had no concerns.-M. Balin Vandegrift, CMA

## 2022-06-10 ENCOUNTER — Ambulatory Visit: Payer: Commercial Managed Care - PPO | Admitting: Physician Assistant

## 2022-07-09 ENCOUNTER — Other Ambulatory Visit: Payer: Self-pay | Admitting: Physician Assistant

## 2022-07-09 DIAGNOSIS — B001 Herpesviral vesicular dermatitis: Secondary | ICD-10-CM

## 2022-07-09 DIAGNOSIS — K219 Gastro-esophageal reflux disease without esophagitis: Secondary | ICD-10-CM

## 2022-07-15 ENCOUNTER — Encounter: Payer: Commercial Managed Care - PPO | Admitting: Physician Assistant

## 2022-07-26 ENCOUNTER — Telehealth: Payer: Self-pay | Admitting: Gastroenterology

## 2022-07-26 NOTE — Telephone Encounter (Signed)
Patient called to cancel and reschedule upper endoscopy. Requesting call back.

## 2022-07-26 NOTE — Telephone Encounter (Signed)
Called patient back and I was not able to leave her a voicemail to call me back. I will call her later on.

## 2022-07-26 NOTE — Telephone Encounter (Signed)
Called patient back and she did not answer my call and I was not

## 2022-07-28 ENCOUNTER — Ambulatory Visit
Admission: RE | Admit: 2022-07-28 | Payer: Commercial Managed Care - PPO | Source: Home / Self Care | Admitting: Gastroenterology

## 2022-07-28 ENCOUNTER — Encounter: Admission: RE | Payer: Self-pay | Source: Home / Self Care

## 2022-07-28 SURGERY — ESOPHAGOGASTRODUODENOSCOPY (EGD) WITH PROPOFOL
Anesthesia: General

## 2022-07-28 NOTE — Telephone Encounter (Signed)
Called patient and she did not answer again. I was not able to leave her a voicemail. I will send her a letter to each out to [place] again to reschedule her procedure.

## 2022-08-02 ENCOUNTER — Encounter: Payer: Self-pay | Admitting: Physician Assistant

## 2022-08-02 ENCOUNTER — Ambulatory Visit (INDEPENDENT_AMBULATORY_CARE_PROVIDER_SITE_OTHER): Payer: Commercial Managed Care - PPO | Admitting: Physician Assistant

## 2022-08-02 VITALS — BP 149/82 | HR 86 | Temp 98.0°F | Resp 16 | Ht 66.0 in | Wt 164.2 lb

## 2022-08-02 DIAGNOSIS — R7989 Other specified abnormal findings of blood chemistry: Secondary | ICD-10-CM | POA: Diagnosis not present

## 2022-08-02 DIAGNOSIS — Z0001 Encounter for general adult medical examination with abnormal findings: Secondary | ICD-10-CM | POA: Diagnosis not present

## 2022-08-02 DIAGNOSIS — R300 Dysuria: Secondary | ICD-10-CM

## 2022-08-02 DIAGNOSIS — F411 Generalized anxiety disorder: Secondary | ICD-10-CM

## 2022-08-02 DIAGNOSIS — I10 Essential (primary) hypertension: Secondary | ICD-10-CM

## 2022-08-02 MED ORDER — DILTIAZEM HCL ER 180 MG PO TB24: 180.0000 mg | ORAL_TABLET | Freq: Every day | ORAL | 2 refills | Status: DC

## 2022-08-02 NOTE — Progress Notes (Signed)
[facility] [place], [place] [address]  Internal MEDICINE  Office Visit Note  Patient Name: Pam Khan  [id number]  [id number]  Date of Service: 08/02/2022  Chief Complaint  Patient presents with   Annual Exam   Gastroesophageal Reflux   Hypertension     HPI Pt is here for routine health maintenance examination with no new concerns today -Doing well on 10mg  Lexapro and would like to continue -BP 140s/70s at home -BP did not improve on recheck, therefore will increase dose of diltiazem and recheck in 4 weeks. -Did have vaccine for hep A with GI. Had planned to do EGD to check if patient actually has barrett's esophagus, but cost was prohibitive at this time. May reconsider in future. They are following her elevated LFT's. -form for work proving CPE done was signed  -She is up to date on PHM and already had labs done this year -declines breast exam  Current Medication: Outpatient Encounter Medications as of 08/02/2022  Medication Sig   albuterol (VENTOLIN HFA) 108 (90 Base) MCG/ACT inhaler Inhale 2 puffs into the lungs every 6 (six) hours as needed for wheezing or shortness of breath.   diltiazem (CARDIZEM LA) 180 MG 24 hr tablet Take 1 tablet (180 mg total) by mouth daily.   escitalopram (LEXAPRO) 10 MG tablet TAKE 1 TABLET BY MOUTH EVERY DAY   famciclovir (FAMVIR) 500 MG tablet TAKE 1 TABLET BY MOUTH TWICE A DAY   fluconazole (DIFLUCAN) 150 MG tablet Take 1 tablet by mouth once.   omeprazole (PRILOSEC) 40 MG capsule TAKE ONE CAPSULE BY MOUTH EVERY DAY FOR HEART BURN   triamterene-hydrochlorothiazide (MAXZIDE-25) 37.5-25 MG tablet Take 1 tablet by mouth daily.   [DISCONTINUED] diltiazem (CARDIZEM CD) 120 MG 24 hr capsule Take 1 capsule by mouth at bedtime for heart rate   No facility-administered encounter medications on file as of 08/02/2022.    Surgical History: Past Surgical History:  Procedure Laterality Date   [place]   head   CYSTOSCOPY WITH STENT PLACEMENT Left 05/17/2019   Procedure: CYSTOSCOPY WITH STENT PLACEMENT;  Surgeon: Hollice Espy, MD;  Location: [facility];  Service: Urology;  Laterality: Left;   CYSTOSCOPY/URETEROSCOPY/HOLMIUM LASER/STENT PLACEMENT Left 05/25/2019   Procedure: CYSTOSCOPY/URETEROSCOPY/STENT Exchange;  Surgeon: Hollice Espy, MD;  Location: [facility];  Service: Urology;  Laterality: Left;   DILATION AND CURETTAGE OF UTERUS  2007    Medical History: Past Medical History:  Diagnosis Date   Anxiety    Asthma    Barrett esophagus    DDD (degenerative disc disease), lumbar    Family history of adverse reaction to anesthesia    FATHER ALWAYS TROUBLE WAKING UP. WITH NECK SURGERY 2008/ASPIRATED EVENTUALLY DIED PNEUMONIA ETC   GERD (gastroesophageal reflux disease)    Hypertension    Spinal stenosis of lumbar region     Family History: Family History  Problem Relation Age of Onset   Breast cancer Mother 62   Breast cancer Maternal Grandmother 61   Diabetes Father    Heart attack Father       Review of Systems  Constitutional:  Negative for chills, fatigue and unexpected weight change.  HENT:  Negative for congestion, postnasal drip, rhinorrhea, sneezing and sore throat.   Eyes:  Negative for redness.  Respiratory:  Negative for cough, chest tightness and shortness of breath.   Cardiovascular:  Negative for chest pain and palpitations.  Gastrointestinal:  Negative for abdominal pain, constipation, diarrhea, nausea and vomiting.  Genitourinary:  Negative for dysuria and frequency.  Musculoskeletal:  Negative for arthralgias, back pain, joint swelling and neck pain.  Skin:  Negative for rash.  Neurological: Negative.  Negative for tremors and numbness.  Hematological:  Negative for adenopathy. Does not bruise/bleed easily.  Psychiatric/Behavioral:  Negative for behavioral problems (Depression), sleep disturbance and  suicidal ideas. The patient is nervous/anxious.      Vital Signs: BP (!) 149/82   Pulse 86   Temp 98 F (36.7 C)   Resp 16   Ht 5\' 6"  (1.676 m)   Wt 164 lb 3.2 oz (74.5 kg)   SpO2 98%   BMI 26.50 kg/m    Physical Exam Vitals and nursing note reviewed.  Constitutional:      General: She is not in acute distress.    Appearance: She is well-developed and normal weight. She is not diaphoretic.  HENT:     Head: Normocephalic and atraumatic.     Mouth/Throat:     Pharynx: No oropharyngeal exudate.  Eyes:     Pupils: Pupils are equal, round, and reactive to light.  Neck:     Thyroid: No thyromegaly.     Vascular: No JVD.     Trachea: No tracheal deviation.  Cardiovascular:     Rate and Rhythm: Normal rate and regular rhythm.     Heart sounds: Normal heart sounds. No murmur heard.    No friction rub. No gallop.  Pulmonary:     Effort: Pulmonary effort is normal. No respiratory distress.     Breath sounds: No wheezing or rales.  Chest:     Chest wall: No tenderness.  Abdominal:     General: Bowel sounds are normal.     Palpations: Abdomen is soft.     Tenderness: There is no abdominal tenderness.  Musculoskeletal:        General: Normal range of motion.     Cervical back: Normal range of motion and neck supple.  Lymphadenopathy:     Cervical: No cervical adenopathy.  Skin:    General: Skin is warm and dry.  Neurological:     Mental Status: She is alert and oriented to person, place, and time.     Cranial Nerves: No cranial nerve deficit.  Psychiatric:        Behavior: Behavior normal.        Thought Content: Thought content normal.        Judgment: Judgment normal.      LABS: Recent Results (from the past 2160 hour(s))  CK     Status: None   Collection Time: 05/06/22  1:32 PM  Result Value Ref Range   Total CK 103 32 - 182 U/L  Hepatic function panel     Status: Abnormal   Collection Time: 05/06/22  1:32 PM  Result Value Ref Range   Total Protein 7.3 6.0  - 8.5 g/dL   Albumin 4.9 3.8 - 4.9 g/dL    Comment:                **Effective May 17, 2022 Albumin reference interval**                  will be changing to:                             Age  Female          Female                            0 -   7 days       3.6 - 4.9      3.6 - 4.9                            8 -  30 days       3.5 - 4.6      3.5 - 4.6                            1 -   6 months     3.7 - 4.8      3.7 - 4.8                     7 months -   2 years      4.0 - 5.0      4.0 - 5.0                            3 -   5 years      4.1 - 5.0      4.1 - 5.0                            6 -  12 years      4.2 - 5.0      4.2 - 5.0                           13 -  30 years      4.3 - 5.2      4.0 - 5.0                           31 -  50 years      4.1 - 5.1      3.9 - 4.9                           51 -  60 years      3.8 - 4.9      3.8 - 4.9                           61 -  70 years      3.9 - 4.9      3.9 - 4.9                           71 -  80 years      3.8 - 4.8      3.8 - 4._0 -  89 years      3.7 - 4.7      3.7 - 4.7  90 - 199 years      3.6 - 4.6      3.6 - 4.6    Bilirubin Total 0.6 0.0 - 1.2 mg/dL   Bilirubin, Direct 0.18 0.00 - 0.40 mg/dL   Alkaline Phosphatase 100 44 - 121 IU/L   AST 38 0 - 40 IU/L   ALT 59 (H) 0 - 32 IU/L  Gamma GT     Status: None   Collection Time: 05/06/22  1:32 PM  Result Value Ref Range   GGT 34 0 - 60 IU/L  POCT HgB A1C     Status: None   Collection Time: 05/13/22  9:54 AM  Result Value Ref Range   Hemoglobin A1C 5.5 4.0 - 5.6 %   HbA1c POC (<> result, manual entry)     HbA1c, POC (prediabetic range)     HbA1c, POC (controlled diabetic range)          Assessment/Plan: 1. Encounter for general adult medical examination with abnormal findings CPE performed, UTD on PHM  2. Primary hypertension Elevated in office, will increase diltiazem and continue triamterene-hctz as  before. Will monitor at home and follow up in 4 weeks. - diltiazem (CARDIZEM LA) 180 MG 24 hr tablet; Take 1 tablet (180 mg total) by mouth daily.  Dispense: 30 tablet; Refill: 2  3. GAD (generalized anxiety disorder) Continue lexapro as before  4. Elevated LFTs Followed by GI  5. Dysuria - UA/M w/rflx Culture, Routine   General Counseling: Markham Jordan understanding of the findings of todays visit and agrees with plan of treatment. I have discussed any further diagnostic evaluation that may be needed or ordered today. We also reviewed her medications today. she has been encouraged to call the office with any questions or concerns that should arise related to todays visit.    Counseling:    Orders Placed This Encounter  Procedures   UA/M w/rflx Culture, Routine    Meds ordered this encounter  Medications   diltiazem (CARDIZEM LA) 180 MG 24 hr tablet    Sig: Take 1 tablet (180 mg total) by mouth daily.    Dispense:  30 tablet    Refill:  2    This patient was seen by Drema Dallas, PA-C in collaboration with Dr. Clayborn Bigness as a part of collaborative care agreement.  Total time spent:35 Minutes  Time spent includes review of chart, medications, test results, and follow up plan with the patient.     Lavera Guise, MD  Internal Medicine

## 2022-08-03 LAB — UA/M W/RFLX CULTURE, ROUTINE
Bilirubin, UA: NEGATIVE
Glucose, UA: NEGATIVE
Ketones, UA: NEGATIVE
Leukocytes,UA: NEGATIVE
Nitrite, UA: NEGATIVE
Protein,UA: NEGATIVE
RBC, UA: NEGATIVE
Specific Gravity, UA: 1.024 (ref 1.005–1.030)
Urobilinogen, Ur: 1 mg/dL (ref 0.2–1.0)
pH, UA: 5.5 (ref 5.0–7.5)

## 2022-08-03 LAB — MICROSCOPIC EXAMINATION
Casts: NONE SEEN /lpf
RBC, Urine: NONE SEEN /hpf (ref 0–2)

## 2022-08-30 ENCOUNTER — Ambulatory Visit: Payer: Commercial Managed Care - PPO | Admitting: Physician Assistant

## 2022-09-13 ENCOUNTER — Ambulatory Visit: Payer: Commercial Managed Care - PPO | Admitting: Physician Assistant

## 2022-09-13 ENCOUNTER — Encounter: Payer: Self-pay | Admitting: Physician Assistant

## 2022-09-13 VITALS — BP 164/80 | HR 80 | Temp 98.4°F | Resp 16 | Ht 66.0 in | Wt 168.4 lb

## 2022-09-13 DIAGNOSIS — I10 Essential (primary) hypertension: Secondary | ICD-10-CM | POA: Diagnosis not present

## 2022-09-13 DIAGNOSIS — F411 Generalized anxiety disorder: Secondary | ICD-10-CM | POA: Diagnosis not present

## 2022-09-13 MED ORDER — ESCITALOPRAM OXALATE 20 MG PO TABS: 20.0000 mg | ORAL_TABLET | Freq: Every day | ORAL | 1 refills | Status: DC

## 2022-09-13 NOTE — Progress Notes (Signed)
[facility] [address] [place], [place] [address]  Internal MEDICINE  Office Visit Note  Patient Name: Pam Khan  [id number]  [id number]  Date of Service: 09/13/2022  Chief Complaint  Patient presents with   Follow-up   Gastroesophageal Reflux   Hypertension   Arm Swelling    Place near armpit that has a lump, unable to spot it today, but has been there prior to today's visit    HPI Pt is here for routine follow up -BP at home 120s/80s, takes her medication at night. This is improved since increasing her diltiazem. Previously was getting 140s systolic which was on par with office readings. Therefore given home readings have been so improved, today's reading is likely due to being upset prior to coming into office. Reports she just hung up the phone with one of her friends talking about parent health and both were crying on the phone together which has her BP up now. It remained up on recheck. -She will monitor closely -Had a small lump in left armpit. It was a little tender, hard marble-like feeling to it. It is not present today. She will call if this comes back and [place] may be warranted. No rash or redness. -Taking 2 of her 10mg  Lexapro, therefore will send higher script now.  Current Medication: Outpatient Encounter Medications as of 09/13/2022  Medication Sig   albuterol (VENTOLIN HFA) 108 (90 Base) MCG/ACT inhaler Inhale 2 puffs into the lungs every 6 (six) hours as needed for wheezing or shortness of breath.   diltiazem (CARDIZEM LA) 180 MG 24 hr tablet Take 1 tablet (180 mg total) by mouth daily.   escitalopram (LEXAPRO) 20 MG tablet Take 1 tablet (20 mg total) by mouth daily.   famciclovir (FAMVIR) 500 MG tablet TAKE 1 TABLET BY MOUTH TWICE A DAY   fluconazole (DIFLUCAN) 150 MG tablet Take 1 tablet by mouth once.   omeprazole (PRILOSEC) 40 MG capsule TAKE ONE CAPSULE BY MOUTH EVERY DAY FOR HEART BURN   triamterene-hydrochlorothiazide (MAXZIDE-25) 37.5-25 MG  tablet Take 1 tablet by mouth daily.   [DISCONTINUED] escitalopram (LEXAPRO) 10 MG tablet TAKE 1 TABLET BY MOUTH EVERY DAY   No facility-administered encounter medications on file as of 09/13/2022.    Surgical History: Past Surgical History:  Procedure Laterality Date   BACK SURGERY  1985   CESAREAN SECTION  1989   COSMETIC SURGERY  1985   head   CYSTOSCOPY WITH STENT PLACEMENT Left 05/17/2019   Procedure: CYSTOSCOPY WITH STENT PLACEMENT;  Surgeon: 07/18/2019, MD;  Location: [facility];  Service: Urology;  Laterality: Left;   CYSTOSCOPY/URETEROSCOPY/HOLMIUM LASER/STENT PLACEMENT Left 05/25/2019   Procedure: CYSTOSCOPY/URETEROSCOPY/STENT Exchange;  Surgeon: 05/27/2019, MD;  Location: [facility];  Service: Urology;  Laterality: Left;   DILATION AND CURETTAGE OF UTERUS  2007    Medical History: Past Medical History:  Diagnosis Date   Anxiety    Asthma    Barrett esophagus    DDD (degenerative disc disease), lumbar    Family history of adverse reaction to anesthesia    FATHER ALWAYS TROUBLE WAKING UP. WITH NECK SURGERY 2008/ASPIRATED EVENTUALLY DIED PNEUMONIA ETC   GERD (gastroesophageal reflux disease)    Hypertension    Spinal stenosis of lumbar region     Family History: Family History  Problem Relation Age of Onset   Breast cancer Mother 23   Breast cancer Maternal Grandmother 59   Diabetes Father    Heart attack Father  Social History   Socioeconomic History   Marital status: Married    Spouse name: Not on file   Number of children: Not on file   Years of education: Not on file   Highest education level: Not on file  Occupational History   Not on file  Tobacco Use   Smoking status: Never   Smokeless tobacco: Never  Vaping Use   Vaping Use: Never used  Substance and Sexual Activity   Alcohol use: Yes    Comment: occasionally   Drug use: No   Sexual activity: Not on file  Other Topics Concern   Not on file  Social History Narrative   Not on file    Social Determinants of Health   Financial Resource Strain: Not on file  Food Insecurity: Not on file  Transportation Needs: Not on file  Physical Activity: Not on file  Stress: Not on file  Social Connections: Not on file  Intimate Partner Violence: Not on file      Review of Systems  Constitutional:  Negative for chills, fatigue and unexpected weight change.  HENT:  Negative for congestion, postnasal drip, rhinorrhea, sneezing and sore throat.   Eyes:  Negative for redness.  Respiratory:  Negative for cough, chest tightness and shortness of breath.   Cardiovascular:  Negative for chest pain and palpitations.  Gastrointestinal:  Negative for abdominal pain, constipation, diarrhea, nausea and vomiting.  Genitourinary:  Negative for dysuria and frequency.  Musculoskeletal:  Negative for arthralgias, back pain, joint swelling and neck pain.  Skin:  Negative for rash.  Neurological: Negative.  Negative for tremors and numbness.  Hematological:  Negative for adenopathy. Does not bruise/bleed easily.  Psychiatric/Behavioral:  Negative for behavioral problems (Depression), sleep disturbance and suicidal ideas. The patient is nervous/anxious.     Vital Signs: BP (!) 164/80   Pulse 80   Temp 98.4 F (36.9 C)   Resp 16   Ht 5\' 6"  (1.676 m)   Wt 168 lb 6.4 oz (76.4 kg)   SpO2 95%   BMI 27.18 kg/m    Physical Exam Vitals and nursing note reviewed.  Constitutional:      General: She is not in acute distress.    Appearance: She is well-developed and normal weight.  HENT:     Head: Normocephalic and atraumatic.     Mouth/Throat:     Pharynx: No oropharyngeal exudate.  Eyes:     Pupils: Pupils are equal, round, and reactive to light.  Neck:     Thyroid: No thyromegaly.     Vascular: No JVD.     Trachea: No tracheal deviation.  Cardiovascular:     Rate and Rhythm: Normal rate and regular rhythm.     Heart sounds: Normal heart sounds. No murmur heard.    No friction rub.  No gallop.  Pulmonary:     Effort: Pulmonary effort is normal.  Abdominal:     General: Bowel sounds are normal.     Palpations: Abdomen is soft.  Musculoskeletal:        General: Normal range of motion.     Cervical back: Normal range of motion and neck supple.  Lymphadenopathy:     Cervical: No cervical adenopathy.     Upper Body:     Left upper body: No axillary adenopathy.  Skin:    General: Skin is warm and dry.  Neurological:     Mental Status: She is alert and oriented to person, place, and time.  Psychiatric:  Behavior: Behavior normal.        Thought Content: Thought content normal.        Judgment: Judgment normal.        Assessment/Plan: 1. Essential hypertension Elevated in office due to upsetting conversation prior to visit, otherwise has been very well controlled at home with last med change and will continue current medications and monitoring.  2. GAD (generalized anxiety disorder) - escitalopram (LEXAPRO) 20 MG tablet; Take 1 tablet (20 mg total) by mouth daily.  Dispense: 90 tablet; Refill: 1   General Counseling: Marvetta verbalizes understanding of the findings of todays visit and agrees with plan of treatment. I have discussed any further diagnostic evaluation that may be needed or ordered today. We also reviewed her medications today. she has been encouraged to call the office with any questions or concerns that should arise related to todays visit.    No orders of the defined types were placed in this encounter.   Meds ordered this encounter  Medications   escitalopram (LEXAPRO) 20 MG tablet    Sig: Take 1 tablet (20 mg total) by mouth daily.    Dispense:  90 tablet    Refill:  1    This patient was seen by Drema Dallas, PA-C in collaboration with Dr. Clayborn Bigness as a part of collaborative care agreement.   Total time spent:30 Minutes Time spent includes review of chart, medications, test results, and follow up plan with the patient.       Dr Lavera Guise Internal medicine

## 2022-10-30 ENCOUNTER — Other Ambulatory Visit: Payer: Self-pay | Admitting: Physician Assistant

## 2022-10-30 DIAGNOSIS — I10 Essential (primary) hypertension: Secondary | ICD-10-CM

## 2022-11-02 ENCOUNTER — Other Ambulatory Visit: Payer: Self-pay

## 2022-11-02 DIAGNOSIS — I10 Essential (primary) hypertension: Secondary | ICD-10-CM

## 2022-11-02 MED ORDER — DILTIAZEM HCL ER 180 MG PO TB24: 180.0000 mg | ORAL_TABLET | Freq: Every day | ORAL | 2 refills | Status: DC

## 2022-11-04 ENCOUNTER — Ambulatory Visit (INDEPENDENT_AMBULATORY_CARE_PROVIDER_SITE_OTHER): Payer: Commercial Managed Care - PPO | Admitting: Gastroenterology

## 2022-11-04 ENCOUNTER — Ambulatory Visit: Payer: Commercial Managed Care - PPO | Admitting: Gastroenterology

## 2022-11-04 DIAGNOSIS — Z23 Encounter for immunization: Secondary | ICD-10-CM

## 2022-11-17 ENCOUNTER — Other Ambulatory Visit: Payer: Self-pay | Admitting: Nurse Practitioner

## 2022-12-21 ENCOUNTER — Other Ambulatory Visit: Payer: Self-pay | Admitting: Nurse Practitioner

## 2023-01-06 ENCOUNTER — Other Ambulatory Visit: Payer: Self-pay | Admitting: Nurse Practitioner

## 2023-01-06 DIAGNOSIS — I10 Essential (primary) hypertension: Secondary | ICD-10-CM

## 2023-01-10 ENCOUNTER — Ambulatory Visit: Payer: Commercial Managed Care - PPO | Admitting: Physician Assistant

## 2023-01-14 ENCOUNTER — Ambulatory Visit
Admission: RE | Admit: 2023-01-14 | Discharge: 2023-01-14 | Disposition: A | Discharge status: Home / Self Care | Payer: Commercial Managed Care - PPO | Source: Ambulatory Visit | Attending: Physician Assistant | Admitting: Physician Assistant

## 2023-01-14 ENCOUNTER — Ambulatory Visit
Admission: RE | Admit: 2023-01-14 | Discharge: 2023-01-14 | Disposition: A | Discharge status: Home / Self Care | Payer: Commercial Managed Care - PPO | Attending: Physician Assistant | Admitting: Physician Assistant

## 2023-01-14 ENCOUNTER — Ambulatory Visit (INDEPENDENT_AMBULATORY_CARE_PROVIDER_SITE_OTHER): Payer: Commercial Managed Care - PPO | Admitting: Physician Assistant

## 2023-01-14 ENCOUNTER — Encounter: Payer: Self-pay | Admitting: Physician Assistant

## 2023-01-14 VITALS — BP 130/69 | HR 82 | Temp 98.3°F | Resp 16 | Ht 66.0 in | Wt 169.8 lb

## 2023-01-14 DIAGNOSIS — F411 Generalized anxiety disorder: Secondary | ICD-10-CM

## 2023-01-14 DIAGNOSIS — M25551 Pain in right hip: Secondary | ICD-10-CM | POA: Diagnosis not present

## 2023-01-14 DIAGNOSIS — M545 Low back pain, unspecified: Secondary | ICD-10-CM | POA: Insufficient documentation

## 2023-01-14 DIAGNOSIS — I10 Essential (primary) hypertension: Secondary | ICD-10-CM | POA: Diagnosis not present

## 2023-01-14 DIAGNOSIS — G8929 Other chronic pain: Secondary | ICD-10-CM | POA: Diagnosis present

## 2023-01-14 MED ORDER — FLUCONAZOLE 150 MG PO TABS: 150.0000 mg | ORAL_TABLET | Freq: Once | ORAL | 3 refills | Status: AC

## 2023-01-14 MED ORDER — CYCLOBENZAPRINE HCL 10 MG PO TABS: 10.0000 mg | ORAL_TABLET | Freq: Every day | ORAL | 1 refills | Status: DC

## 2023-01-14 NOTE — Progress Notes (Signed)
[facility] [place], [place] [address]  Internal MEDICINE  Office Visit Note  Patient Name: Pam Khan  [id number]  [id number]  Date of Service: 01/18/2023  Chief Complaint  Patient presents with   Follow-up   Hypertension   Gastroesophageal Reflux    HPI Pt is here for routine follow up -Takes diltiazem at night and takes maxzide mid morning -lexapro was helping but then forgot a few days and decided to continue without since she noticed some sexual side effects on the 20mg  dose. She has been crying more since then. She is going to start back with 10mg  lexapro. May add wellbutrin in future to help without causing as much sexual side effects as the higher lexparo dose was. -Does get yeast infection occasionally and requests to have refill available as needed -Does have rods in her back and was told no cushion remaining. Does have pressure when she sits has some pain. Right hip and pain and low back spasms. Will get some xrays and refer as needed. Will try muscle relaxer to see if this helps -has been exercising more which helps.   Current Medication: Outpatient Encounter Medications as of 01/14/2023  Medication Sig   albuterol (VENTOLIN HFA) 108 (90 Base) MCG/ACT inhaler Inhale 2 puffs into the lungs every 6 (six) hours as needed for wheezing or shortness of breath.   cyclobenzaprine (FLEXERIL) 10 MG tablet Take 1 tablet (10 mg total) by mouth at bedtime. Take one tab po qhs for back spasm prn only   diltiazem (CARDIZEM LA) 180 MG 24 hr tablet TAKE 1 TABLET BY MOUTH EVERY DAY   escitalopram (LEXAPRO) 20 MG tablet Take 1 tablet (20 mg total) by mouth daily.   famciclovir (FAMVIR) 500 MG tablet TAKE 1 TABLET BY MOUTH TWICE A DAY   omeprazole (PRILOSEC) 40 MG capsule TAKE ONE CAPSULE BY MOUTH EVERY DAY FOR HEART BURN   triamterene-hydrochlorothiazide (MAXZIDE-25) 37.5-25 MG tablet Take 1 tablet by mouth daily.   [DISCONTINUED] fluconazole (DIFLUCAN) 150 MG  tablet Take 1 tablet by mouth once.   [EXPIRED] fluconazole (DIFLUCAN) 150 MG tablet Take 1 tablet (150 mg total) by mouth once for 1 dose.   No facility-administered encounter medications on file as of 01/14/2023.    Surgical History: Past Surgical History:  Procedure Laterality Date   [place]   head   CYSTOSCOPY WITH STENT PLACEMENT Left 05/17/2019   Procedure: CYSTOSCOPY WITH STENT PLACEMENT;  Surgeon: Hollice Espy, MD;  Location: [facility];  Service: Urology;  Laterality: Left;   CYSTOSCOPY/URETEROSCOPY/HOLMIUM LASER/STENT PLACEMENT Left 05/25/2019   Procedure: CYSTOSCOPY/URETEROSCOPY/STENT Exchange;  Surgeon: Hollice Espy, MD;  Location: [facility];  Service: Urology;  Laterality: Left;   DILATION AND CURETTAGE OF UTERUS  2007    Medical History: Past Medical History:  Diagnosis Date   Anxiety    Asthma    Barrett esophagus    DDD (degenerative disc disease), lumbar    Family history of adverse reaction to anesthesia    FATHER ALWAYS TROUBLE WAKING UP. WITH NECK SURGERY 2008/ASPIRATED EVENTUALLY DIED PNEUMONIA ETC   GERD (gastroesophageal reflux disease)    Hypertension    Spinal stenosis of lumbar region     Family History: Family History  Problem Relation Age of Onset   Breast cancer Mother 17   Breast cancer Maternal Grandmother 26   Diabetes Father    Heart attack Father  Social History   Socioeconomic History   Marital status: Married    Spouse name: Not on file   Number of children: Not on file   Years of education: Not on file   Highest education level: Not on file  Occupational History   Not on file  Tobacco Use   Smoking status: Never   Smokeless tobacco: Never  Vaping Use   Vaping Use: Never used  Substance and Sexual Activity   Alcohol use: Yes    Comment: occasionally   Drug use: No   Sexual activity: Not on file  Other Topics Concern   Not on file  Social History Narrative    Not on file   Social Determinants of Health   Financial Resource Strain: Not on file  Food Insecurity: Not on file  Transportation Needs: Not on file  Physical Activity: Not on file  Stress: Not on file  Social Connections: Not on file  Intimate Partner Violence: Not on file      Review of Systems  Constitutional:  Negative for chills, fatigue and unexpected weight change.  HENT:  Negative for congestion, postnasal drip, rhinorrhea, sneezing and sore throat.   Eyes:  Negative for redness.  Respiratory:  Negative for cough, chest tightness and shortness of breath.   Cardiovascular:  Negative for chest pain and palpitations.  Gastrointestinal:  Negative for abdominal pain, constipation, diarrhea, nausea and vomiting.  Genitourinary:  Negative for dysuria and frequency.  Musculoskeletal:  Positive for arthralgias and back pain. Negative for joint swelling and neck pain.  Skin:  Negative for rash.  Neurological: Negative.  Negative for tremors and numbness.  Hematological:  Negative for adenopathy. Does not bruise/bleed easily.  Psychiatric/Behavioral:  Negative for behavioral problems (Depression), sleep disturbance and suicidal ideas. The patient is nervous/anxious.     Vital Signs: BP 130/69   Pulse 82   Temp 98.3 F (36.8 C)   Resp 16   Ht 5\' 6"  (1.676 m)   Wt 169 lb 12.8 oz (77 kg)   SpO2 98%   BMI 27.41 kg/m    Physical Exam Vitals and nursing note reviewed.  Constitutional:      General: She is not in acute distress.    Appearance: She is well-developed and normal weight.  HENT:     Head: Normocephalic and atraumatic.     Mouth/Throat:     Pharynx: No oropharyngeal exudate.  Eyes:     Pupils: Pupils are equal, round, and reactive to light.  Neck:     Thyroid: No thyromegaly.     Vascular: No JVD.     Trachea: No tracheal deviation.  Cardiovascular:     Rate and Rhythm: Normal rate and regular rhythm.     Heart sounds: Normal heart sounds. No murmur  heard.    No friction rub. No gallop.  Pulmonary:     Effort: Pulmonary effort is normal.  Abdominal:     General: Bowel sounds are normal.     Palpations: Abdomen is soft.  Musculoskeletal:        General: Normal range of motion.     Cervical back: Normal range of motion and neck supple.  Lymphadenopathy:     Cervical: No cervical adenopathy.     Upper Body:     Left upper body: No axillary adenopathy.  Skin:    General: Skin is warm and dry.  Neurological:     Mental Status: She is alert and oriented to person, place, and time.  Psychiatric:        Behavior: Behavior normal.        Thought Content: Thought content normal.        Judgment: Judgment normal.        Assessment/Plan: 1. Chronic right hip pain Will check xray - DG Hip Unilat W OR W/O Pelvis 2-3 Views Right; Future  2. Chronic bilateral low back pain without sciatica Will check xray and may take tylenol or flexeril as needed. May need ortho follow up pending results - DG Lumbar Spine Complete; Future - cyclobenzaprine (FLEXERIL) 10 MG tablet; Take 1 tablet (10 mg total) by mouth at bedtime. Take one tab po qhs for back spasm prn only  Dispense: 15 tablet; Refill: 1  3. Essential hypertension Stable, continue current medications  4. GAD (generalized anxiety disorder) Will restart 10mg  lexapro, may consider adding wellbutrin if needed rather than increase dose of lexapro further   General Counseling: Markham Jordan understanding of the findings of todays visit and agrees with plan of treatment. I have discussed any further diagnostic evaluation that may be needed or ordered today. We also reviewed her medications today. she has been encouraged to call the office with any questions or concerns that should arise related to todays visit.    Orders Placed This Encounter  Procedures   DG Hip Unilat W OR W/O Pelvis 2-3 Views Right   DG Lumbar Spine Complete    Meds ordered this encounter  Medications    fluconazole (DIFLUCAN) 150 MG tablet    Sig: Take 1 tablet (150 mg total) by mouth once for 1 dose.    Dispense:  1 tablet    Refill:  3   cyclobenzaprine (FLEXERIL) 10 MG tablet    Sig: Take 1 tablet (10 mg total) by mouth at bedtime. Take one tab po qhs for back spasm prn only    Dispense:  15 tablet    Refill:  1    This patient was seen by Drema Dallas, PA-C in collaboration with Dr. Clayborn Bigness as a part of collaborative care agreement.   Total time spent:30 Minutes Time spent includes review of chart, medications, test results, and follow up plan with the patient.      Dr Lavera Guise Internal medicine

## 2023-01-18 ENCOUNTER — Telehealth: Payer: Self-pay

## 2023-01-18 NOTE — Telephone Encounter (Signed)
Spoke with patient regarding x-ray results. Patient is okay with orthopedic referral.

## 2023-01-18 NOTE — Telephone Encounter (Signed)
-----   Message from Mylinda Latina, PA-C sent at 01/18/2023 11:29 AM EDT ----- Please let her know that her hip xray did not show any acute findings, but her lumbar xray did show postop changes at L1 with compression and posterior fusion, with degenerative changes L4-L5 and disc space narrowing. I would recommend she have follow up with ortho given worsening back pain recently. I can send new referral if she would like one.

## 2023-01-19 ENCOUNTER — Other Ambulatory Visit: Payer: Self-pay | Admitting: Physician Assistant

## 2023-01-19 ENCOUNTER — Telehealth: Payer: Self-pay | Admitting: Physician Assistant

## 2023-01-19 DIAGNOSIS — G8929 Other chronic pain: Secondary | ICD-10-CM

## 2023-01-19 DIAGNOSIS — Z9889 Other specified postprocedural states: Secondary | ICD-10-CM

## 2023-01-19 NOTE — Telephone Encounter (Signed)
Orthopedic referral sent via [facility] to [facility] 

## 2023-01-24 ENCOUNTER — Other Ambulatory Visit: Payer: Self-pay | Admitting: Physician Assistant

## 2023-01-24 DIAGNOSIS — G8929 Other chronic pain: Secondary | ICD-10-CM

## 2023-01-27 ENCOUNTER — Telehealth: Payer: Self-pay | Admitting: Physician Assistant

## 2023-01-27 NOTE — Telephone Encounter (Signed)
Per Rudene Christians,, referral has been closed due to patient not returning calls to schedule-[facility]

## 2023-02-03 NOTE — Telephone Encounter (Signed)
Done

## 2023-02-11 ENCOUNTER — Ambulatory Visit: Payer: Commercial Managed Care - PPO | Admitting: Physician Assistant

## 2023-04-03 ENCOUNTER — Other Ambulatory Visit: Payer: Self-pay | Admitting: Physician Assistant

## 2023-04-03 DIAGNOSIS — F411 Generalized anxiety disorder: Secondary | ICD-10-CM

## 2023-05-08 ENCOUNTER — Other Ambulatory Visit: Payer: Self-pay | Admitting: Physician Assistant

## 2023-05-08 DIAGNOSIS — I10 Essential (primary) hypertension: Secondary | ICD-10-CM

## 2023-05-29 ENCOUNTER — Other Ambulatory Visit: Payer: Self-pay | Admitting: Physician Assistant

## 2023-05-29 DIAGNOSIS — I10 Essential (primary) hypertension: Secondary | ICD-10-CM

## 2023-07-11 ENCOUNTER — Other Ambulatory Visit: Payer: Self-pay | Admitting: Physician Assistant

## 2023-07-11 DIAGNOSIS — B001 Herpesviral vesicular dermatitis: Secondary | ICD-10-CM

## 2023-07-20 ENCOUNTER — Ambulatory Visit: Payer: Commercial Managed Care - PPO | Admitting: Dermatology

## 2023-07-20 VITALS — BP 132/79 | HR 93

## 2023-07-20 DIAGNOSIS — W908XXA Exposure to other nonionizing radiation, initial encounter: Secondary | ICD-10-CM | POA: Diagnosis not present

## 2023-07-20 DIAGNOSIS — L578 Other skin changes due to chronic exposure to nonionizing radiation: Secondary | ICD-10-CM | POA: Diagnosis not present

## 2023-07-20 DIAGNOSIS — L821 Other seborrheic keratosis: Secondary | ICD-10-CM

## 2023-07-20 DIAGNOSIS — L820 Inflamed seborrheic keratosis: Secondary | ICD-10-CM | POA: Diagnosis not present

## 2023-07-20 DIAGNOSIS — L720 Epidermal cyst: Secondary | ICD-10-CM | POA: Diagnosis not present

## 2023-07-20 DIAGNOSIS — L814 Other melanin hyperpigmentation: Secondary | ICD-10-CM | POA: Diagnosis not present

## 2023-07-20 NOTE — Patient Instructions (Signed)

## 2023-07-20 NOTE — Progress Notes (Signed)
   Follow-Up Visit   Subjective  Pam Khan is a 52 y.o. female who presents for the following: Irregular, irritated skin lesion that patient would like checked.   The patient has spots, moles and lesions to be evaluated, some may be new or changing and the patient may have concern these could be cancer.   The following portions of the chart were reviewed this encounter and updated as appropriate: medications, allergies, medical history  Review of Systems:  No other skin or systemic complaints except as noted in HPI or Assessment and Plan.  Objective  Well appearing patient in no apparent distress; mood and affect are within normal limits.   A focused examination was performed of the following areas: the face   Relevant exam findings are noted in the Assessment and Plan.  Left Lower Eyelid x 2 (2) Small erythematous stuck-on, waxy papules at L lower eyelid just below mucosal margin    Assessment & Plan   SEBORRHEIC KERATOSIS - Stuck-on, waxy, tan-brown papules and/or plaques  - Benign-appearing - Discussed benign etiology and prognosis. - Observe - Call for any changes  Inflamed seborrheic keratosis (2) Left Lower Eyelid x 2  Symptomatic, irritating, patient would like treated.   Destruction of lesion - Left Lower Eyelid x 2 (2)  Destruction method: electrodesiccation and curettage   Destruction method comment:  Electrodesiccation only, not currettage Informed consent: discussed and consent obtained   Anesthesia: the lesion was anesthetized in a standard fashion   Anesthetic:  1% lidocaine w/ epinephrine 1-100,000 buffered w/ 8.4% NaHCO3 Hemostasis achieved with:  electrodesiccation Outcome: patient tolerated procedure well with no complications   Post-procedure details: wound care instructions given   Additional details:  Prior to procedure, discussed risks of scab formation, small wound, skin dyspigmentation, or rare scar following ED. Recommend Vaseline  ointment to treated areas while healing.   ACTINIC DAMAGE - chronic, secondary to cumulative UV radiation exposure/sun exposure over time - diffuse scaly erythematous macules with underlying dyspigmentation - Recommend daily broad spectrum sunscreen SPF 30+ to sun-exposed areas, reapply every 2 hours as needed.  - Recommend staying in the shade or wearing long sleeves, sun glasses (UVA+UVB protection) and wide brim hats (4-inch brim around the entire circumference of the hat). - Call for new or changing lesions.  LENTIGINES Exam: scattered tan macules Due to sun exposure Treatment Plan: Benign-appearing, observe. Recommend daily broad spectrum sunscreen SPF 30+ to sun-exposed areas, reapply every 2 hours as needed.  Call for any changes  [place] - R zygoma - tiny firm white papules face - type of cyst - benign - sometimes these will clear with nightly OTC adapalene/Differin 0.1% gel or retinol. - may be extracted if symptomatic - observe - recommend Adapalene 0.1% gel QHS, samples given of Effaclar adapalene 0.1% gel  Return if symptoms worsen or fail to improve.  Maylene Khan, CMA, am acting as scribe for [name], MD .  Documentation: I have reviewed the above documentation for accuracy and completeness, and I agree with the above.  [name], MD

## 2023-08-08 ENCOUNTER — Ambulatory Visit (INDEPENDENT_AMBULATORY_CARE_PROVIDER_SITE_OTHER): Payer: Commercial Managed Care - PPO | Admitting: Nurse Practitioner

## 2023-08-08 ENCOUNTER — Encounter: Payer: Self-pay | Admitting: Nurse Practitioner

## 2023-08-08 VITALS — BP 134/88 | HR 80 | Temp 98.2°F | Resp 16 | Ht 66.0 in | Wt 171.0 lb

## 2023-08-08 DIAGNOSIS — Z0001 Encounter for general adult medical examination with abnormal findings: Secondary | ICD-10-CM

## 2023-08-08 DIAGNOSIS — Z1211 Encounter for screening for malignant neoplasm of colon: Secondary | ICD-10-CM

## 2023-08-08 DIAGNOSIS — Z1212 Encounter for screening for malignant neoplasm of rectum: Secondary | ICD-10-CM

## 2023-08-08 DIAGNOSIS — Z1231 Encounter for screening mammogram for malignant neoplasm of breast: Secondary | ICD-10-CM

## 2023-08-08 DIAGNOSIS — K219 Gastro-esophageal reflux disease without esophagitis: Secondary | ICD-10-CM

## 2023-08-08 DIAGNOSIS — F411 Generalized anxiety disorder: Secondary | ICD-10-CM | POA: Diagnosis not present

## 2023-08-08 MED ORDER — OMEPRAZOLE 40 MG PO CPDR: DELAYED_RELEASE_CAPSULE | ORAL | 3 refills | Status: DC

## 2023-08-08 MED ORDER — ESCITALOPRAM OXALATE 20 MG PO TABS
20.0000 mg | ORAL_TABLET | Freq: Every day | ORAL | 1 refills | Status: DC
Start: 2023-08-08 — End: 2023-11-21

## 2023-08-08 NOTE — Progress Notes (Signed)
[facility] [address] [place], [place] [address]  Internal MEDICINE  Office Visit Note  Patient Name: ALOHA Khan  [id number]  [id number]  Date of Service: 08/08/2023  Chief Complaint  Patient presents with   Gastroesophageal Reflux   Hypertension   Annual Exam    HPI Pam Khan presents for an annual well visit and physical exam.  Well-appearing 52 y.o. female with hypertension, mild asthma, GERD, high cholesterol, GAD, low vitamin D and low back pain Routine CRC screening: opt for cologuard  Routine mammogram: due now, ordered  Pap smear: due now, will schedule follow up  Eye exam and/or foot exam: Labs: deferred until next visit  New or worsening pain: none  Other concerns: none     Current Medication: Outpatient Encounter Medications as of 08/08/2023  Medication Sig   albuterol (VENTOLIN HFA) 108 (90 Base) MCG/ACT inhaler Inhale 2 puffs into the lungs every 6 (six) hours as needed for wheezing or shortness of breath.   cyclobenzaprine (FLEXERIL) 10 MG tablet TAKE 1 TABLET BY MOUTH AT BEDTIME FOR BACK SPASM AS NEEDED ONLY   diltiazem (CARDIZEM LA) 180 MG 24 hr tablet TAKE 1 TABLET BY MOUTH EVERY DAY   famciclovir (FAMVIR) 500 MG tablet TAKE 1 TABLET BY MOUTH TWICE A DAY   triamterene-hydrochlorothiazide (MAXZIDE-25) 37.5-25 MG tablet TAKE 1 TABLET BY MOUTH EVERY DAY   [DISCONTINUED] escitalopram (LEXAPRO) 20 MG tablet TAKE 1 TABLET BY MOUTH EVERY DAY   [DISCONTINUED] omeprazole (PRILOSEC) 40 MG capsule TAKE ONE CAPSULE BY MOUTH EVERY DAY FOR HEART BURN   escitalopram (LEXAPRO) 20 MG tablet Take 1 tablet (20 mg total) by mouth daily.   omeprazole (PRILOSEC) 40 MG capsule TAKE ONE CAPSULE BY MOUTH EVERY DAY FOR HEART BURN   No facility-administered encounter medications on file as of 08/08/2023.    Surgical History: Past Surgical History:  Procedure Laterality Date   BACK SURGERY  1985   CESAREAN SECTION  1989   COSMETIC SURGERY  1985   head    CYSTOSCOPY WITH STENT PLACEMENT Left 05/17/2019   Procedure: CYSTOSCOPY WITH STENT PLACEMENT;  Surgeon: Vanna Scotland, MD;  Location: [facility];  Service: Urology;  Laterality: Left;   CYSTOSCOPY/URETEROSCOPY/HOLMIUM LASER/STENT PLACEMENT Left 05/25/2019   Procedure: CYSTOSCOPY/URETEROSCOPY/STENT Exchange;  Surgeon: Vanna Scotland, MD;  Location: [facility];  Service: Urology;  Laterality: Left;   DILATION AND CURETTAGE OF UTERUS  2007    Medical History: Past Medical History:  Diagnosis Date   Anxiety    Asthma    Barrett esophagus    DDD (degenerative disc disease), lumbar    Family history of adverse reaction to anesthesia    FATHER ALWAYS TROUBLE WAKING UP. WITH NECK SURGERY 2008/ASPIRATED EVENTUALLY DIED PNEUMONIA ETC   GERD (gastroesophageal reflux disease)    Hypertension    Spinal stenosis of lumbar region     Family History: Family History  Problem Relation Age of Onset   Breast cancer Mother 60   Breast cancer Maternal Grandmother 48   Diabetes Father    Heart attack Father     Social History   Socioeconomic History   Marital status: Married    Spouse name: Not on file   Number of children: Not on file   Years of education: Not on file   Highest education level: Not on file  Occupational History   Not on file  Tobacco Use   Smoking status: Never   Smokeless tobacco: Never  Vaping Use   Vaping status: Never  Used  Substance and Sexual Activity   Alcohol use: Yes    Comment: occasionally   Drug use: No   Sexual activity: Not on file  Other Topics Concern   Not on file  Social History Narrative   Not on file   Social Determinants of Health   Financial Resource Strain: Not on file  Food Insecurity: Not on file  Transportation Needs: Not on file  Physical Activity: Not on file  Stress: Not on file  Social Connections: Not on file  Intimate Partner Violence: Not on file      Review of Systems  Constitutional:  Negative for activity change, appetite  change, chills, fatigue, fever and unexpected weight change.  HENT: Negative.  Negative for congestion, ear pain, rhinorrhea, sore throat and trouble swallowing.   Eyes: Negative.   Respiratory: Negative.  Negative for cough, chest tightness, shortness of breath and wheezing.   Cardiovascular: Negative.  Negative for chest pain and palpitations.  Gastrointestinal: Negative.  Negative for abdominal pain, blood in stool, constipation, diarrhea, nausea and vomiting.  Endocrine: Negative.   Genitourinary: Negative.  Negative for difficulty urinating, dysuria, frequency, hematuria and urgency.  Musculoskeletal: Negative.  Negative for arthralgias, back pain, joint swelling, myalgias and neck pain.  Skin: Negative.  Negative for rash and wound.  Allergic/Immunologic: Negative.  Negative for immunocompromised state.  Neurological: Negative.  Negative for dizziness, seizures, numbness and headaches.  Hematological: Negative.   Psychiatric/Behavioral: Negative.  Negative for behavioral problems, self-injury and suicidal ideas. The patient is not nervous/anxious.     Vital Signs: BP 134/88   Pulse 80   Temp 98.2 F (36.8 C)   Resp 16   Ht 5\' 6"  (1.676 m)   Wt 171 lb (77.6 kg)   SpO2 99%   BMI 27.60 kg/m    Physical Exam Vitals and nursing note reviewed.  Constitutional:      General: She is not in acute distress.    Appearance: She is well-developed and normal weight. She is not diaphoretic.  HENT:     Head: Normocephalic and atraumatic.     Mouth/Throat:     Pharynx: No oropharyngeal exudate.  Eyes:     Pupils: Pupils are equal, round, and reactive to light.  Neck:     Thyroid: No thyromegaly.     Vascular: No JVD.     Trachea: No tracheal deviation.  Cardiovascular:     Rate and Rhythm: Normal rate and regular rhythm.     Heart sounds: Normal heart sounds. No murmur heard.    No friction rub. No gallop.  Pulmonary:     Effort: Pulmonary effort is normal. No respiratory  distress.     Breath sounds: No wheezing or rales.  Chest:     Chest wall: No tenderness.  Abdominal:     General: Bowel sounds are normal.     Palpations: Abdomen is soft.     Tenderness: There is no abdominal tenderness.  Musculoskeletal:        General: Normal range of motion.     Cervical back: Normal range of motion and neck supple.  Lymphadenopathy:     Cervical: No cervical adenopathy.  Skin:    General: Skin is warm and dry.  Neurological:     Mental Status: She is alert and oriented to person, place, and time.     Cranial Nerves: No cranial nerve deficit.  Psychiatric:        Behavior: Behavior normal.  Thought Content: Thought content normal.        Judgment: Judgment normal.        Assessment/Plan: 1. Encounter for routine adult health examination with abnormal findings Age-appropriate preventive screenings and vaccinations discussed, annual physical exam completed. Routine labs for health maintenance deferred until February 2025. PHM updated. Also due for pap smear and will schedule follow up appt in a couple of months for pap smear.   2. Encounter for screening mammogram for malignant neoplasm of breast Routine screening mammogram ordered  - MM 3D SCREENING MAMMOGRAM BILATERAL BREAST; Future  3. Gastroesophageal reflux disease without esophagitis Continue omeprazole as prescribed, refills ordered  - omeprazole (PRILOSEC) 40 MG capsule; TAKE ONE CAPSULE BY MOUTH EVERY DAY FOR HEART BURN  Dispense: 90 capsule; Refill: 3  4. GAD (generalized anxiety disorder) Continue lexapro as prescribed, refills ordered  - escitalopram (LEXAPRO) 20 MG tablet; Take 1 tablet (20 mg total) by mouth daily.  Dispense: 90 tablet; Refill: 1  5. Screening for colorectal cancer Cologuard ordered  - Cologuard     General Counseling: Rhea verbalizes understanding of the findings of todays visit and agrees with plan of treatment. I have discussed any further diagnostic  evaluation that may be needed or ordered today. We also reviewed her medications today. she has been encouraged to call the office with any questions or concerns that should arise related to todays visit.    Orders Placed This Encounter  Procedures   MM 3D SCREENING MAMMOGRAM BILATERAL BREAST   Cologuard    Meds ordered this encounter  Medications   omeprazole (PRILOSEC) 40 MG capsule    Sig: TAKE ONE CAPSULE BY MOUTH EVERY DAY FOR HEART BURN    Dispense:  90 capsule    Refill:  3    DX Code Needed  .   escitalopram (LEXAPRO) 20 MG tablet    Sig: Take 1 tablet (20 mg total) by mouth daily.    Dispense:  90 tablet    Refill:  1    For future refills.    Return in about 3 months (around 11/16/2023) for PAP ONLY with lauren PCP .   Total time spent:30 Minutes Time spent includes review of chart, medications, test results, and follow up plan with the patient.   [place] Controlled Substance Database was reviewed by me.  This patient was seen by Sallyanne Kuster, FNP-C in collaboration with Dr. Beverely Risen as a part of collaborative care agreement.  Lizanne Erker R. Tedd Sias, MSN, FNP-C Internal medicine

## 2023-08-10 ENCOUNTER — Other Ambulatory Visit: Payer: Self-pay | Admitting: Physician Assistant

## 2023-08-10 DIAGNOSIS — I10 Essential (primary) hypertension: Secondary | ICD-10-CM

## 2023-09-06 LAB — COLOGUARD: COLOGUARD: NEGATIVE

## 2023-09-20 ENCOUNTER — Telehealth: Payer: Self-pay

## 2023-09-20 NOTE — Telephone Encounter (Signed)
Lmom to call us back 

## 2023-09-20 NOTE — Telephone Encounter (Signed)
-----   Message from Carlean Jews sent at 09/20/2023 10:52 AM EST ----- Please let her know her cologuard was negative

## 2023-09-20 NOTE — Telephone Encounter (Signed)
Send mychart message that cologuard is negative

## 2023-10-12 ENCOUNTER — Ambulatory Visit: Payer: Commercial Managed Care - PPO | Admitting: Dermatology

## 2023-10-12 DIAGNOSIS — L720 Epidermal cyst: Secondary | ICD-10-CM

## 2023-10-12 DIAGNOSIS — L821 Other seborrheic keratosis: Secondary | ICD-10-CM | POA: Diagnosis not present

## 2023-10-12 DIAGNOSIS — L814 Other melanin hyperpigmentation: Secondary | ICD-10-CM

## 2023-10-12 DIAGNOSIS — L738 Other specified follicular disorders: Secondary | ICD-10-CM | POA: Diagnosis not present

## 2023-10-12 DIAGNOSIS — L820 Inflamed seborrheic keratosis: Secondary | ICD-10-CM

## 2023-10-12 NOTE — Progress Notes (Signed)
   Follow-Up Visit   Subjective  Pam Khan is a 52 y.o. female who presents for the following: patient is here for follow up on some spots under left eye that did not go away after treatment. She also reports some bothersome spots on cheeks she would like removed.   The patient has spots, moles and lesions to be evaluated, some may be new or changing and the patient may have concern these could be cancer.   The following portions of the chart were reviewed this encounter and updated as appropriate: medications, allergies, medical history  Review of Systems:  No other skin or systemic complaints except as noted in HPI or Assessment and Plan.  Objective  Well appearing patient in no apparent distress; mood and affect are within normal limits.   A focused examination was performed of the following areas: face  Relevant exam findings are noted in the Assessment and Plan.  Left Lower Eyelid x 2 (2) Residual erythematous stuck-on, waxy papule x 2    Assessment & Plan   MILIA  Exam: tiny erythematous firm white papule  left ant. jaw x 1, left cheek x 2, right zygoma x 1  Discussed this is a type of cyst. Benign-appearing. Sometimes these will clear with OTC adapalene/Differin 0.1% cream QHS or retinol.  Discussed extraction if symptomatic.  Treatment Plan: Symptomatic, irritating, patient would like treated.   Procedure risks and benefits were discussed with the patient including bruising and verbal consent was obtained. Following prep of the skin of the left chin, left anterior jaw and right zygoma  with an alcohol swab, area was injected with 1% lidocaine/epinephrine, extraction of milia was performed with cotton tip applicators following superficial incision made over their surfaces with a #11 surgical blade. Capillary hemostasis was achieved with 20% aluminum chloride solution. Vaseline ointment was applied to each site. The patient tolerated the procedure well.  Lesion on  left anterior jaw did not extract and thought to be sebaceous hyperplasia,so treated with ED.  Discussed risk of scar and recurrence.  SEBORRHEIC KERATOSIS - Stuck-on, waxy, tan-brown papules and/or plaques  - Benign-appearing - Discussed benign etiology and prognosis. - Observe - Call for any changes  Sebaceous Hyperplasia Face, left anterior jaw - Small yellow papules with a central dell - Benign-appearing - Observe. Call for changes.  LENTIGINES Exam: scattered tan macules face Due to sun exposure Treatment Plan: Benign-appearing, observe. Recommend daily broad spectrum sunscreen SPF 30+ to sun-exposed areas, reapply every 2 hours as needed.  Call for any changes   Return if symptoms worsen or fail to improve.  I, Asher Muir, CMA, am acting as scribe for [name], MD.  Documentation: I have reviewed the above documentation for accuracy and completeness, and I agree with the above.  [name], MD

## 2023-10-12 NOTE — Patient Instructions (Addendum)

## 2023-10-24 ENCOUNTER — Ambulatory Visit: Payer: Commercial Managed Care - PPO | Admitting: Physician Assistant

## 2023-11-08 ENCOUNTER — Ambulatory Visit
Admission: RE | Admit: 2023-11-08 | Discharge: 2023-11-08 | Disposition: A | Discharge status: Home / Self Care | Payer: Commercial Managed Care - PPO | Source: Ambulatory Visit | Attending: Nurse Practitioner | Admitting: Nurse Practitioner

## 2023-11-08 DIAGNOSIS — Z1231 Encounter for screening mammogram for malignant neoplasm of breast: Secondary | ICD-10-CM | POA: Diagnosis present

## 2023-11-20 ENCOUNTER — Other Ambulatory Visit: Payer: Self-pay | Admitting: Physician Assistant

## 2023-11-20 DIAGNOSIS — B001 Herpesviral vesicular dermatitis: Secondary | ICD-10-CM

## 2023-11-21 ENCOUNTER — Encounter: Payer: Self-pay | Admitting: Physician Assistant

## 2023-11-21 ENCOUNTER — Ambulatory Visit: Payer: Commercial Managed Care - PPO | Admitting: Physician Assistant

## 2023-11-21 VITALS — BP 150/80 | HR 82 | Temp 97.6°F | Resp 16 | Ht 66.0 in | Wt 167.2 lb

## 2023-11-21 DIAGNOSIS — Z113 Encounter for screening for infections with a predominantly sexual mode of transmission: Secondary | ICD-10-CM | POA: Diagnosis not present

## 2023-11-21 DIAGNOSIS — R300 Dysuria: Secondary | ICD-10-CM

## 2023-11-21 DIAGNOSIS — F411 Generalized anxiety disorder: Secondary | ICD-10-CM

## 2023-11-21 DIAGNOSIS — Z01419 Encounter for gynecological examination (general) (routine) without abnormal findings: Secondary | ICD-10-CM | POA: Diagnosis not present

## 2023-11-21 DIAGNOSIS — Z124 Encounter for screening for malignant neoplasm of cervix: Secondary | ICD-10-CM

## 2023-11-21 DIAGNOSIS — I10 Essential (primary) hypertension: Secondary | ICD-10-CM | POA: Diagnosis not present

## 2023-11-21 DIAGNOSIS — R319 Hematuria, unspecified: Secondary | ICD-10-CM

## 2023-11-21 DIAGNOSIS — N390 Urinary tract infection, site not specified: Secondary | ICD-10-CM

## 2023-11-21 LAB — POCT URINALYSIS DIPSTICK
Bilirubin, UA: NEGATIVE
Blood, UA: POSITIVE
Clarity, UA: NEGATIVE
Color, UA: NEGATIVE
Glucose, UA: NEGATIVE
Ketones, UA: POSITIVE
Nitrite, UA: POSITIVE
Protein, UA: NEGATIVE
Spec Grav, UA: 1.01 (ref 1.010–1.025)
Urobilinogen, UA: 0.2 U/dL
pH, UA: 5 (ref 5.0–8.0)

## 2023-11-21 MED ORDER — ESCITALOPRAM OXALATE 10 MG PO TABS: 10.0000 mg | ORAL_TABLET | Freq: Every day | ORAL | 1 refills | Status: DC

## 2023-11-21 NOTE — Progress Notes (Signed)
 [facility] [address] [place], [place] [address]  Internal MEDICINE  Office Visit Note  Patient Name: Pam Khan  [id number]  [id number]  Date of Service: 11/30/2023   Chief Complaint  Patient presents with   Follow-up   Gastroesophageal Reflux   Hypertension   Urinary Tract Infection     HPI Pt is here for a pap smear -having a little odor to urine, no burning or pelvic pain. Will check for UTI -taking 10mg  lexparo (only taking 1/2 tab of her 20mg  and does well with this). Will send new script for 10mg  dose -has not taken Bp meds recently. Wanted to see if BP improved or not. Will restart now, as BP still 156/80 on recheck  Current Medication: Outpatient Encounter Medications as of 11/21/2023  Medication Sig   albuterol  (VENTOLIN  HFA) 108 (90 Base) MCG/ACT inhaler Inhale 2 puffs into the lungs every 6 (six) hours as needed for wheezing or shortness of breath.   cyclobenzaprine  (FLEXERIL ) 10 MG tablet TAKE 1 TABLET BY MOUTH AT BEDTIME FOR BACK SPASM AS NEEDED ONLY   diltiazem  (MATZIM LA ) 180 MG 24 hr tablet TAKE 1 TABLET BY MOUTH EVERY DAY   escitalopram  (LEXAPRO ) 10 MG tablet Take 1 tablet (10 mg total) by mouth daily.   famciclovir  (FAMVIR ) 500 MG tablet TAKE 1 TABLET BY MOUTH TWICE A DAY   omeprazole  (PRILOSEC) 40 MG capsule TAKE ONE CAPSULE BY MOUTH EVERY DAY FOR HEART BURN   triamterene -hydrochlorothiazide (MAXZIDE-25) 37.5-25 MG tablet TAKE 1 TABLET BY MOUTH EVERY DAY   [DISCONTINUED] escitalopram  (LEXAPRO ) 20 MG tablet Take 1 tablet (20 mg total) by mouth daily.   No facility-administered encounter medications on file as of 11/21/2023.    Surgical History: Past Surgical History:  Procedure Laterality Date   BACK SURGERY  1985   CESAREAN SECTION  1989   COSMETIC SURGERY  1985   head   CYSTOSCOPY WITH STENT PLACEMENT Left 05/17/2019   Procedure: CYSTOSCOPY WITH STENT PLACEMENT;  Surgeon: Penne Knee, MD;  Location: [facility];  Service:  Urology;  Laterality: Left;   CYSTOSCOPY/URETEROSCOPY/HOLMIUM LASER/STENT PLACEMENT Left 05/25/2019   Procedure: CYSTOSCOPY/URETEROSCOPY/STENT Exchange;  Surgeon: Penne Knee, MD;  Location: [facility];  Service: Urology;  Laterality: Left;   DILATION AND CURETTAGE OF UTERUS  2007    Medical History: Past Medical History:  Diagnosis Date   Anxiety    Asthma    Barrett esophagus    DDD (degenerative disc disease), lumbar    Family history of adverse reaction to anesthesia    FATHER ALWAYS TROUBLE WAKING UP. WITH NECK SURGERY 2008/ASPIRATED EVENTUALLY DIED PNEUMONIA ETC   GERD (gastroesophageal reflux disease)    Hypertension    Spinal stenosis of lumbar region     Family History: Family History  Problem Relation Age of Onset   Breast cancer Mother 78   Breast cancer Maternal Grandmother 65   Diabetes Father    Heart attack Father     Social History   Socioeconomic History   Marital status: Married    Spouse name: Not on file   Number of children: Not on file   Years of education: Not on file   Highest education level: Not on file  Occupational History   Not on file  Tobacco Use   Smoking status: Never   Smokeless tobacco: Never  Vaping Use   Vaping status: Never Used  Substance and Sexual Activity   Alcohol use: Yes    Comment: occasionally  Drug use: No   Sexual activity: Not on file  Other Topics Concern   Not on file  Social History Narrative   Not on file   Social Drivers of Health   Financial Resource Strain: Not on file  Food Insecurity: Not on file  Transportation Needs: Not on file  Physical Activity: Not on file  Stress: Not on file  Social Connections: Not on file  Intimate Partner Violence: Not on file     Review of Systems  Constitutional:  Negative for chills, fatigue and unexpected weight change.  HENT:  Negative for congestion, postnasal drip, rhinorrhea, sneezing and sore throat.   Eyes:  Negative for redness.  Respiratory:   Negative for cough, chest tightness and shortness of breath.   Cardiovascular:  Negative for chest pain and palpitations.  Gastrointestinal:  Negative for abdominal pain, constipation, diarrhea, nausea and vomiting.  Genitourinary:  Negative for dysuria, flank pain and frequency.       Urine odor  Musculoskeletal:  Negative for arthralgias, back pain, joint swelling and neck pain.  Skin:  Negative for rash.  Neurological: Negative.  Negative for tremors and numbness.  Hematological:  Negative for adenopathy. Does not bruise/bleed easily.  Psychiatric/Behavioral:  Negative for behavioral problems (Depression), sleep disturbance and suicidal ideas. The patient is not nervous/anxious.      Vital signs: BP (!) 150/80   Pulse 82   Temp 97.6 F (36.4 C)   Resp 16   Ht 5' 6 (1.676 m)   Wt 167 lb 3.2 oz (75.8 kg)   SpO2 96%   BMI 26.99 kg/m    Physical Exam Vitals and nursing note reviewed. Exam conducted with a chaperone present.  Constitutional:      General: She is not in acute distress.    Appearance: She is well-developed and normal weight. She is not diaphoretic.  HENT:     Head: Normocephalic and atraumatic.     Mouth/Throat:     Pharynx: No oropharyngeal exudate.  Eyes:     Pupils: Pupils are equal, round, and reactive to light.  Neck:     Thyroid: No thyromegaly.     Vascular: No JVD.     Trachea: No tracheal deviation.  Cardiovascular:     Rate and Rhythm: Normal rate and regular rhythm.     Heart sounds: Normal heart sounds. No murmur heard.    No friction rub. No gallop.  Pulmonary:     Effort: Pulmonary effort is normal. No respiratory distress.     Breath sounds: No wheezing or rales.  Chest:     Chest wall: No tenderness.  Abdominal:     General: Bowel sounds are normal.     Palpations: Abdomen is soft.     Tenderness: There is no abdominal tenderness.  Genitourinary:    Vagina: Vaginal discharge present.     Cervix: No friability or cervical bleeding.      Comments: Pap performed Musculoskeletal:        General: Normal range of motion.     Cervical back: Normal range of motion and neck supple.  Lymphadenopathy:     Cervical: No cervical adenopathy.  Skin:    General: Skin is warm and dry.  Neurological:     Mental Status: She is alert and oriented to person, place, and time.     Cranial Nerves: No cranial nerve deficit.  Psychiatric:        Behavior: Behavior normal.        Thought  Content: Thought content normal.        Judgment: Judgment normal.       Assessment/Plan: 1. Essential hypertension Restart medications and monitor, follow up in 1 month  2. Encounter for gynecological examination (Primary) Pelvic exam performed with pap  3. Screening for STDs (sexually transmitted diseases) - NuSwab Vaginitis Plus (VG+)  4. Routine cervical smear - IGP, Aptima HPV  5. GAD (generalized anxiety disorder) New script for 10mg  dose sent - escitalopram  (LEXAPRO ) 10 MG tablet; Take 1 tablet (10 mg total) by mouth daily.  Dispense: 90 tablet; Refill: 1  6. Urinary tract infection with hematuria, site unspecified Will end for culture and tx accordingly - CULTURE, URINE COMPREHENSIVE  7. Dysuria - POCT Urinalysis Dipstick   General Counseling: Annalynn verbalizes understanding of the findings of todays visit and agrees with plan of treatment. I have discussed any further diagnostic evaluation that may be needed or ordered today. We also reviewed her medications today. she has been encouraged to call the office with any questions or concerns that should arise related to todays visit.    Counseling:    Orders Placed This Encounter  Procedures   CULTURE, URINE COMPREHENSIVE   NuSwab Vaginitis Plus (VG+)   POCT Urinalysis Dipstick    Meds ordered this encounter  Medications   escitalopram  (LEXAPRO ) 10 MG tablet    Sig: Take 1 tablet (10 mg total) by mouth daily.    Dispense:  90 tablet    Refill:  1    Pt will call for  refills    Time spent:30 Minutes

## 2023-11-24 ENCOUNTER — Other Ambulatory Visit: Payer: Self-pay

## 2023-11-24 ENCOUNTER — Telehealth: Payer: Self-pay

## 2023-11-24 LAB — CULTURE, URINE COMPREHENSIVE

## 2023-11-24 LAB — NUSWAB VAGINITIS PLUS (VG+)
Atopobium vaginae: HIGH {score} — AB
BVAB 2: HIGH {score} — AB
Candida albicans, NAA: NEGATIVE
Candida glabrata, NAA: NEGATIVE
Chlamydia trachomatis, NAA: NEGATIVE
Neisseria gonorrhoeae, NAA: NEGATIVE

## 2023-11-24 MED ORDER — METRONIDAZOLE 500 MG PO TABS: 500.0000 mg | ORAL_TABLET | Freq: Two times a day (BID) | ORAL | 0 refills | Status: AC

## 2023-11-24 MED ORDER — CIPROFLOXACIN HCL 250 MG PO TABS: 250.0000 mg | ORAL_TABLET | Freq: Two times a day (BID) | ORAL | 0 refills | Status: AC

## 2023-11-24 NOTE — Telephone Encounter (Signed)
-----   Message from Carlean Jews sent at 11/24/2023  4:22 PM EST ----- Please let her know that she does have a UTI and send cipro BID for 7 days please. She is also positive for trich and BV which can be treated with Flagyl--send BID for 7 days. Any partner also needs to be treated.

## 2023-11-24 NOTE — Telephone Encounter (Addendum)
Tried to call patient regarding lab results, VM is full. Sent MyChart message.

## 2023-11-25 ENCOUNTER — Telehealth: Payer: Self-pay

## 2023-11-25 LAB — IGP, APTIMA HPV: HPV Aptima: POSITIVE — AB

## 2023-11-25 NOTE — Telephone Encounter (Signed)
Spoke with patient regarding urine results. Advised patient to tell any sexual partners to please be treated for trich as well. Also advised patient not to drink alcohol while on Flagyl.

## 2023-11-25 NOTE — Telephone Encounter (Signed)
-----   Message from Carlean Jews sent at 11/24/2023  4:22 PM EST ----- Please let her know that she does have a UTI and send cipro BID for 7 days please. She is also positive for trich and BV which can be treated with Flagyl--send BID for 7 days. Any partner also needs to be treated.

## 2023-11-30 ENCOUNTER — Telehealth: Payer: Self-pay

## 2023-11-30 NOTE — Telephone Encounter (Signed)
-----   Message from Carlean Jews sent at 11/29/2023  8:48 AM EST ----- Please let her know that her pap was negative, however she was positive for HPV. Recommend repeat pap in 1 year.

## 2023-11-30 NOTE — Telephone Encounter (Signed)
Tried to lvm for patient regarding pap results, vm full.

## 2023-12-19 ENCOUNTER — Ambulatory Visit: Payer: Commercial Managed Care - PPO | Admitting: Physician Assistant

## 2024-02-21 ENCOUNTER — Ambulatory Visit: Admitting: Dermatology

## 2024-03-08 ENCOUNTER — Ambulatory Visit: Admitting: Dermatology

## 2024-03-08 ENCOUNTER — Encounter: Payer: Self-pay | Admitting: Dermatology

## 2024-03-08 DIAGNOSIS — L821 Other seborrheic keratosis: Secondary | ICD-10-CM | POA: Diagnosis not present

## 2024-03-08 DIAGNOSIS — L820 Inflamed seborrheic keratosis: Secondary | ICD-10-CM

## 2024-03-08 NOTE — Progress Notes (Signed)
   Follow-Up Visit   Subjective  TASMIA VANLIEW is a 53 y.o. female who presents for the following: Spots. Rubbed, irritated by clothing. Itching. Would like removed.   The patient has spots, moles and lesions to be evaluated, some may be new or changing and the patient may have concern these could be cancer.    The following portions of the chart were reviewed this encounter and updated as appropriate: medications, allergies, medical history  Review of Systems:  No other skin or systemic complaints except as noted in HPI or Assessment and Plan.  Objective  Well appearing patient in no apparent distress; mood and affect are within normal limits.  A focused examination was performed of the following areas: Torso Relevant physical exam findings are noted in the Assessment and Plan.  L lat Breast x1, L mid back x1, L flank x1, R mid back x1, L inguinal crease x2 (6) Erythematous keratotic or waxy stuck-on papule or plaque.  Assessment & Plan   SEBORRHEIC KERATOSIS - Stuck-on, waxy, tan-brown papules and/or plaques  - Benign-appearing - Discussed benign etiology and prognosis. - Observe - Call for any changes    INFLAMED SEBORRHEIC KERATOSIS (6) L lat Breast x1, L mid back x1, L flank x1, R mid back x1, L inguinal crease x2 (6) Symptomatic, irritating, patient would like treated. Destruction of lesion - L lat Breast x1, L mid back x1, L flank x1, R mid back x1, L inguinal crease x2 (6) Complexity: simple   Destruction method: cryotherapy   Informed consent: discussed and consent obtained   Timeout:  patient name, date of birth, surgical site, and procedure verified Lesion destroyed using liquid nitrogen: Yes   Region frozen until ice ball extended beyond lesion: Yes   Cryo cycles: 1 or 2. Outcome: patient tolerated procedure well with no complications   Post-procedure details: wound care instructions given   Additional details:  Prior to procedure, discussed risks of  blister formation, small wound, skin dyspigmentation, or rare scar following cryotherapy. Recommend Vaseline ointment to treated areas while healing.  SEBORRHEIC KERATOSES   Patient prefers PRN follow up  Return if symptoms worsen or fail to improve.  I, Jill Parcell, CMA, am acting as scribe for Harris Liming, MD.   Documentation: I have reviewed the above documentation for accuracy and completeness, and I agree with the above.  Harris Liming, MD

## 2024-03-08 NOTE — Patient Instructions (Addendum)

## 2024-03-16 ENCOUNTER — Encounter: Payer: Self-pay | Admitting: Physician Assistant

## 2024-03-16 ENCOUNTER — Ambulatory Visit (INDEPENDENT_AMBULATORY_CARE_PROVIDER_SITE_OTHER): Admitting: Physician Assistant

## 2024-03-16 VITALS — BP 136/74 | HR 98 | Temp 98.3°F | Resp 16 | Ht 66.0 in | Wt 168.2 lb

## 2024-03-16 DIAGNOSIS — E783 Hyperchylomicronemia: Secondary | ICD-10-CM

## 2024-03-16 DIAGNOSIS — I10 Essential (primary) hypertension: Secondary | ICD-10-CM | POA: Diagnosis not present

## 2024-03-16 DIAGNOSIS — N390 Urinary tract infection, site not specified: Secondary | ICD-10-CM | POA: Diagnosis not present

## 2024-03-16 DIAGNOSIS — Z1329 Encounter for screening for other suspected endocrine disorder: Secondary | ICD-10-CM

## 2024-03-16 DIAGNOSIS — E559 Vitamin D deficiency, unspecified: Secondary | ICD-10-CM

## 2024-03-16 DIAGNOSIS — R300 Dysuria: Secondary | ICD-10-CM

## 2024-03-16 DIAGNOSIS — E538 Deficiency of other specified B group vitamins: Secondary | ICD-10-CM

## 2024-03-16 DIAGNOSIS — R319 Hematuria, unspecified: Secondary | ICD-10-CM

## 2024-03-16 DIAGNOSIS — F411 Generalized anxiety disorder: Secondary | ICD-10-CM

## 2024-03-16 DIAGNOSIS — R5383 Other fatigue: Secondary | ICD-10-CM

## 2024-03-16 LAB — POCT URINALYSIS DIPSTICK
Bilirubin, UA: NEGATIVE
Glucose, UA: NEGATIVE
Leukocytes, UA: NEGATIVE
Nitrite, UA: NEGATIVE
Protein, UA: NEGATIVE
Spec Grav, UA: 1.015 (ref 1.010–1.025)
Urobilinogen, UA: 0.2 U/dL
pH, UA: 6 (ref 5.0–8.0)

## 2024-03-16 MED ORDER — BUPROPION HCL ER (XL) 150 MG PO TB24: 150.0000 mg | ORAL_TABLET | Freq: Every day | ORAL | 2 refills | Status: DC

## 2024-03-16 MED ORDER — ESCITALOPRAM OXALATE 20 MG PO TABS: 20.0000 mg | ORAL_TABLET | Freq: Every day | ORAL | 1 refills | Status: DC

## 2024-03-16 NOTE — Progress Notes (Signed)
 [facility] [address] [place], [place] [address]  Internal MEDICINE  Office Visit Note  Patient Name: Pam Khan  [id number]  [id number]  Date of Service: 03/16/2024  Chief Complaint  Patient presents with   Gastroesophageal Reflux   Hypertension   Follow-up    HPI Pt is here for routine follow up -UTI symptoms of burning with urination since Sunday and odor to urine. No urgency or frequency. No vaginal discharge or irritation.  -Did have trich and BV on pelvic exam lasrt visit, however pt states she did not complete flagyl , was concerned about possible S/E and interaction with alcohol. Discussed importance of clearing infection. Will hold off on repeat swab until treatment completed. States she has it at home and will move forward with treatment. -More stressed and tired all the time. More stressors with son having MDD and dog having upcoming surgery. Is still taking 20mg  lexapro  not the 10mg . Has taken wellbutrin before and is open to retrying -Doesn't wake refreshed, feels she could sleep all day. Some snoring, but no gasping -will check labs, but may need to consider sleep study in future  Current Medication: Outpatient Encounter Medications as of 03/16/2024  Medication Sig   albuterol  (VENTOLIN  HFA) 108 (90 Base) MCG/ACT inhaler Inhale 2 puffs into the lungs every 6 (six) hours as needed for wheezing or shortness of breath.   buPROPion (WELLBUTRIN XL) 150 MG 24 hr tablet Take 1 tablet (150 mg total) by mouth daily.   diltiazem  (MATZIM LA ) 180 MG 24 hr tablet TAKE 1 TABLET BY MOUTH EVERY DAY   escitalopram  (LEXAPRO ) 20 MG tablet Take 1 tablet (20 mg total) by mouth daily.   famciclovir  (FAMVIR ) 500 MG tablet TAKE 1 TABLET BY MOUTH TWICE A DAY   omeprazole  (PRILOSEC) 40 MG capsule TAKE ONE CAPSULE BY MOUTH EVERY DAY FOR HEART BURN   triamterene -hydrochlorothiazide (MAXZIDE-25) 37.5-25 MG tablet TAKE 1 TABLET BY MOUTH EVERY DAY   No facility-administered  encounter medications on file as of 03/16/2024.    Surgical History: Past Surgical History:  Procedure Laterality Date   BACK SURGERY  1985   CESAREAN SECTION  1989   COSMETIC SURGERY  1985   head   CYSTOSCOPY WITH STENT PLACEMENT Left 05/17/2019   Procedure: CYSTOSCOPY WITH STENT PLACEMENT;  Surgeon: Dustin Gimenez, MD;  Location: [facility];  Service: Urology;  Laterality: Left;   CYSTOSCOPY/URETEROSCOPY/HOLMIUM LASER/STENT PLACEMENT Left 05/25/2019   Procedure: CYSTOSCOPY/URETEROSCOPY/STENT Exchange;  Surgeon: Dustin Gimenez, MD;  Location: [facility];  Service: Urology;  Laterality: Left;   DILATION AND CURETTAGE OF UTERUS  2007    Medical History: Past Medical History:  Diagnosis Date   Anxiety    Asthma    Barrett esophagus    DDD (degenerative disc disease), lumbar    Family history of adverse reaction to anesthesia    FATHER ALWAYS TROUBLE WAKING UP. WITH NECK SURGERY 2008/ASPIRATED EVENTUALLY DIED PNEUMONIA ETC   GERD (gastroesophageal reflux disease)    Hypertension    Spinal stenosis of lumbar region     Family History: Family History  Problem Relation Age of Onset   Breast cancer Mother 51   Breast cancer Maternal Grandmother 81   Diabetes Father    Heart attack Father     Social History   Socioeconomic History   Marital status: Married    Spouse name: Not on file   Number of children: Not on file   Years of education: Not on file   Highest education  level: Not on file  Occupational History   Not on file  Tobacco Use   Smoking status: Never   Smokeless tobacco: Never  Vaping Use   Vaping status: Never Used  Substance and Sexual Activity   Alcohol use: Yes    Comment: occasionally   Drug use: No   Sexual activity: Not on file  Other Topics Concern   Not on file  Social History Narrative   Not on file   Social Drivers of Health   Financial Resource Strain: Not on file  Food Insecurity: Not on file  Transportation Needs: Not on file  Physical  Activity: Not on file  Stress: Not on file  Social Connections: Not on file  Intimate Partner Violence: Not on file      Review of Systems  Constitutional:  Positive for fatigue. Negative for chills and unexpected weight change.  HENT:  Negative for congestion, postnasal drip, rhinorrhea, sneezing and sore throat.   Eyes:  Negative for redness.  Respiratory:  Negative for cough, chest tightness and shortness of breath.   Cardiovascular:  Negative for chest pain and palpitations.  Gastrointestinal:  Negative for abdominal pain, constipation, diarrhea, nausea and vomiting.  Genitourinary:  Positive for dysuria. Negative for flank pain, frequency, pelvic pain and vaginal discharge.       Urine odor  Musculoskeletal:  Negative for arthralgias, back pain, joint swelling and neck pain.  Skin:  Negative for rash.  Neurological: Negative.  Negative for tremors and numbness.  Hematological:  Negative for adenopathy. Does not bruise/bleed easily.  Psychiatric/Behavioral:  Positive for dysphoric mood. Negative for behavioral problems (Depression), sleep disturbance and suicidal ideas. The patient is nervous/anxious.     Vital Signs: BP 136/74   Pulse 98   Temp 98.3 F (36.8 C)   Resp 16   Ht 5\' 6"  (1.676 m)   Wt 168 lb 3.2 oz (76.3 kg)   SpO2 98%   BMI 27.15 kg/m    Physical Exam Vitals and nursing note reviewed.  Constitutional:      General: She is not in acute distress.    Appearance: She is well-developed and normal weight.  HENT:     Head: Normocephalic and atraumatic.  Eyes:     Extraocular Movements: Extraocular movements intact.  Neck:     Thyroid: No thyromegaly.     Vascular: No JVD.     Trachea: No tracheal deviation.  Cardiovascular:     Rate and Rhythm: Normal rate and regular rhythm.     Heart sounds: Normal heart sounds. No murmur heard.    No friction rub. No gallop.  Pulmonary:     Effort: Pulmonary effort is normal.  Musculoskeletal:        General:  Normal range of motion.     Cervical back: Normal range of motion and neck supple.  Lymphadenopathy:     Cervical: No cervical adenopathy.     Upper Body:     Left upper body: No axillary adenopathy.  Skin:    General: Skin is warm and dry.  Neurological:     Mental Status: She is alert and oriented to person, place, and time.  Psychiatric:        Behavior: Behavior normal.        Thought Content: Thought content normal.        Judgment: Judgment normal.        Assessment/Plan: 1. Essential hypertension (Primary) Improved, continue current medication  2. GAD (generalized anxiety disorder) Will  continue lexparo and add wellbutrin - escitalopram  (LEXAPRO ) 20 MG tablet; Take 1 tablet (20 mg total) by mouth daily.  Dispense: 90 tablet; Refill: 1 - buPROPion (WELLBUTRIN XL) 150 MG 24 hr tablet; Take 1 tablet (150 mg total) by mouth daily.  Dispense: 30 tablet; Refill: 2  3. Urinary tract infection with hematuria, site unspecified Will send for culture - CULTURE, URINE COMPREHENSIVE  4. Dysuria - POCT Urinalysis Dipstick  5. Vitamin D  deficiency - VITAMIN D  25 Hydroxy (Vit-D Deficiency, Fractures)  6. B12 deficiency - B12 and Folate Panel  7. Mixed hyperglyceridemia - Lipid Panel With LDL/HDL Ratio  8. Thyroid disorder screen - TSH + free T4  9. Other fatigue - CBC w/Diff/Platelet - Comprehensive metabolic panel with GFR - TSH + free T4 - Lipid Panel With LDL/HDL Ratio - B12 and Folate Panel - VITAMIN D  25 Hydroxy (Vit-D Deficiency, Fractures) - Fe+TIBC+Fer   General Counseling: Gregary Lean verbalizes understanding of the findings of todays visit and agrees with plan of treatment. I have discussed any further diagnostic evaluation that may be needed or ordered today. We also reviewed her medications today. she has been encouraged to call the office with any questions or concerns that should arise related to todays visit.    Orders Placed This Encounter  Procedures    CULTURE, URINE COMPREHENSIVE   CBC w/Diff/Platelet   Comprehensive metabolic panel with GFR   TSH + free T4   Lipid Panel With LDL/HDL Ratio   B12 and Folate Panel   VITAMIN D  25 Hydroxy (Vit-D Deficiency, Fractures)   Fe+TIBC+Fer   POCT Urinalysis Dipstick    Meds ordered this encounter  Medications   escitalopram  (LEXAPRO ) 20 MG tablet    Sig: Take 1 tablet (20 mg total) by mouth daily.    Dispense:  90 tablet    Refill:  1   buPROPion (WELLBUTRIN XL) 150 MG 24 hr tablet    Sig: Take 1 tablet (150 mg total) by mouth daily.    Dispense:  30 tablet    Refill:  2    This patient was seen by Taylor Favia, PA-C in collaboration with Dr. Verneta Gone as a part of collaborative care agreement.   Total time spent:30 Minutes Time spent includes review of chart, medications, test results, and follow up plan with the patient.      Dr [name] Internal medicine

## 2024-03-21 ENCOUNTER — Other Ambulatory Visit: Payer: Self-pay

## 2024-03-21 ENCOUNTER — Ambulatory Visit: Payer: Self-pay | Admitting: Physician Assistant

## 2024-03-21 LAB — CULTURE, URINE COMPREHENSIVE

## 2024-03-21 MED ORDER — NITROFURANTOIN MONOHYD MACRO 100 MG PO CAPS: 100.0000 mg | ORAL_CAPSULE | Freq: Two times a day (BID) | ORAL | 0 refills | Status: DC

## 2024-03-21 NOTE — Telephone Encounter (Signed)
-----   Message from Jacques Mattock sent at 03/21/2024  8:44 AM EDT ----- Please let her know that her urine culture does show she has a UTI and send macrobid

## 2024-03-21 NOTE — Telephone Encounter (Signed)
 Pt notified for UA result and sent macrobid

## 2024-04-07 ENCOUNTER — Other Ambulatory Visit: Payer: Self-pay | Admitting: Physician Assistant

## 2024-04-07 DIAGNOSIS — F411 Generalized anxiety disorder: Secondary | ICD-10-CM

## 2024-04-24 LAB — CBC WITH DIFFERENTIAL/PLATELET
Basophils Absolute: 0 10*3/uL (ref 0.0–0.2)
Basos: 1 %
EOS (ABSOLUTE): 0.1 10*3/uL (ref 0.0–0.4)
Eos: 2 %
Hematocrit: 44.3 % (ref 34.0–46.6)
Hemoglobin: 14.8 g/dL (ref 11.1–15.9)
Immature Grans (Abs): 0 10*3/uL (ref 0.0–0.1)
Immature Granulocytes: 0 %
Lymphocytes Absolute: 1.4 10*3/uL (ref 0.7–3.1)
Lymphs: 22 %
MCH: 31.3 pg (ref 26.6–33.0)
MCHC: 33.4 g/dL (ref 31.5–35.7)
MCV: 94 fL (ref 79–97)
Monocytes Absolute: 0.5 10*3/uL (ref 0.1–0.9)
Monocytes: 7 %
Neutrophils Absolute: 4.3 10*3/uL (ref 1.4–7.0)
Neutrophils: 67 %
Platelets: 310 10*3/uL (ref 150–450)
RBC: 4.73 x10E6/uL (ref 3.77–5.28)
RDW: 12.9 % (ref 11.7–15.4)
WBC: 6.3 10*3/uL (ref 3.4–10.8)

## 2024-04-24 LAB — COMPREHENSIVE METABOLIC PANEL WITH GFR
ALT: 22 IU/L (ref 0–32)
AST: 20 IU/L (ref 0–40)
Albumin: 4.4 g/dL (ref 3.8–4.9)
Alkaline Phosphatase: 128 IU/L — ABNORMAL HIGH (ref 44–121)
BUN/Creatinine Ratio: 16 (ref 9–23)
BUN: 14 mg/dL (ref 6–24)
Bilirubin Total: 0.4 mg/dL (ref 0.0–1.2)
CO2: 21 mmol/L (ref 20–29)
Calcium: 9.7 mg/dL (ref 8.7–10.2)
Chloride: 100 mmol/L (ref 96–106)
Creatinine, Ser: 0.88 mg/dL (ref 0.57–1.00)
Globulin, Total: 2.8 g/dL (ref 1.5–4.5)
Glucose: 123 mg/dL — ABNORMAL HIGH (ref 70–99)
Potassium: 4.4 mmol/L (ref 3.5–5.2)
Sodium: 139 mmol/L (ref 134–144)
Total Protein: 7.2 g/dL (ref 6.0–8.5)
eGFR: 78 mL/min/{1.73_m2} (ref >59)

## 2024-04-24 LAB — B12 AND FOLATE PANEL
Folate: 7.4 ng/mL (ref >3.0)
Vitamin B-12: 367 pg/mL (ref 232–1245)

## 2024-04-24 LAB — IRON,TIBC AND FERRITIN PANEL
Ferritin: 40 ng/mL (ref 15–150)
Iron Saturation: 22 % (ref 15–55)
Iron: 85 ug/dL (ref 27–159)
Total Iron Binding Capacity: 379 ug/dL (ref 250–450)
UIBC: 294 ug/dL (ref 131–425)

## 2024-04-24 LAB — LIPID PANEL WITH LDL/HDL RATIO
Cholesterol, Total: 207 mg/dL — ABNORMAL HIGH (ref 100–199)
HDL: 38 mg/dL — ABNORMAL LOW (ref >39)
LDL Chol Calc (NIH): 114 mg/dL — ABNORMAL HIGH (ref 0–99)
LDL/HDL Ratio: 3 ratio (ref 0.0–3.2)
Triglycerides: 318 mg/dL — ABNORMAL HIGH (ref 0–149)
VLDL Cholesterol Cal: 55 mg/dL — ABNORMAL HIGH (ref 5–40)

## 2024-04-24 LAB — TSH+FREE T4
Free T4: 0.94 ng/dL (ref 0.82–1.77)
TSH: 1.6 u[IU]/mL (ref 0.450–4.500)

## 2024-04-24 LAB — VITAMIN D 25 HYDROXY (VIT D DEFICIENCY, FRACTURES): Vit D, 25-Hydroxy: 23.9 ng/mL — ABNORMAL LOW (ref 30.0–100.0)

## 2024-04-27 ENCOUNTER — Ambulatory Visit (INDEPENDENT_AMBULATORY_CARE_PROVIDER_SITE_OTHER): Admitting: Physician Assistant

## 2024-04-27 ENCOUNTER — Encounter: Payer: Self-pay | Admitting: Physician Assistant

## 2024-04-27 VITALS — BP 136/82 | HR 89 | Temp 98.2°F | Resp 16 | Ht 66.0 in | Wt 173.0 lb

## 2024-04-27 DIAGNOSIS — R7301 Impaired fasting glucose: Secondary | ICD-10-CM

## 2024-04-27 DIAGNOSIS — R7303 Prediabetes: Secondary | ICD-10-CM

## 2024-04-27 DIAGNOSIS — E538 Deficiency of other specified B group vitamins: Secondary | ICD-10-CM

## 2024-04-27 DIAGNOSIS — F411 Generalized anxiety disorder: Secondary | ICD-10-CM | POA: Diagnosis not present

## 2024-04-27 DIAGNOSIS — Z113 Encounter for screening for infections with a predominantly sexual mode of transmission: Secondary | ICD-10-CM

## 2024-04-27 DIAGNOSIS — E783 Hyperchylomicronemia: Secondary | ICD-10-CM | POA: Diagnosis not present

## 2024-04-27 DIAGNOSIS — E559 Vitamin D deficiency, unspecified: Secondary | ICD-10-CM

## 2024-04-27 DIAGNOSIS — I10 Essential (primary) hypertension: Secondary | ICD-10-CM

## 2024-04-27 MED ORDER — MICROLET LANCETS MISC: 1 refills | Status: AC

## 2024-04-27 MED ORDER — CONTOUR NEXT MONITOR W/DEVICE KIT: PACK | 0 refills | Status: AC

## 2024-04-27 MED ORDER — CONTOUR NEXT TEST VI STRP: ORAL_STRIP | 1 refills | Status: DC

## 2024-04-27 MED ORDER — CYANOCOBALAMIN 1000 MCG/ML IJ SOLN
1000.0000 ug | Freq: Once | INTRAMUSCULAR | Status: AC
  Administered 2024-04-27: 1000 ug via INTRAMUSCULAR

## 2024-04-27 MED ORDER — ROSUVASTATIN CALCIUM 5 MG PO TABS: 5.0000 mg | ORAL_TABLET | Freq: Every day | ORAL | 3 refills | Status: AC

## 2024-04-27 NOTE — Progress Notes (Signed)
 [facility] [address] [place], [place] [address]  Internal MEDICINE  Office Visit Note  Patient Name: Pam Khan  [id number]  [id number]  Date of Service: 04/27/2024  Chief Complaint  Patient presents with   Follow-up    Review labs   Gastroesophageal Reflux   Hypertension    HPI Pt is here for routine follow up -Taking 20mg  Lexapro  with wellbutrin  and doing well with this. Less brain fog. Still tired and no energy. But doesn't necessarily feel unrefreshed in AM. May still need to consider sleep study but will address labs first -Labs reviewed: Glucose elevated and has been previously, but A1c always normal. Will still check again.  -cholesterol elevated and will start medication now -B12 borderline and will restart injections and vit D low and start supplement -Completed flagyl  treatment course, will check vaginal swab for test of cure  Current Medication: Outpatient Encounter Medications as of 04/27/2024  Medication Sig   albuterol  (VENTOLIN  HFA) 108 (90 Base) MCG/ACT inhaler Inhale 2 puffs into the lungs every 6 (six) hours as needed for wheezing or shortness of breath.   Blood Glucose Monitoring Suppl (CONTOUR NEXT MONITOR) w/Device KIT Use as directed   buPROPion  (WELLBUTRIN  XL) 150 MG 24 hr tablet Take 1 tablet (150 mg total) by mouth daily.   diltiazem  (MATZIM LA ) 180 MG 24 hr tablet TAKE 1 TABLET BY MOUTH EVERY DAY   escitalopram  (LEXAPRO ) 20 MG tablet Take 1 tablet (20 mg total) by mouth daily.   famciclovir  (FAMVIR ) 500 MG tablet TAKE 1 TABLET BY MOUTH TWICE A DAY   omeprazole  (PRILOSEC) 40 MG capsule TAKE ONE CAPSULE BY MOUTH EVERY DAY FOR HEART BURN   rosuvastatin  (CRESTOR ) 5 MG tablet Take 1 tablet (5 mg total) by mouth daily.   triamterene -hydrochlorothiazide (MAXZIDE-25) 37.5-25 MG tablet TAKE 1 TABLET BY MOUTH EVERY DAY   [DISCONTINUED] glucose blood (CONTOUR NEXT TEST) test strip 1 each by Other route daily at 12 noon. Use as instructed    [DISCONTINUED] Microlet Lancets MISC by Does not apply route daily.   [DISCONTINUED] nitrofurantoin , macrocrystal-monohydrate, (MACROBID ) 100 MG capsule Take 1 capsule (100 mg total) by mouth 2 (two) times daily.   glucose blood (CONTOUR NEXT TEST) test strip Use as instructed once a daily Dx R30.0   Microlet Lancets MISC Use as directed once a daily for contour next meter  Dx R30.0   [EXPIRED] cyanocobalamin  (VITAMIN B12) injection 1,000 mcg    No facility-administered encounter medications on file as of 04/27/2024.    Surgical History: Past Surgical History:  Procedure Laterality Date   BACK SURGERY  1985   CESAREAN SECTION  1989   COSMETIC SURGERY  1985   head   CYSTOSCOPY WITH STENT PLACEMENT Left 05/17/2019   Procedure: CYSTOSCOPY WITH STENT PLACEMENT;  Surgeon: Penne Knee, MD;  Location: [facility];  Service: Urology;  Laterality: Left;   CYSTOSCOPY/URETEROSCOPY/HOLMIUM LASER/STENT PLACEMENT Left 05/25/2019   Procedure: CYSTOSCOPY/URETEROSCOPY/STENT Exchange;  Surgeon: Penne Knee, MD;  Location: [facility];  Service: Urology;  Laterality: Left;   DILATION AND CURETTAGE OF UTERUS  2007    Medical History: Past Medical History:  Diagnosis Date   Anxiety    Asthma    Barrett esophagus    DDD (degenerative disc disease), lumbar    Family history of adverse reaction to anesthesia    FATHER ALWAYS TROUBLE WAKING UP. WITH NECK SURGERY 2008/ASPIRATED EVENTUALLY DIED PNEUMONIA ETC   GERD (gastroesophageal reflux disease)    Hypertension  Spinal stenosis of lumbar region     Family History: Family History  Problem Relation Age of Onset   Breast cancer Mother 12   Breast cancer Maternal Grandmother 64   Diabetes Father    Heart attack Father     Social History   Socioeconomic History   Marital status: Married    Spouse name: Not on file   Number of children: Not on file   Years of education: Not on file   Highest education level: Not on file  Occupational History    Not on file  Tobacco Use   Smoking status: Never   Smokeless tobacco: Never  Vaping Use   Vaping status: Never Used  Substance and Sexual Activity   Alcohol use: Yes    Comment: occasionally   Drug use: No   Sexual activity: Not on file  Other Topics Concern   Not on file  Social History Narrative   Not on file   Social Drivers of Health   Financial Resource Strain: Not on file  Food Insecurity: Not on file  Transportation Needs: Not on file  Physical Activity: Not on file  Stress: Not on file  Social Connections: Not on file  Intimate Partner Violence: Not on file      Review of Systems  Constitutional:  Positive for fatigue. Negative for chills and unexpected weight change.  HENT:  Negative for congestion, rhinorrhea, sneezing and sore throat.   Eyes:  Negative for redness.  Respiratory:  Negative for cough, chest tightness and shortness of breath.   Cardiovascular:  Negative for chest pain and palpitations.  Gastrointestinal:  Negative for abdominal pain, constipation, diarrhea, nausea and vomiting.  Genitourinary:  Negative for dysuria and frequency.  Musculoskeletal:  Negative for arthralgias, back pain, joint swelling and neck pain.  Skin:  Negative for rash.  Neurological: Negative.  Negative for tremors and numbness.  Hematological:  Negative for adenopathy. Does not bruise/bleed easily.  Psychiatric/Behavioral:  Negative for behavioral problems (Depression), sleep disturbance and suicidal ideas. The patient is not nervous/anxious.     Vital Signs: BP 136/82 Comment: 141/90  Pulse 89   Temp 98.2 F (36.8 C)   Resp 16   Ht 5' 6 (1.676 m)   Wt 173 lb (78.5 kg)   SpO2 98%   BMI 27.92 kg/m    Physical Exam Vitals and nursing note reviewed.  Constitutional:      General: She is not in acute distress.    Appearance: She is well-developed and normal weight.  HENT:     Head: Normocephalic and atraumatic.   Eyes:     Extraocular Movements:  Extraocular movements intact.   Neck:     Thyroid: No thyromegaly.     Vascular: No JVD.     Trachea: No tracheal deviation.   Cardiovascular:     Rate and Rhythm: Normal rate and regular rhythm.     Heart sounds: Normal heart sounds. No murmur heard.    No friction rub. No gallop.  Pulmonary:     Effort: Pulmonary effort is normal.   Musculoskeletal:        General: Normal range of motion.  Lymphadenopathy:     Upper Body:     Left upper body: No axillary adenopathy.   Skin:    General: Skin is warm and dry.   Neurological:     Mental Status: She is alert and oriented to person, place, and time.   Psychiatric:  Behavior: Behavior normal.        Thought Content: Thought content normal.        Judgment: Judgment normal.        Assessment/Plan: 1. Essential hypertension (Primary) Stable, continue current medication  2. GAD (generalized anxiety disorder) Improving, continue lexapro  and wellbutrin   3. Prediabetes A1c at 5.7 will start monitoring and continue to work on diet and exercise - Blood Glucose Monitoring Suppl (CONTOUR NEXT MONITOR) w/Device KIT; Use as directed  Dispense: 1 kit; Refill: 0  4. Impaired fasting glucose  A1c 5.7 in prediabetic range now and will monitor  5. Mixed hyperglyceridemia Will start crestor , may start 2 days per week and then increase - rosuvastatin  (CRESTOR ) 5 MG tablet; Take 1 tablet (5 mg total) by mouth daily.  Dispense: 90 tablet; Refill: 3  6. Screen for STD (sexually transmitted disease) Pt performed self swab for test of cure from previous trich dx - NuSwab Vaginitis Plus (VG+)  7. B12 deficiency - cyanocobalamin  (VITAMIN B12) injection 1,000 mcg  8. Vitamin D  deficiency Start vit D supplement   General Counseling: Pam Khan verbalizes understanding of the findings of todays visit and agrees with plan of treatment. I have discussed any further diagnostic evaluation that may be needed or ordered today. We also  reviewed her medications today. she has been encouraged to call the office with any questions or concerns that should arise related to todays visit.    Orders Placed This Encounter  Procedures   NuSwab Vaginitis Plus (VG+)    Meds ordered this encounter  Medications   rosuvastatin  (CRESTOR ) 5 MG tablet    Sig: Take 1 tablet (5 mg total) by mouth daily.    Dispense:  90 tablet    Refill:  3   Blood Glucose Monitoring Suppl (CONTOUR NEXT MONITOR) w/Device KIT    Sig: Use as directed    Dispense:  1 kit    Refill:  0   glucose blood (CONTOUR NEXT TEST) test strip    Sig: Use as instructed once a daily Dx R30.0    Dispense:  100 each    Refill:  1   Microlet Lancets MISC    Sig: Use as directed once a daily for contour next meter  Dx R30.0    Dispense:  100 each    Refill:  1   cyanocobalamin  (VITAMIN B12) injection 1,000 mcg    This patient was seen by Tinnie Pro, PA-C in collaboration with Dr. Sigrid Bathe as a part of collaborative care agreement.   Total time spent:30 Minutes Time spent includes review of chart, medications, test results, and follow up plan with the patient.      Dr [name] Internal medicine

## 2024-05-01 LAB — NUSWAB VAGINITIS PLUS (VG+)
Candida albicans, NAA: NEGATIVE
Candida glabrata, NAA: NEGATIVE

## 2024-05-01 LAB — POCT GLYCOSYLATED HEMOGLOBIN (HGB A1C): Hemoglobin A1C: 5.7 % — AB (ref 4.0–5.6)

## 2024-05-06 ENCOUNTER — Other Ambulatory Visit: Payer: Self-pay | Admitting: Physician Assistant

## 2024-05-06 DIAGNOSIS — F411 Generalized anxiety disorder: Secondary | ICD-10-CM

## 2024-05-28 ENCOUNTER — Encounter: Payer: Self-pay | Admitting: Physician Assistant

## 2024-05-28 ENCOUNTER — Ambulatory Visit: Admitting: Physician Assistant

## 2024-05-28 VITALS — BP 136/80 | HR 94 | Temp 97.7°F | Resp 16 | Ht 66.0 in | Wt 170.6 lb

## 2024-05-28 DIAGNOSIS — E538 Deficiency of other specified B group vitamins: Secondary | ICD-10-CM | POA: Diagnosis not present

## 2024-05-28 DIAGNOSIS — R7303 Prediabetes: Secondary | ICD-10-CM | POA: Diagnosis not present

## 2024-05-28 DIAGNOSIS — E782 Mixed hyperlipidemia: Secondary | ICD-10-CM

## 2024-05-28 DIAGNOSIS — I10 Essential (primary) hypertension: Secondary | ICD-10-CM | POA: Diagnosis not present

## 2024-05-28 MED ORDER — TRIAMTERENE-HCTZ 37.5-25 MG PO TABS: 1.0000 | ORAL_TABLET | Freq: Every day | ORAL | 3 refills | Status: DC

## 2024-05-28 MED ORDER — CYANOCOBALAMIN 1000 MCG/ML IJ SOLN
1000.0000 ug | Freq: Once | INTRAMUSCULAR | Status: AC
  Administered 2024-05-28: 1000 ug via INTRAMUSCULAR

## 2024-05-28 MED ORDER — DILTIAZEM HCL ER 180 MG PO TB24: 180.0000 mg | ORAL_TABLET | Freq: Every day | ORAL | 3 refills | Status: DC

## 2024-05-28 NOTE — Progress Notes (Signed)
 [facility] [address] [place], [place] [address]  Internal MEDICINE  Office Visit Note  Patient Name: Pam Khan  [id number]  [id number]  Date of Service: 05/28/2024  Chief Complaint  Patient presents with   Gastroesophageal Reflux   Hypertension   Follow-up    HPI Pt is here for routine follow up -BP stable -BG in AM 130s -1.5 hr after BLT was 196 -2 eggs, 2 hours later was 142 -will set up the B12 injections monthly, will also start Vit D as she has not done so yet -taking crestor  daily and tolerating well  Current Medication: Outpatient Encounter Medications as of 05/28/2024  Medication Sig   albuterol  (VENTOLIN  HFA) 108 (90 Base) MCG/ACT inhaler Inhale 2 puffs into the lungs every 6 (six) hours as needed for wheezing or shortness of breath.   Blood Glucose Monitoring Suppl (CONTOUR NEXT MONITOR) w/Device KIT Use as directed   buPROPion  (WELLBUTRIN  XL) 150 MG 24 hr tablet TAKE 1 TABLET BY MOUTH EVERY DAY   diltiazem  (MATZIM LA ) 180 MG 24 hr tablet Take 1 tablet (180 mg total) by mouth daily.   escitalopram  (LEXAPRO ) 20 MG tablet Take 1 tablet (20 mg total) by mouth daily.   famciclovir  (FAMVIR ) 500 MG tablet TAKE 1 TABLET BY MOUTH TWICE A DAY   glucose blood (CONTOUR NEXT TEST) test strip Use as instructed once a daily Dx R30.0   Microlet Lancets MISC Use as directed once a daily for contour next meter  Dx R30.0   omeprazole  (PRILOSEC) 40 MG capsule TAKE ONE CAPSULE BY MOUTH EVERY DAY FOR HEART BURN   rosuvastatin  (CRESTOR ) 5 MG tablet Take 1 tablet (5 mg total) by mouth daily.   triamterene -hydrochlorothiazide (MAXZIDE-25) 37.5-25 MG tablet Take 1 tablet by mouth daily.   [DISCONTINUED] diltiazem  (MATZIM LA ) 180 MG 24 hr tablet TAKE 1 TABLET BY MOUTH EVERY DAY   [DISCONTINUED] triamterene -hydrochlorothiazide (MAXZIDE-25) 37.5-25 MG tablet TAKE 1 TABLET BY MOUTH EVERY DAY   [EXPIRED] cyanocobalamin  (VITAMIN B12) injection 1,000 mcg    No  facility-administered encounter medications on file as of 05/28/2024.    Surgical History: Past Surgical History:  Procedure Laterality Date   BACK SURGERY  1985   CESAREAN SECTION  1989   COSMETIC SURGERY  1985   head   CYSTOSCOPY WITH STENT PLACEMENT Left 05/17/2019   Procedure: CYSTOSCOPY WITH STENT PLACEMENT;  Surgeon: Penne Knee, MD;  Location: [facility];  Service: Urology;  Laterality: Left;   CYSTOSCOPY/URETEROSCOPY/HOLMIUM LASER/STENT PLACEMENT Left 05/25/2019   Procedure: CYSTOSCOPY/URETEROSCOPY/STENT Exchange;  Surgeon: Penne Knee, MD;  Location: [facility];  Service: Urology;  Laterality: Left;   DILATION AND CURETTAGE OF UTERUS  2007    Medical History: Past Medical History:  Diagnosis Date   Anxiety    Asthma    Barrett esophagus    DDD (degenerative disc disease), lumbar    Family history of adverse reaction to anesthesia    FATHER ALWAYS TROUBLE WAKING UP. WITH NECK SURGERY 2008/ASPIRATED EVENTUALLY DIED PNEUMONIA ETC   GERD (gastroesophageal reflux disease)    Hypertension    Spinal stenosis of lumbar region     Family History: Family History  Problem Relation Age of Onset   Breast cancer Mother 60   Breast cancer Maternal Grandmother 73   Diabetes Father    Heart attack Father     Social History   Socioeconomic History   Marital status: Married    Spouse name: Not on file   Number of children:  Not on file   Years of education: Not on file   Highest education level: Not on file  Occupational History   Not on file  Tobacco Use   Smoking status: Never   Smokeless tobacco: Never  Vaping Use   Vaping status: Never Used  Substance and Sexual Activity   Alcohol use: Yes    Comment: occasionally   Drug use: No   Sexual activity: Not on file  Other Topics Concern   Not on file  Social History Narrative   Not on file   Social Drivers of Health   Financial Resource Strain: Not on file  Food Insecurity: Not on file  Transportation Needs:  Not on file  Physical Activity: Not on file  Stress: Not on file  Social Connections: Not on file  Intimate Partner Violence: Not on file      Review of Systems  Constitutional:  Negative for chills and unexpected weight change.  HENT:  Negative for congestion, rhinorrhea, sneezing and sore throat.   Eyes:  Negative for redness.  Respiratory:  Negative for cough, chest tightness and shortness of breath.   Cardiovascular:  Negative for chest pain and palpitations.  Gastrointestinal:  Negative for abdominal pain, constipation, diarrhea, nausea and vomiting.  Genitourinary:  Negative for dysuria and frequency.  Musculoskeletal:  Negative for arthralgias, back pain, joint swelling and neck pain.  Skin:  Negative for rash.  Neurological: Negative.  Negative for tremors and numbness.  Hematological:  Negative for adenopathy. Does not bruise/bleed easily.  Psychiatric/Behavioral:  Negative for behavioral problems (Depression), sleep disturbance and suicidal ideas. The patient is not nervous/anxious.     Vital Signs: BP 136/80 Comment: 144/80  Pulse 94   Temp 97.7 F (36.5 C)   Resp 16   Ht 5' 6 (1.676 m)   Wt 170 lb 9.6 oz (77.4 kg)   SpO2 97%   BMI 27.54 kg/m    Physical Exam Vitals and nursing note reviewed.  Constitutional:      General: She is not in acute distress.    Appearance: She is well-developed and normal weight.  HENT:     Head: Normocephalic and atraumatic.  Eyes:     Extraocular Movements: Extraocular movements intact.  Neck:     Thyroid: No thyromegaly.     Vascular: No JVD.     Trachea: No tracheal deviation.  Cardiovascular:     Rate and Rhythm: Normal rate and regular rhythm.     Heart sounds: Normal heart sounds. No murmur heard.    No friction rub. No gallop.  Pulmonary:     Effort: Pulmonary effort is normal.  Musculoskeletal:        General: Normal range of motion.  Lymphadenopathy:     Upper Body:     Left upper body: No axillary  adenopathy.  Skin:    General: Skin is warm and dry.  Neurological:     Mental Status: She is alert and oriented to person, place, and time.  Psychiatric:        Behavior: Behavior normal.        Thought Content: Thought content normal.        Judgment: Judgment normal.        Assessment/Plan: 1. Primary hypertension (Primary) Stable, continue current medications - triamterene -hydrochlorothiazide (MAXZIDE-25) 37.5-25 MG tablet; Take 1 tablet by mouth daily.  Dispense: 90 tablet; Refill: 3 - diltiazem  (MATZIM LA ) 180 MG 24 hr tablet; Take 1 tablet (180 mg total) by mouth daily.  Dispense: 90 tablet; Refill: 3  2. Prediabetes Continue monitoring and working on diet and exercise  3. Mixed hyperlipidemia Tolerating crestor  and will continue  4. B12 deficiency - cyanocobalamin  (VITAMIN B12) injection 1,000 mcg   General Counseling: Alisa oakland understanding of the findings of todays visit and agrees with plan of treatment. I have discussed any further diagnostic evaluation that may be needed or ordered today. We also reviewed her medications today. she has been encouraged to call the office with any questions or concerns that should arise related to todays visit.    No orders of the defined types were placed in this encounter.   Meds ordered this encounter  Medications   triamterene -hydrochlorothiazide (MAXZIDE-25) 37.5-25 MG tablet    Sig: Take 1 tablet by mouth daily.    Dispense:  90 tablet    Refill:  3   diltiazem  (MATZIM LA ) 180 MG 24 hr tablet    Sig: Take 1 tablet (180 mg total) by mouth daily.    Dispense:  90 tablet    Refill:  3   cyanocobalamin  (VITAMIN B12) injection 1,000 mcg    This patient was seen by Tinnie Pro, PA-C in collaboration with Dr. Sigrid Bathe as a part of collaborative care agreement.   Total time spent:30 Minutes Time spent includes review of chart, medications, test results, and follow up plan with the patient.      Dr  [name] Internal medicine

## 2024-07-01 ENCOUNTER — Other Ambulatory Visit: Payer: Self-pay | Admitting: Physician Assistant

## 2024-07-02 ENCOUNTER — Ambulatory Visit (INDEPENDENT_AMBULATORY_CARE_PROVIDER_SITE_OTHER)

## 2024-07-02 DIAGNOSIS — E538 Deficiency of other specified B group vitamins: Secondary | ICD-10-CM

## 2024-07-02 MED ORDER — CYANOCOBALAMIN 1000 MCG/ML IJ SOLN
1000.0000 ug | Freq: Once | INTRAMUSCULAR | Status: AC
Start: 2024-07-02 — End: 2024-07-02
  Administered 2024-07-02: 1000 ug via INTRAMUSCULAR

## 2024-07-13 ENCOUNTER — Ambulatory Visit: Admitting: Physician Assistant

## 2024-07-13 ENCOUNTER — Encounter: Payer: Self-pay | Admitting: Physician Assistant

## 2024-07-13 VITALS — BP 142/80 | HR 98 | Temp 98.3°F | Resp 16 | Ht 66.0 in | Wt 173.6 lb

## 2024-07-13 DIAGNOSIS — I10 Essential (primary) hypertension: Secondary | ICD-10-CM

## 2024-07-13 DIAGNOSIS — R42 Dizziness and giddiness: Secondary | ICD-10-CM

## 2024-07-13 DIAGNOSIS — J4520 Mild intermittent asthma, uncomplicated: Secondary | ICD-10-CM | POA: Diagnosis not present

## 2024-07-13 DIAGNOSIS — R7303 Prediabetes: Secondary | ICD-10-CM

## 2024-07-13 DIAGNOSIS — Z113 Encounter for screening for infections with a predominantly sexual mode of transmission: Secondary | ICD-10-CM

## 2024-07-13 LAB — POCT GLYCOSYLATED HEMOGLOBIN (HGB A1C): Hemoglobin A1C: 5.8 % — AB (ref 4.0–5.6)

## 2024-07-13 NOTE — Progress Notes (Signed)
 [facility] [address] [place], [place] [address]  Internal MEDICINE  Office Visit Note  Patient Name: Pam Khan  [id number]  [id number]  Date of Service: 07/13/2024  Chief Complaint  Patient presents with   Follow-up   Gastroesophageal Reflux   Hypertension    HPI Pt is here for routine follow up with acute concerns about sugar recently -BG rising -160-200 in AM now -187 a few hours after eating -dizzy and lightheaded and nausea in PM, just feels off sometimes -tried drinking electrolytes thinking maybe dehydrated -discussed possible low blood sugar episodes and will try to space out and eat small amounts more frequently while still being mindful of food options -when exercising will feel tightness in lungs, hx of exercise induced asthma. Albuterol  does help. Seems to be happening more as she tries to do better with exercising to help sugars. Will go ahead and evaluate further with PFT and she is interested in seeing pulmonology to establish care  -No CP or palpitations, denies worsening anxiety though is more anxious in office today to discuss sugar changes. Her dad became diabetic and she is worried about having this happen -BG this morning 163, has not eaten yet today -did discuss possibility of adding medication like metformin if sugars rising, but given concern for possible low sugar events will hold for now since A1c stable -In June/July went to bariatric clinic and was prescribed phentermine and didn't really help weight loss and did have to take it very early to not impact sleep at night so no longer taking this. May consider GLP1 in future to help sugar and wt loss potentially -pt would like to check vaginal swab today for STD screening  Current Medication: Outpatient Encounter Medications as of 07/13/2024  Medication Sig   albuterol  (VENTOLIN  HFA) 108 (90 Base) MCG/ACT inhaler Inhale 2 puffs into the lungs every 6 (six) hours as needed for wheezing or  shortness of breath.   Blood Glucose Monitoring Suppl (CONTOUR NEXT MONITOR) w/Device KIT Use as directed   buPROPion  (WELLBUTRIN  XL) 150 MG 24 hr tablet TAKE 1 TABLET BY MOUTH EVERY DAY   CONTOUR NEXT TEST test strip USE AS INSTRUCTED ONCE A DAILY DX R30.0   diltiazem  (MATZIM LA ) 180 MG 24 hr tablet Take 1 tablet (180 mg total) by mouth daily.   escitalopram  (LEXAPRO ) 20 MG tablet Take 1 tablet (20 mg total) by mouth daily.   famciclovir  (FAMVIR ) 500 MG tablet TAKE 1 TABLET BY MOUTH TWICE A DAY   Microlet Lancets MISC Use as directed once a daily for contour next meter  Dx R30.0   omeprazole  (PRILOSEC) 40 MG capsule TAKE ONE CAPSULE BY MOUTH EVERY DAY FOR HEART BURN   rosuvastatin  (CRESTOR ) 5 MG tablet Take 1 tablet (5 mg total) by mouth daily.   triamterene -hydrochlorothiazide (MAXZIDE-25) 37.5-25 MG tablet Take 1 tablet by mouth daily.   No facility-administered encounter medications on file as of 07/13/2024.    Surgical History: Past Surgical History:  Procedure Laterality Date   BACK SURGERY  1985   CESAREAN SECTION  1989   COSMETIC SURGERY  1985   head   CYSTOSCOPY WITH STENT PLACEMENT Left 05/17/2019   Procedure: CYSTOSCOPY WITH STENT PLACEMENT;  Surgeon: Penne Knee, MD;  Location: [facility];  Service: Urology;  Laterality: Left;   CYSTOSCOPY/URETEROSCOPY/HOLMIUM LASER/STENT PLACEMENT Left 05/25/2019   Procedure: CYSTOSCOPY/URETEROSCOPY/STENT Exchange;  Surgeon: Penne Knee, MD;  Location: [facility];  Service: Urology;  Laterality: Left;   DILATION AND CURETTAGE  OF UTERUS  2007    Medical History: Past Medical History:  Diagnosis Date   Anxiety    Asthma    Barrett esophagus    DDD (degenerative disc disease), lumbar    Family history of adverse reaction to anesthesia    FATHER ALWAYS TROUBLE WAKING UP. WITH NECK SURGERY 2008/ASPIRATED EVENTUALLY DIED PNEUMONIA ETC   GERD (gastroesophageal reflux disease)    Hypertension    Spinal stenosis of lumbar region      Family History: Family History  Problem Relation Age of Onset   Breast cancer Mother 26   Breast cancer Maternal Grandmother 49   Diabetes Father    Heart attack Father     Social History   Socioeconomic History   Marital status: Married    Spouse name: Not on file   Number of children: Not on file   Years of education: Not on file   Highest education level: Not on file  Occupational History   Not on file  Tobacco Use   Smoking status: Never   Smokeless tobacco: Never  Vaping Use   Vaping status: Never Used  Substance and Sexual Activity   Alcohol use: Yes    Comment: occasionally   Drug use: No   Sexual activity: Not on file  Other Topics Concern   Not on file  Social History Narrative   Not on file   Social Drivers of Health   Financial Resource Strain: Not on file  Food Insecurity: Not on file  Transportation Needs: Not on file  Physical Activity: Not on file  Stress: Not on file  Social Connections: Not on file  Intimate Partner Violence: Not on file      Review of Systems  Constitutional:  Negative for chills and unexpected weight change.  HENT:  Negative for congestion, rhinorrhea, sneezing and sore throat.   Eyes:  Negative for redness.  Respiratory:  Negative for cough, chest tightness and shortness of breath.   Cardiovascular:  Negative for chest pain and palpitations.  Gastrointestinal:  Negative for abdominal pain, constipation, diarrhea and vomiting.  Genitourinary:  Negative for dysuria and frequency.  Musculoskeletal:  Negative for arthralgias, back pain, joint swelling and neck pain.  Skin:  Negative for rash.  Neurological:  Positive for light-headedness. Negative for tremors and numbness.  Hematological:  Negative for adenopathy. Does not bruise/bleed easily.  Psychiatric/Behavioral:  Negative for behavioral problems (Depression), sleep disturbance and suicidal ideas. The patient is not nervous/anxious.     Vital Signs: BP (!)  142/80   Pulse 98   Temp 98.3 F (36.8 C)   Resp 16   Ht 5' 6 (1.676 m)   Wt 173 lb 9.6 oz (78.7 kg)   SpO2 98%   BMI 28.02 kg/m    Physical Exam Vitals and nursing note reviewed.  Constitutional:      General: She is not in acute distress.    Appearance: She is well-developed and normal weight.  HENT:     Head: Normocephalic and atraumatic.  Eyes:     Extraocular Movements: Extraocular movements intact.  Neck:     Thyroid: No thyromegaly.     Vascular: No JVD.     Trachea: No tracheal deviation.  Cardiovascular:     Rate and Rhythm: Normal rate and regular rhythm.     Heart sounds: Normal heart sounds. No murmur heard.    No friction rub. No gallop.  Pulmonary:     Effort: Pulmonary effort is normal.  Musculoskeletal:  General: Normal range of motion.  Lymphadenopathy:     Upper Body:     Left upper body: No axillary adenopathy.  Skin:    General: Skin is warm and dry.  Neurological:     Mental Status: She is alert and oriented to person, place, and time.  Psychiatric:        Thought Content: Thought content normal.        Judgment: Judgment normal.     Comments: Verge of tears discussing sugar changes        Assessment/Plan: 1. Prediabetes (Primary) - POCT HgB A1C is 5.8 which is up from 5.7 last check but overall stable. May be experiencing more fluctuations due to trying to change diet and exercise to help. Encouraged to eat small meals at more frequent intervals to help prevent lows. May consider metformin or GLP1 in future to help BG control  2. Dizzy spells Encouraged to eat and hydrate at regularly intervals. Monitor BG and BP. Will check labs. Call or go to ED if new or worsening symptoms arise - Comprehensive metabolic panel with GFR - CBC w/Diff/Platelet  3. Essential hypertension Borderline in office and will monitor  4. Mild intermittent asthma without complication Having to use albuterol  whenever she tried to exercise, will update  PFT and would like to establish with pulm - Pulmonary Function Test; Future  5. Screening for STDs (sexually transmitted diseases) Pt completed self-swab and will await results - NuSwab Vaginitis Plus (VG+)   General Counseling: Alisa oakland understanding of the findings of todays visit and agrees with plan of treatment. I have discussed any further diagnostic evaluation that may be needed or ordered today. We also reviewed her medications today. she has been encouraged to call the office with any questions or concerns that should arise related to todays visit.    Orders Placed This Encounter  Procedures   Comprehensive metabolic panel with GFR   CBC w/Diff/Platelet   NuSwab Vaginitis Plus (VG+)   POCT HgB A1C   Pulmonary Function Test    No orders of the defined types were placed in this encounter.   This patient was seen by Tinnie Pro, PA-C in collaboration with Dr. Sigrid Bathe as a part of collaborative care agreement.   Total time spent:30 Minutes Time spent includes review of chart, medications, test results, and follow up plan with the patient.      Dr [name] Internal medicine

## 2024-07-14 LAB — COMPREHENSIVE METABOLIC PANEL WITH GFR
ALT: 37 IU/L — ABNORMAL HIGH (ref 0–32)
AST: 27 IU/L (ref 0–40)
Albumin: 4.7 g/dL (ref 3.8–4.9)
Alkaline Phosphatase: 122 IU/L — ABNORMAL HIGH (ref 44–121)
BUN/Creatinine Ratio: 14 (ref 9–23)
BUN: 14 mg/dL (ref 6–24)
Bilirubin Total: 0.6 mg/dL (ref 0.0–1.2)
CO2: 22 mmol/L (ref 20–29)
Calcium: 10.5 mg/dL — ABNORMAL HIGH (ref 8.7–10.2)
Chloride: 95 mmol/L — ABNORMAL LOW (ref 96–106)
Creatinine, Ser: 1 mg/dL (ref 0.57–1.00)
Globulin, Total: 3 g/dL (ref 1.5–4.5)
Glucose: 139 mg/dL — ABNORMAL HIGH (ref 70–99)
Potassium: 4.7 mmol/L (ref 3.5–5.2)
Sodium: 135 mmol/L (ref 134–144)
Total Protein: 7.7 g/dL (ref 6.0–8.5)
eGFR: 67 mL/min/1.73 (ref >59)

## 2024-07-14 LAB — CBC WITH DIFFERENTIAL/PLATELET
Basophils Absolute: 0.1 x10E3/uL (ref 0.0–0.2)
Basos: 1 %
EOS (ABSOLUTE): 0.1 x10E3/uL (ref 0.0–0.4)
Eos: 1 %
Hematocrit: 45.4 % (ref 34.0–46.6)
Hemoglobin: 15 g/dL (ref 11.1–15.9)
Immature Grans (Abs): 0 x10E3/uL (ref 0.0–0.1)
Immature Granulocytes: 0 %
Lymphocytes Absolute: 1.5 x10E3/uL (ref 0.7–3.1)
Lymphs: 18 %
MCH: 31.2 pg (ref 26.6–33.0)
MCHC: 33 g/dL (ref 31.5–35.7)
MCV: 94 fL (ref 79–97)
Monocytes Absolute: 0.6 x10E3/uL (ref 0.1–0.9)
Monocytes: 7 %
Neutrophils Absolute: 6.1 x10E3/uL (ref 1.4–7.0)
Neutrophils: 72 %
Platelets: 306 x10E3/uL (ref 150–450)
RBC: 4.81 x10E6/uL (ref 3.77–5.28)
RDW: 12.4 % (ref 11.7–15.4)
WBC: 8.4 x10E3/uL (ref 3.4–10.8)

## 2024-07-16 ENCOUNTER — Ambulatory Visit: Payer: Self-pay | Admitting: Physician Assistant

## 2024-07-17 LAB — NUSWAB VAGINITIS PLUS (VG+)
Chlamydia trachomatis, NAA: NEGATIVE
Neisseria gonorrhoeae, NAA: NEGATIVE
Trich vag by NAA: NEGATIVE

## 2024-07-17 MED ORDER — METRONIDAZOLE 500 MG PO TABS: 500.0000 mg | ORAL_TABLET | Freq: Two times a day (BID) | ORAL | 0 refills | Status: DC

## 2024-07-17 NOTE — Telephone Encounter (Signed)
-----   Message from Tinnie MARLA Pro sent at 07/16/2024  9:57 AM EDT ----- Please let her know that her calcium  was high. Is she taking supplement? If so will need to stop/decrease any calcium  supplement if she is taking. We will need to monitor this. Also stay hydrated ----- Message ----- From: Almer Bi, CMA Sent: 07/13/2024   9:48 AM EDT To: Tinnie MARLA Pro, PA-C

## 2024-07-17 NOTE — Telephone Encounter (Signed)
LVM for patient regarding lab results.

## 2024-07-30 ENCOUNTER — Ambulatory Visit: Admitting: Physician Assistant

## 2024-08-08 ENCOUNTER — Encounter: Admitting: Internal Medicine

## 2024-08-09 ENCOUNTER — Encounter: Payer: Commercial Managed Care - PPO | Admitting: Physician Assistant

## 2024-08-16 ENCOUNTER — Ambulatory Visit (INDEPENDENT_AMBULATORY_CARE_PROVIDER_SITE_OTHER): Admitting: Physician Assistant

## 2024-08-16 ENCOUNTER — Encounter: Payer: Self-pay | Admitting: Physician Assistant

## 2024-08-16 VITALS — BP 130/80 | HR 89 | Temp 98.0°F | Resp 16 | Ht 66.0 in | Wt 165.0 lb

## 2024-08-16 DIAGNOSIS — Z1231 Encounter for screening mammogram for malignant neoplasm of breast: Secondary | ICD-10-CM

## 2024-08-16 DIAGNOSIS — E538 Deficiency of other specified B group vitamins: Secondary | ICD-10-CM

## 2024-08-16 DIAGNOSIS — I10 Essential (primary) hypertension: Secondary | ICD-10-CM | POA: Diagnosis not present

## 2024-08-16 DIAGNOSIS — Z0001 Encounter for general adult medical examination with abnormal findings: Secondary | ICD-10-CM

## 2024-08-16 DIAGNOSIS — J4520 Mild intermittent asthma, uncomplicated: Secondary | ICD-10-CM | POA: Diagnosis not present

## 2024-08-16 MED ORDER — CYANOCOBALAMIN 1000 MCG/ML IJ SOLN: 1000.0000 ug | INTRAMUSCULAR | 3 refills | Status: AC

## 2024-08-16 MED ORDER — SYRINGE 23G X 1" 3 ML MISC: 3 refills | Status: AC

## 2024-08-16 MED ORDER — ALBUTEROL SULFATE HFA 108 (90 BASE) MCG/ACT IN AERS: 2.0000 | INHALATION_SPRAY | Freq: Four times a day (QID) | RESPIRATORY_TRACT | 3 refills | Status: AC | PRN

## 2024-08-16 MED ORDER — CYANOCOBALAMIN 1000 MCG/ML IJ SOLN
1000.0000 ug | Freq: Once | INTRAMUSCULAR | Status: AC
  Administered 2024-08-16: 1000 ug via INTRAMUSCULAR

## 2024-08-16 NOTE — Progress Notes (Signed)
 [facility] [address] [place], [place] [address]  Internal MEDICINE  Office Visit Note  Patient Name: Pam Khan  [id number]  [id number]  Date of Service: 08/16/2024  Chief Complaint  Patient presents with   Annual Exam   Hypertension   Gastroesophageal Reflux     HPI Pt is here for routine health maintenance examination -doing well today, no concerns. Does need letter stating she was seen for physical today for her work. -labs previously reviewed over the phone and will need to monitor elevated calcium , will repeat CMP for monitoring. Did discuss recent vit D supplement may have led to increase and may need to adjust pending repeat labs. Also discussed other meds can contribute and will monitor -BP stable -would like to have script for future b12 to do at home/work as she has someone who can assist her with this -dizzy/lightheaded spells better now -does need albuterol  refill and will be having PFT and pulmonary consult due to worsening asthma symptoms recently -mammogram due in Dec  Current Medication: Outpatient Encounter Medications as of 08/16/2024  Medication Sig   Blood Glucose Monitoring Suppl (CONTOUR NEXT MONITOR) w/Device KIT Use as directed   buPROPion  (WELLBUTRIN  XL) 150 MG 24 hr tablet TAKE 1 TABLET BY MOUTH EVERY DAY   CONTOUR NEXT TEST test strip USE AS INSTRUCTED ONCE A DAILY DX R30.0   diltiazem  (MATZIM LA ) 180 MG 24 hr tablet Take 1 tablet (180 mg total) by mouth daily.   escitalopram  (LEXAPRO ) 20 MG tablet Take 1 tablet (20 mg total) by mouth daily.   famciclovir  (FAMVIR ) 500 MG tablet TAKE 1 TABLET BY MOUTH TWICE A DAY   Microlet Lancets MISC Use as directed once a daily for contour next meter  Dx R30.0   omeprazole  (PRILOSEC) 40 MG capsule TAKE ONE CAPSULE BY MOUTH EVERY DAY FOR HEART BURN   rosuvastatin  (CRESTOR ) 5 MG tablet Take 1 tablet (5 mg total) by mouth daily.   triamterene -hydrochlorothiazide (MAXZIDE-25) 37.5-25 MG tablet  Take 1 tablet by mouth daily.   [DISCONTINUED] albuterol  (VENTOLIN  HFA) 108 (90 Base) MCG/ACT inhaler Inhale 2 puffs into the lungs every 6 (six) hours as needed for wheezing or shortness of breath.   [DISCONTINUED] cyanocobalamin  (VITAMIN B12) 1000 MCG/ML injection Inject 1,000 mcg into the muscle every 30 (thirty) days.   [DISCONTINUED] Syringe/Needle, Disp, (SYRINGE 3CC/23GX1) 23G X 1 3 ML MISC by Does not apply route every 30 (thirty) days.   albuterol  (VENTOLIN  HFA) 108 (90 Base) MCG/ACT inhaler Inhale 2 puffs into the lungs every 6 (six) hours as needed for wheezing or shortness of breath.   cyanocobalamin  (VITAMIN B12) 1000 MCG/ML injection Inject 1 mL (1,000 mcg total) into the muscle every 30 (thirty) days.   Syringe/Needle, Disp, (SYRINGE 3CC/23GX1) 23G X 1 3 ML MISC Use as directed with B12 injection every 30 days   [DISCONTINUED] metroNIDAZOLE  (FLAGYL ) 500 MG tablet Take 1 tablet (500 mg total) by mouth 2 (two) times daily.   [EXPIRED] cyanocobalamin  (VITAMIN B12) injection 1,000 mcg    No facility-administered encounter medications on file as of 08/16/2024.    Surgical History: Past Surgical History:  Procedure Laterality Date   BACK SURGERY  1985   CESAREAN SECTION  1989   COSMETIC SURGERY  1985   head   CYSTOSCOPY WITH STENT PLACEMENT Left 05/17/2019   Procedure: CYSTOSCOPY WITH STENT PLACEMENT;  Surgeon: Penne Knee, MD;  Location: [facility];  Service: Urology;  Laterality: Left;   CYSTOSCOPY/URETEROSCOPY/HOLMIUM LASER/STENT PLACEMENT Left  05/25/2019   Procedure: CYSTOSCOPY/URETEROSCOPY/STENT Exchange;  Surgeon: Penne Knee, MD;  Location: [facility] OR[facility];  Service: Urology;  Laterality: Left;   DILATION AND CURETTAGE OF UTERUS  2007    Medical History: Past Medical History:  Diagnosis Date   Anxiety    Asthma    Barrett esophagus    DDD (degenerative disc disease), lumbar    Family history of adverse reaction to anesthesia    FATHER ALWAYS TROUBLE WAKING UP. WITH  NECK SURGERY 2008/ASPIRATED EVENTUALLY DIED PNEUMONIA ETC   GERD (gastroesophageal reflux disease)    Hypertension    Spinal stenosis of lumbar region     Family History: Family History  Problem Relation Age of Onset   Breast cancer Mother 31   Breast cancer Maternal Grandmother 58   Diabetes Father    Heart attack Father       Review of Systems  Constitutional:  Negative for chills and unexpected weight change.  HENT:  Negative for congestion, rhinorrhea, sneezing and sore throat.   Eyes:  Negative for redness.  Respiratory:  Negative for cough, chest tightness and shortness of breath.   Cardiovascular:  Negative for chest pain and palpitations.  Gastrointestinal:  Negative for abdominal pain, constipation, diarrhea and vomiting.  Genitourinary:  Negative for dysuria and frequency.  Musculoskeletal:  Negative for arthralgias, back pain, joint swelling and neck pain.  Skin:  Negative for rash.  Neurological:  Negative for tremors and numbness.  Hematological:  Negative for adenopathy. Does not bruise/bleed easily.  Psychiatric/Behavioral:  Negative for behavioral problems (Depression), sleep disturbance and suicidal ideas. The patient is not nervous/anxious.      Vital Signs: BP 130/80   Pulse 89   Temp 98 F (36.7 C)   Resp 16   Ht 5' 6 (1.676 m)   Wt 165 lb (74.8 kg)   SpO2 97%   BMI 26.63 kg/m    Physical Exam Vitals and nursing note reviewed.  Constitutional:      General: She is not in acute distress.    Appearance: Normal appearance. She is well-developed and normal weight.  HENT:     Head: Normocephalic and atraumatic.     Mouth/Throat:     Mouth: Mucous membranes are moist.     Pharynx: No oropharyngeal exudate.  Eyes:     Extraocular Movements: Extraocular movements intact.  Neck:     Thyroid: No thyromegaly.     Vascular: No JVD.     Trachea: No tracheal deviation.  Cardiovascular:     Rate and Rhythm: Normal rate and regular rhythm.      Heart sounds: Normal heart sounds. No murmur heard.    No friction rub. No gallop.  Pulmonary:     Effort: Pulmonary effort is normal.  Abdominal:     Palpations: Abdomen is soft.     Tenderness: There is no abdominal tenderness.  Musculoskeletal:        General: Normal range of motion.     Cervical back: Normal range of motion.  Lymphadenopathy:     Upper Body:     Left upper body: No axillary adenopathy.  Skin:    General: Skin is warm and dry.  Neurological:     Mental Status: She is alert and oriented to person, place, and time.  Psychiatric:        Behavior: Behavior normal.        Thought Content: Thought content normal.        Judgment: Judgment normal.  LABS: Recent Results (from the past 2160 hours)  POCT HgB A1C     Status: Abnormal   Collection Time: 07/13/24  9:48 AM  Result Value Ref Range   Hemoglobin A1C 5.8 (A) 4.0 - 5.6 %   HbA1c POC (<> result, manual entry)     HbA1c, POC (prediabetic range)     HbA1c, POC (controlled diabetic range)    NuSwab Vaginitis Plus (VG+)     Status: Abnormal   Collection Time: 07/13/24  9:48 AM  Result Value Ref Range   Atopobium vaginae High - 2 (A) Score   BVAB 2 High - 2 (A) Score   Megasphaera 1 Low - 0 Score    Comment: Calculate total score by adding the 3 individual bacterial vaginosis (BV) marker scores together.  Total score is interpreted as follows: Total score 0-1: Indicates the absence of BV. Total score   2: Indeterminate for BV. Additional clinical                  data should be evaluated to establish a                  diagnosis. Total score 3-6: Indicates the presence of BV.    Candida albicans, NAA Negative Negative   Candida glabrata, NAA Negative Negative   Trich vag by NAA Negative Negative   Chlamydia trachomatis, NAA Negative Negative   Neisseria gonorrhoeae, NAA Negative Negative  Comprehensive metabolic panel with GFR     Status: Abnormal   Collection Time: 07/13/24 10:30 AM  Result  Value Ref Range   Glucose 139 (H) 70 - 99 mg/dL   BUN 14 6 - 24 mg/dL   Creatinine, Ser 8.99 0.57 - 1.00 mg/dL   eGFR 67 >40 fO/fpw/8.26   BUN/Creatinine Ratio 14 9 - 23   Sodium 135 134 - 144 mmol/L   Potassium 4.7 3.5 - 5.2 mmol/L   Chloride 95 (L) 96 - 106 mmol/L   CO2 22 20 - 29 mmol/L   Calcium  10.5 (H) 8.7 - 10.2 mg/dL   Total Protein 7.7 6.0 - 8.5 g/dL   Albumin 4.7 3.8 - 4.9 g/dL   Globulin, Total 3.0 1.5 - 4.5 g/dL   Bilirubin Total 0.6 0.0 - 1.2 mg/dL   Alkaline Phosphatase 122 (H) 44 - 121 IU/L    Comment: **Effective July 23, 2024 Alkaline Phosphatase**   reference interval will be changing to:              Age                Female          Female           0 -  5 days         47 - 127       47 - 127           6 - 10 days         29 - 242       29 - 242          11 - 20 days        109 - 357      109 - 357          21 - 30 days         94 - 494       94 - 494  1 -  2 months      149 - 539      149 - 539           3 -  6 months      131 - 452      131 - 452           7 - 11 months      117 - 401      117 - 401   12 months -  6 years       158 - 369      158 - 369           7 - 12 years       150 - 409      150 - 409               13 years       156 - 435       78 - 227               14 years       114 - 375       64 - 161               15 years        88 - 279       56 - 134               16 years        74 - 207       51 - 121               17 years        63 - 161       47 - 113          18 - 20 years        51 - 125       42 - 106          21 - 50 years         47 - 123       41 - 116          51 - 80 years        49 - 135       51 - 125              >80 years        48 - 129       48 - 129    AST 27 0 - 40 IU/L   ALT 37 (H) 0 - 32 IU/L  CBC w/Diff/Platelet     Status: None   Collection Time: 07/13/24 10:30 AM  Result Value Ref Range   WBC 8.4 3.4 - 10.8 x10E3/uL   RBC 4.81 3.77 - 5.28 x10E6/uL   Hemoglobin 15.0 11.1 - 15.9 g/dL   Hematocrit  54.5 65.9 - 46.6 %   MCV 94 79 - 97 fL   MCH 31.2 26.6 - 33.0 pg   MCHC 33.0 31.5 - 35.7 g/dL   RDW 87.5 88.2 - 84.5 %   Platelets 306 150 - 450 x10E3/uL   Neutrophils 72 Not Estab. %   Lymphs 18 Not Estab. %   Monocytes 7 Not Estab. %   Eos 1 Not Estab. %   Basos 1 Not Estab. %   Neutrophils Absolute 6.1 1.4 - 7.0 x10E3/uL  Lymphocytes Absolute 1.5 0.7 - 3.1 x10E3/uL   Monocytes Absolute 0.6 0.1 - 0.9 x10E3/uL   EOS (ABSOLUTE) 0.1 0.0 - 0.4 x10E3/uL   Basophils Absolute 0.1 0.0 - 0.2 x10E3/uL   Immature Granulocytes 0 Not Estab. %   Immature Grans (Abs) 0.0 0.0 - 0.1 x10E3/uL        Assessment/Plan: 1. Encounter for general adult medical examination with abnormal findings (Primary) CPE performed, labs reviewed, mammogram due in Dec, UTD on cologuard and will need repeat pap in Jan  2. Essential hypertension Stable, continue current medication  3. Mild intermittent asthma without complication Will be having PFT and pulm visit soon - albuterol  (VENTOLIN  HFA) 108 (90 Base) MCG/ACT inhaler; Inhale 2 puffs into the lungs every 6 (six) hours as needed for wheezing or shortness of breath.  Dispense: 18 g; Refill: 3  4. Visit for screening mammogram - MM 3D SCREENING MAMMOGRAM BILATERAL BREAST; Future  5. Serum calcium  elevated Will recheck labs - Comprehensive metabolic panel with GFR  6. B12 deficiency - cyanocobalamin  (VITAMIN B12) injection 1,000 mcg   General Counseling: Alisa verbalizes understanding of the findings of todays visit and agrees with plan of treatment. I have discussed any further diagnostic evaluation that may be needed or ordered today. We also reviewed her medications today. she has been encouraged to call the office with any questions or concerns that should arise related to todays visit.    Counseling:    Orders Placed This Encounter  Procedures   MM 3D SCREENING MAMMOGRAM BILATERAL BREAST   Comprehensive metabolic panel with GFR    Meds  ordered this encounter  Medications   albuterol  (VENTOLIN  HFA) 108 (90 Base) MCG/ACT inhaler    Sig: Inhale 2 puffs into the lungs every 6 (six) hours as needed for wheezing or shortness of breath.    Dispense:  18 g    Refill:  3   cyanocobalamin  (VITAMIN B12) injection 1,000 mcg   cyanocobalamin  (VITAMIN B12) 1000 MCG/ML injection    Sig: Inject 1 mL (1,000 mcg total) into the muscle every 30 (thirty) days.    Dispense:  1 mL    Refill:  3   Syringe/Needle, Disp, (SYRINGE 3CC/23GX1) 23G X 1 3 ML MISC    Sig: Use as directed with B12 injection every 30 days    Dispense:  1 each    Refill:  3    This patient was seen by Tinnie Pro, PA-C in collaboration with Dr. Sigrid Bathe as a part of collaborative care agreement.  Total time spent:35 Minutes  Time spent includes review of chart, medications, test results, and follow up plan with the patient.     Sigrid CHRISTELLA Bathe, MD  Internal Medicine

## 2024-08-21 ENCOUNTER — Ambulatory Visit: Admitting: Internal Medicine

## 2024-08-22 ENCOUNTER — Ambulatory Visit: Admitting: Internal Medicine

## 2024-08-22 DIAGNOSIS — J4520 Mild intermittent asthma, uncomplicated: Secondary | ICD-10-CM | POA: Diagnosis not present

## 2024-08-27 NOTE — Procedures (Signed)
 [facility] [address] [place], [address]    Complete Pulmonary Function Testing Interpretation:  FINDINGS:  Forced vital capacity is normal.  FEV1 is normal.  FEV1 FVC ratio is normal.  Total lung capacity is normal.  Residual volume is decreased.  FRC is decreased.  DLCO is within normal limits.  Postbronchodilator there was no significant change in the FEV1  IMPRESSION:  This pulmonary function study is within normal limits clinical correlation is recommended.  Pam [name], MD [facility] Pulmonary Critical Care Medicine Sleep Medicine

## 2024-08-28 ENCOUNTER — Ambulatory Visit: Admitting: Internal Medicine

## 2024-08-29 LAB — PULMONARY FUNCTION TEST

## 2024-09-10 ENCOUNTER — Encounter: Payer: Self-pay | Admitting: Internal Medicine

## 2024-09-10 ENCOUNTER — Ambulatory Visit (INDEPENDENT_AMBULATORY_CARE_PROVIDER_SITE_OTHER): Admitting: Internal Medicine

## 2024-09-10 VITALS — BP 125/75 | HR 90 | Temp 98.0°F | Resp 16 | Ht 66.0 in | Wt 154.0 lb

## 2024-09-10 DIAGNOSIS — K219 Gastro-esophageal reflux disease without esophagitis: Secondary | ICD-10-CM | POA: Diagnosis not present

## 2024-09-10 DIAGNOSIS — R058 Other specified cough: Secondary | ICD-10-CM | POA: Diagnosis not present

## 2024-09-10 DIAGNOSIS — R0602 Shortness of breath: Secondary | ICD-10-CM

## 2024-09-10 NOTE — Progress Notes (Signed)
 [facility] [address] [place], [place] [address]  Pulmonary Sleep Medicine   Office Visit Note  Patient Name: Pam Khan DOB: [birth year] MRN [id number]  Date of Service: 09/10/2024  Complaints/HPI: She states she has had breathng difficulty going on for some years. She was told she may have exercise induced asthma and also in cold weather. Patient notes difficulty expanding her lungs. She states like an elephant on her chest. She states she does have cough. She also has reflux. She has not had a MBS. She also notes she gets choking when she laughs. Currently she is only on albuterol  for her breathing. She states she does not smoke ever. She has no smokers in the family. She has 3 dogs at home. She has traveled only to [facility] not elsewhere. She does not note wheeze  Office Spirometry Results:     ROS  General: (-) fever, (-) chills, (-) night sweats, (-) weakness Skin: (-) rashes, (-) itching,. Eyes: (-) visual changes, (-) redness, (-) itching. Nose and Sinuses: (-) nasal stuffiness or itchiness, (-) postnasal drip, (-) nosebleeds, (-) sinus trouble. Mouth and Throat: (-) sore throat, (-) hoarseness. Neck: (-) swollen glands, (-) enlarged thyroid, (-) neck pain. Respiratory: + cough, (-) bloody sputum, + shortness of breath, - wheezing. Cardiovascular: - ankle swelling, (-) chest pain. Lymphatic: (-) lymph node enlargement. Neurologic: (-) numbness, (-) tingling. Psychiatric: (-) anxiety, (-) depression   Current Medication: Outpatient Encounter Medications as of 09/10/2024  Medication Sig   albuterol  (VENTOLIN  HFA) 108 (90 Base) MCG/ACT inhaler Inhale 2 puffs into the lungs every 6 (six) hours as needed for wheezing or shortness of breath.   Blood Glucose Monitoring Suppl (CONTOUR NEXT MONITOR) w/Device KIT Use as directed   buPROPion  (WELLBUTRIN  XL) 150 MG 24 hr tablet TAKE 1 TABLET BY MOUTH EVERY DAY   CONTOUR NEXT TEST test strip USE AS INSTRUCTED ONCE A  DAILY DX R30.0   cyanocobalamin  (VITAMIN B12) 1000 MCG/ML injection Inject 1 mL (1,000 mcg total) into the muscle every 30 (thirty) days.   diltiazem  (MATZIM LA ) 180 MG 24 hr tablet Take 1 tablet (180 mg total) by mouth daily.   escitalopram  (LEXAPRO ) 20 MG tablet Take 1 tablet (20 mg total) by mouth daily.   famciclovir  (FAMVIR ) 500 MG tablet TAKE 1 TABLET BY MOUTH TWICE A DAY   Microlet Lancets MISC Use as directed once a daily for contour next meter  Dx R30.0   omeprazole  (PRILOSEC) 40 MG capsule TAKE ONE CAPSULE BY MOUTH EVERY DAY FOR HEART BURN   rosuvastatin  (CRESTOR ) 5 MG tablet Take 1 tablet (5 mg total) by mouth daily.   Syringe/Needle, Disp, (SYRINGE 3CC/23GX1) 23G X 1 3 ML MISC Use as directed with B12 injection every 30 days   triamterene -hydrochlorothiazide (MAXZIDE-25) 37.5-25 MG tablet Take 1 tablet by mouth daily.   No facility-administered encounter medications on file as of 09/10/2024.    Surgical History: Past Surgical History:  Procedure Laterality Date   BACK SURGERY  1985   CESAREAN SECTION  1989   COSMETIC SURGERY  1985   head   CYSTOSCOPY WITH STENT PLACEMENT Left 05/17/2019   Procedure: CYSTOSCOPY WITH STENT PLACEMENT;  Surgeon: Penne Knee, MD;  Location: [facility];  Service: Urology;  Laterality: Left;   CYSTOSCOPY/URETEROSCOPY/HOLMIUM LASER/STENT PLACEMENT Left 05/25/2019   Procedure: CYSTOSCOPY/URETEROSCOPY/STENT Exchange;  Surgeon: Penne Knee, MD;  Location: [facility];  Service: Urology;  Laterality: Left;   DILATION AND CURETTAGE OF UTERUS  2007  Medical History: Past Medical History:  Diagnosis Date   Anxiety    Asthma    Barrett esophagus    DDD (degenerative disc disease), lumbar    Family history of adverse reaction to anesthesia    FATHER ALWAYS TROUBLE WAKING UP. WITH NECK SURGERY 2008/ASPIRATED EVENTUALLY DIED PNEUMONIA ETC   GERD (gastroesophageal reflux disease)    Hypertension    Spinal stenosis of lumbar region     Family  History: Family History  Problem Relation Age of Onset   Breast cancer Mother 74   Breast cancer Maternal Grandmother 51   Diabetes Father    Heart attack Father     Social History: Social History   Socioeconomic History   Marital status: Divorced    Spouse name: Not on file   Number of children: Not on file   Years of education: Not on file   Highest education level: Not on file  Occupational History   Not on file  Tobacco Use   Smoking status: Never   Smokeless tobacco: Never  Vaping Use   Vaping status: Never Used  Substance and Sexual Activity   Alcohol use: Yes    Comment: occasionally   Drug use: No   Sexual activity: Not on file  Other Topics Concern   Not on file  Social History Narrative   Not on file   Social Drivers of Health   Financial Resource Strain: Not on file  Food Insecurity: Not on file  Transportation Needs: Not on file  Physical Activity: Not on file  Stress: Not on file  Social Connections: Not on file  Intimate Partner Violence: Not on file    Vital Signs: Blood pressure 125/75, pulse 90, temperature 98 F (36.7 C), resp. rate 16, height 5' 6 (1.676 m), weight 154 lb 0.3 oz (69.9 kg), SpO2 98%.  Examination: General Appearance: The patient is well-developed, well-nourished, and in no distress. Skin: Gross inspection of skin unremarkable. Head: normocephalic, no gross deformities. Eyes: no gross deformities noted. ENT: ears appear grossly normal no exudates. Neck: Supple. No thyromegaly. No LAD. Respiratory: no rhonchi noted. Cardiovascular: Normal S1 and S2 without murmur or rub. Extremities: No cyanosis. pulses are equal. Neurologic: Alert and oriented. No involuntary movements.  LABS: Recent Results (from the past 2160 hours)  POCT HgB A1C     Status: Abnormal   Collection Time: 07/13/24  9:48 AM  Result Value Ref Range   Hemoglobin A1C 5.8 (A) 4.0 - 5.6 %   HbA1c POC (<> result, manual entry)     HbA1c, POC (prediabetic  range)     HbA1c, POC (controlled diabetic range)    NuSwab Vaginitis Plus (VG+)     Status: Abnormal   Collection Time: 07/13/24  9:48 AM  Result Value Ref Range   Atopobium vaginae High - 2 (A) Score   BVAB 2 High - 2 (A) Score   Megasphaera 1 Low - 0 Score    Comment: Calculate total score by adding the 3 individual bacterial vaginosis (BV) marker scores together.  Total score is interpreted as follows: Total score 0-1: Indicates the absence of BV. Total score   2: Indeterminate for BV. Additional clinical                  data should be evaluated to establish a                  diagnosis. Total score 3-6: Indicates the presence of BV.  Candida albicans, NAA Negative Negative   Candida glabrata, NAA Negative Negative   Trich vag by NAA Negative Negative   Chlamydia trachomatis, NAA Negative Negative   Neisseria gonorrhoeae, NAA Negative Negative  Comprehensive metabolic panel with GFR     Status: Abnormal   Collection Time: 07/13/24 10:30 AM  Result Value Ref Range   Glucose 139 (H) 70 - 99 mg/dL   BUN 14 6 - 24 mg/dL   Creatinine, Ser 8.99 0.57 - 1.00 mg/dL   eGFR 67 >40 fO/fpw/8.26   BUN/Creatinine Ratio 14 9 - 23   Sodium 135 134 - 144 mmol/L   Potassium 4.7 3.5 - 5.2 mmol/L   Chloride 95 (L) 96 - 106 mmol/L   CO2 22 20 - 29 mmol/L   Calcium  10.5 (H) 8.7 - 10.2 mg/dL   Total Protein 7.7 6.0 - 8.5 g/dL   Albumin 4.7 3.8 - 4.9 g/dL   Globulin, Total 3.0 1.5 - 4.5 g/dL   Bilirubin Total 0.6 0.0 - 1.2 mg/dL   Alkaline Phosphatase 122 (H) 44 - 121 IU/L    Comment: **Effective July 23, 2024 Alkaline Phosphatase**   reference interval will be changing to:              Age                Female          Female           0 -  5 days         47 - 127       47 - 127           6 - 10 days         29 - 242       29 - 242          11 - 20 days        109 - 357      109 - 357          21 - 30 days         94 - 494       94 - 494           1 -  2 months      149 - 539       149 - 539           3 -  6 months      131 - 452      131 - 452           7 - 11 months      117 - 401      117 - 401   12 months -  6 years       158 - 369      158 - 369           7 - 12 years       150 - 409      150 - 409               13 years       156 - 435       78 - 227               14 years       114 - 375       64 - 161  15 years        88 - 279       56 - 134               16 years        74 - 207       51 - 121               17 years        63 - 161       47 - 113          18 - 20 years        51 - 125       42 - 106          21 - 50 years         47 - 123       41 - 116          51 - 80 years        49 - 135       51 - 125              >80 years        48 - 129       48 - 129    AST 27 0 - 40 IU/L   ALT 37 (H) 0 - 32 IU/L  CBC w/Diff/Platelet     Status: None   Collection Time: 07/13/24 10:30 AM  Result Value Ref Range   WBC 8.4 3.4 - 10.8 x10E3/uL   RBC 4.81 3.77 - 5.28 x10E6/uL   Hemoglobin 15.0 11.1 - 15.9 g/dL   Hematocrit 54.5 65.9 - 46.6 %   MCV 94 79 - 97 fL   MCH 31.2 26.6 - 33.0 pg   MCHC 33.0 31.5 - 35.7 g/dL   RDW 87.5 88.2 - 84.5 %   Platelets 306 150 - 450 x10E3/uL   Neutrophils 72 Not Estab. %   Lymphs 18 Not Estab. %   Monocytes 7 Not Estab. %   Eos 1 Not Estab. %   Basos 1 Not Estab. %   Neutrophils Absolute 6.1 1.4 - 7.0 x10E3/uL   Lymphocytes Absolute 1.5 0.7 - 3.1 x10E3/uL   Monocytes Absolute 0.6 0.1 - 0.9 x10E3/uL   EOS (ABSOLUTE) 0.1 0.0 - 0.4 x10E3/uL   Basophils Absolute 0.1 0.0 - 0.2 x10E3/uL   Immature Granulocytes 0 Not Estab. %   Immature Grans (Abs) 0.0 0.0 - 0.1 x10E3/uL  Pulmonary Function Test     Status: None   Collection Time: 08/29/24  3:39 PM  Result Value Ref Range   FEV1     FVC     FEV1/FVC     TLC     DLCO      Radiology: MM 3D SCREENING MAMMOGRAM BILATERAL BREAST Result Date: 11/14/2023 CLINICAL DATA:  Screening. EXAM: DIGITAL SCREENING BILATERAL MAMMOGRAM WITH TOMOSYNTHESIS AND CAD TECHNIQUE:  Bilateral screening digital craniocaudal and mediolateral oblique mammograms were obtained. Bilateral screening digital breast tomosynthesis was performed. The images were evaluated with computer-aided detection. COMPARISON:  Previous exam(s). ACR Breast Density Category b: There are scattered areas of fibroglandular density. FINDINGS: There are no findings suspicious for malignancy. IMPRESSION: No mammographic evidence of malignancy. A result letter of this screening mammogram will be mailed directly to the patient. RECOMMENDATION: Screening mammogram in one year. (Code:SM-B-01Y) BI-RADS CATEGORY  1: Negative. Electronically Signed   By: Leita Mattocks M.D.   On: 11/14/2023  08:41    No results found.  No results found.  Assessment and Plan: Patient Active Problem List   Diagnosis Date Noted   Low back pain 05/12/2021   Encounter for general adult medical examination with abnormal findings 08/02/2020   Screening for colon cancer 08/02/2020   Mild intermittent asthma without complication 06/01/2020   Encounter for screening mammogram for malignant neoplasm of breast 06/01/2020   Hypercalcemia 09/09/2019   Impaired renal function 09/09/2019   Vitamin D  deficiency 09/09/2019   Encounter for prescription of oral contraceptives 07/03/2019   Vaginal candidiasis 07/03/2019   Spinal stenosis of lumbar region 07/03/2019   Generalized anxiety disorder 07/03/2019   Fever blister 07/03/2019   Dysuria 07/03/2019   Sepsis (HCC) 05/17/2019   Acquired trigger finger 06/05/2015   Lateral epicondylitis 06/05/2015   Dyslipidemia 02/14/2014   GERD (gastroesophageal reflux disease) 02/14/2014   Essential hypertension 02/14/2014    1. Shortness of breath (Primary) She had a PFT done and this was within normal limits - Pulmonary function test; Future  2. Other cough Unclear if she has allergy issues with her dogs but will get environmental allergy panel done - Allergy Test  3. GERD without  esophagitis She has reflux could be causing her cough MBS ordered - SLP modified barium swallow; Future   General Counseling: I have discussed the findings of the evaluation and examination with Pam Khan.  I have also discussed any further diagnostic evaluation thatmay be needed or ordered today. Pam Khan verbalizes understanding of the findings of todays visit. We also reviewed her medications today and discussed drug interactions and side effects including but not limited excessive drowsiness and altered mental states. We also discussed that there is always a risk not just to her but also people around her. she has been encouraged to call the office with any questions or concerns that should arise related to todays visit.  No orders of the defined types were placed in this encounter.    Time spent: 39  I have personally obtained a history, examined the patient, evaluated laboratory and imaging results, formulated the assessment and plan and placed orders.    Elfreda [name], MD [facility] Pulmonary and Critical Care Sleep medicine

## 2024-09-10 NOTE — Patient Instructions (Signed)
 Asthma, Adult  Asthma is a condition that causes swelling and narrowing of the airways. These are the passages that lead from the nose and mouth down into the lungs. When asthma symptoms get worse it is called an asthma attack or flare. This can make it hard to breathe. Asthma flares can range from minor to life-threatening. There is no cure for asthma, but medicines and lifestyle changes can help to control it. What are the causes? It is not known exactly what causes asthma, but certain things can cause asthma symptoms to get worse (triggers). What can trigger an asthma attack? Cigarette smoke. Mold. Dust. Your pet's skin flakes (dander). Cockroaches. Pollen. Air pollution (like household cleaners, wood smoke, smog, or Therapist, occupational). What are the signs or symptoms? Trouble breathing (shortness of breath). Coughing. Making high-pitched whistling sounds when you breathe, most often when you breathe out (wheezing). Chest tightness. Tiredness with little activity. Poor exercise tolerance. How is this treated? Controller medicines that help prevent asthma symptoms. Fast-acting reliever or rescue medicines. These give short-term relief of asthma symptoms. Allergy medicines if your attacks are brought on by allergens. Medicines to help control the body's defense (immune) system. Staying away from the things that cause asthma attacks. Follow these instructions at home: Avoiding triggers in your home Do not allow anyone to smoke in your home. Limit use of fireplaces and wood stoves. Get rid of pests (such as roaches and mice) and their droppings. Keep your home clean. Clean your floors. Dust regularly. Use cleaning products that do not smell. Wash bed sheets and blankets every week in hot water. Dry them in a dryer. Have someone vacuum when you are not home. Change your heating and air conditioning filters often. Use blankets that are made of polyester or cotton. General  instructions Take over-the-counter and prescription medicines only as told by your doctor. Do not smoke or use any products that contain nicotine or tobacco. If you need help quitting, ask your doctor. Stay away from secondhand smoke. Avoid doing things outdoors when allergen counts are high and when air quality is low. Warm up before you exercise. Take time to cool down after exercise. Use a peak flow meter as told by your doctor. A peak flow meter is a tool that measures how well your lungs are working. Keep track of the peak flow meter's readings. Write them down. Follow your asthma action plan. This is a written plan for taking care of your asthma and treating your attacks. Make sure you get all the shots (vaccines) that your doctor recommends. Ask your doctor about a flu shot and a pneumonia shot. Keep all follow-up visits. Contact a doctor if: You have wheezing, shortness of breath, or a cough even while taking medicine to prevent attacks. The mucus you cough up (sputum) is thicker than usual. The mucus you cough up changes from clear or white to yellow, green, gray, or is bloody. You have problems from the medicine you are taking, such as: A rash. Itching. Swelling. Trouble breathing. You need reliever medicines more than 2-3 times a week. Your peak flow reading is still at 50-79% of your personal best after following the action plan for 1 hour. You have a fever. Get help right away if: You seem to be worse and are not responding to medicine during an asthma attack. You are short of breath even at rest. You get short of breath when doing very little activity. You have trouble eating, drinking, or talking. You have chest  pain or tightness. You have a fast heartbeat. Your lips or fingernails start to turn blue. You are light-headed or dizzy, or you faint. Your peak flow is less than 50% of your personal best. You feel too tired to breathe normally. These symptoms may be an  emergency. Get help right away. Call 911. Do not wait to see if the symptoms will go away. Do not drive yourself to the hospital. Summary Asthma is a long-term (chronic) condition in which the airways get tight and narrow. An asthma attack can make it hard to breathe. Asthma cannot be cured, but medicines and lifestyle changes can help control it. Make sure you understand how to avoid triggers and how and when to use your medicines. Avoid things that can cause allergy symptoms (allergens). These include animal skin flakes (dander) and pollen from trees or grass. Avoid things that pollute the air. These may include household cleaners, wood smoke, smog, or chemical odors. This information is not intended to replace advice given to you by your health care provider. Make sure you discuss any questions you have with your health care provider. Document Revised: 08/03/2021 Document Reviewed: 08/03/2021 Elsevier Patient Education  2024 [employer].

## 2024-09-12 ENCOUNTER — Telehealth: Payer: Self-pay | Admitting: Internal Medicine

## 2024-09-12 ENCOUNTER — Other Ambulatory Visit: Payer: Self-pay | Admitting: Internal Medicine

## 2024-09-12 DIAGNOSIS — K219 Gastro-esophageal reflux disease without esophagitis: Secondary | ICD-10-CM

## 2024-09-12 NOTE — Telephone Encounter (Signed)
 Notified patient of Barium Swallow appointment date, arrival time, location and no restrictions-Pam Khan

## 2024-09-25 ENCOUNTER — Other Ambulatory Visit: Payer: Self-pay | Admitting: Physician Assistant

## 2024-09-26 ENCOUNTER — Ambulatory Visit
Admission: RE | Admit: 2024-09-26 | Discharge: 2024-09-26 | Disposition: A | Discharge status: Home / Self Care | Source: Ambulatory Visit | Attending: Internal Medicine | Admitting: Internal Medicine

## 2024-09-26 DIAGNOSIS — K219 Gastro-esophageal reflux disease without esophagitis: Secondary | ICD-10-CM | POA: Insufficient documentation

## 2024-09-26 NOTE — Progress Notes (Addendum)
 Modified Barium Swallow Study  Patient Details  Name: Pam Khan MRN: [id number] Date of Birth: 09-05-1971  Today's Date: 09/26/2024  Modified Barium Swallow completed.  Full report located under Chart Review in the Imaging Section.  History of Present Illness Pt is a 53 yo female w/ PMH including GERD, report of Barrett's Esophagus by pt, HTN, Anxiety, pre-DM, spinal stenosis. Pt endorsed c/o REFLUX s/s. She is on a PPI c1x daily urrently.  Pt reported No weight loss; No neurological dxs.   Clinical Impression Patient presents with functional oropharyngeal phase swallowing in setting of Known c/o REFLUX, Esophageal phase dysmotility. ANY Esophageal phase dysmotility can impact the oropharyngeal phase of swallowing. No aspiration nor laryngeal penetration noted during this study.  Oral phase is characterized by adequate lip closure, bolus preparation and containment, mastication of solids, and anterior to posterior transit. Swallow initiation occurs primarily at the level of the BOT>valleculae.  Pharyngeal phase is noted for adequate tongue base retraction, adequate hyolaryngeal excursion, and adequate pharyngeal constriction. Pharyngeal stripping wave is complete. Epiglottic inversion is complete during trials w/ timely/tight epiglottic inversion/closure. No laryngeal penetration nor aspiration occurred during all trials given. No pharyngeal residue noted.  Amplitude/duration of cricopharyngeus opening appeared [facility]. There was adequate/complete clearance through the upper cervical Esophagus. No overt bolus stasis noted in the cervical Esophagus(viewable at level of the shoulders) but min bony protrusions/bumpy presentation noted in the viewable cervical Esophagus.   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 Pt was educated on the results of the study immediately after. Viewed study together and answered questions. Factors that may increase risk of  adverse event in presence of aspiration Noe & Lianne 2021): GI disease   Swallow Evaluation Recommendations Recommendations: PO diet PO Diet Recommendation: Regular; Thin liquids (Level 0) = moisten foods well and avoid problematic foods Liquid Administration via: Cup; Straw (less straw use d/t air swallowed) Medication Administration: Whole meds with liquid (vs whole in puree if desired) Supervision: Patient able to self-feed Swallowing strategies: Minimize environmental distractions; Slow rate;Small bites/sips; Follow solids with liquids Postural changes: Position pt fully upright for meals; Stay upright 30-60 min after meals; Out of bed for meals (GERD precs.) Oral care recommendations: Oral care BID (2x/day); Pt independent with oral care Recommended consults: Consider GI consultation; Consider esophageal assessment       Comer Portugal, MS, CCC-SLP Speech Language Pathologist Rehab Services; [facility] [phone] (ascom) Melquiades Kovar 09/26/2024,6:46 PM

## 2024-09-27 ENCOUNTER — Encounter: Payer: Self-pay | Admitting: Nurse Practitioner

## 2024-09-27 ENCOUNTER — Ambulatory Visit (INDEPENDENT_AMBULATORY_CARE_PROVIDER_SITE_OTHER): Admitting: Nurse Practitioner

## 2024-09-27 VITALS — BP 114/70 | HR 81 | Temp 96.1°F | Resp 16 | Ht 66.0 in | Wt 152.0 lb

## 2024-09-27 DIAGNOSIS — N390 Urinary tract infection, site not specified: Secondary | ICD-10-CM | POA: Diagnosis not present

## 2024-09-27 DIAGNOSIS — R300 Dysuria: Secondary | ICD-10-CM | POA: Diagnosis not present

## 2024-09-27 DIAGNOSIS — R319 Hematuria, unspecified: Secondary | ICD-10-CM | POA: Diagnosis not present

## 2024-09-27 DIAGNOSIS — B379 Candidiasis, unspecified: Secondary | ICD-10-CM | POA: Diagnosis not present

## 2024-09-27 LAB — POCT URINALYSIS DIPSTICK
Bilirubin, UA: NEGATIVE
Glucose, UA: NEGATIVE
Nitrite, UA: NEGATIVE
Protein, UA: NEGATIVE
Spec Grav, UA: 1.01 (ref 1.010–1.025)
Urobilinogen, UA: 0.2 U/dL
pH, UA: 7 (ref 5.0–8.0)

## 2024-09-27 MED ORDER — PHENAZOPYRIDINE HCL 200 MG PO TABS: 200.0000 mg | ORAL_TABLET | Freq: Three times a day (TID) | ORAL | 0 refills | Status: AC | PRN

## 2024-09-27 MED ORDER — CIPROFLOXACIN HCL 500 MG PO TABS: 500.0000 mg | ORAL_TABLET | Freq: Two times a day (BID) | ORAL | 0 refills | Status: AC

## 2024-09-27 MED ORDER — FLUCONAZOLE 150 MG PO TABS: 150.0000 mg | ORAL_TABLET | Freq: Once | ORAL | 0 refills | Status: AC

## 2024-09-27 NOTE — Progress Notes (Signed)
 [facility] [address] [place], [place] [address]  Internal MEDICINE  Office Visit Note  Patient Name: Pam Khan  [id number]  [id number]  Date of Service: 09/27/2024  Chief Complaint  Patient presents with   Acute Visit    uti     HPI [name] presents for an acute sick visit for possible UTI --onset of symptoms was about 4 days ago. --reports frequency, urgency, burning with urination. Denies back pain, flank pain.  Urinalysis positive for moderate leukocytes, trace blood and negative for nitrites.      Current Medication:  Outpatient Encounter Medications as of 09/27/2024  Medication Sig   ciprofloxacin  (CIPRO ) 500 MG tablet Take 1 tablet (500 mg total) by mouth 2 (two) times daily for 5 days. May take with food.   fluconazole  (DIFLUCAN ) 150 MG tablet Take 1 tablet (150 mg total) by mouth once for 1 dose. May take an additional dose after 3 days if still symptomatic.   phenazopyridine  (PYRIDIUM ) 200 MG tablet Take 1 tablet (200 mg total) by mouth 3 (three) times daily as needed (urinary tract pain/bladder spasms.). Take one tab po bid prn for bladder spasm   albuterol  (VENTOLIN  HFA) 108 (90 Base) MCG/ACT inhaler Inhale 2 puffs into the lungs every 6 (six) hours as needed for wheezing or shortness of breath.   Blood Glucose Monitoring Suppl (CONTOUR NEXT MONITOR) w/Device KIT Use as directed   buPROPion  (WELLBUTRIN  XL) 150 MG 24 hr tablet TAKE 1 TABLET BY MOUTH EVERY DAY   CONTOUR NEXT TEST test strip USE AS INSTRUCTED ONCE A DAILY DX R30.0   cyanocobalamin  (VITAMIN B12) 1000 MCG/ML injection Inject 1 mL (1,000 mcg total) into the muscle every 30 (thirty) days.   diltiazem  (MATZIM LA ) 180 MG 24 hr tablet Take 1 tablet (180 mg total) by mouth daily.   escitalopram  (LEXAPRO ) 20 MG tablet Take 1 tablet (20 mg total) by mouth daily.   famciclovir  (FAMVIR ) 500 MG tablet TAKE 1 TABLET BY MOUTH TWICE A DAY   Microlet Lancets MISC Use as directed once a daily for  contour next meter  Dx R30.0   omeprazole  (PRILOSEC) 40 MG capsule TAKE ONE CAPSULE BY MOUTH EVERY DAY FOR HEART BURN   rosuvastatin  (CRESTOR ) 5 MG tablet Take 1 tablet (5 mg total) by mouth daily.   Syringe/Needle, Disp, (SYRINGE 3CC/23GX1) 23G X 1 3 ML MISC Use as directed with B12 injection every 30 days   triamterene -hydrochlorothiazide (MAXZIDE-25) 37.5-25 MG tablet Take 1 tablet by mouth daily.   No facility-administered encounter medications on file as of 09/27/2024.      Medical History: Past Medical History:  Diagnosis Date   Anxiety    Asthma    Barrett esophagus    DDD (degenerative disc disease), lumbar    Family history of adverse reaction to anesthesia    FATHER ALWAYS TROUBLE WAKING UP. WITH NECK SURGERY 2008/ASPIRATED EVENTUALLY DIED PNEUMONIA ETC   GERD (gastroesophageal reflux disease)    Hypertension    Spinal stenosis of lumbar region      Vital Signs: BP 114/70   Pulse 81   Temp (!) 96.1 F (35.6 C)   Resp 16   Ht 5' 6 (1.676 m)   Wt 152 lb (68.9 kg)   SpO2 99%   BMI 24.53 kg/m    Review of Systems  Constitutional:  Positive for fatigue. Negative for appetite change, chills, fever and unexpected weight change.  HENT: Negative.    Respiratory: Negative.  Negative for  cough, chest tightness, shortness of breath and wheezing.   Cardiovascular: Negative.  Negative for chest pain and palpitations.  Gastrointestinal: Negative.   Genitourinary:  Positive for difficulty urinating, dysuria and flank pain. Negative for frequency, urgency, vaginal bleeding, vaginal discharge and vaginal pain.    Physical Exam Vitals reviewed.  Constitutional:      General: She is not in acute distress.    Appearance: Normal appearance. She is not ill-appearing.  HENT:     Head: Normocephalic and atraumatic.  Eyes:     Pupils: Pupils are equal, round, and reactive to light.  Cardiovascular:     Rate and Rhythm: Normal rate and regular rhythm.  Pulmonary:      Effort: Pulmonary effort is normal. No respiratory distress.  Abdominal:     Tenderness: There is abdominal tenderness in the suprapubic area.  Neurological:     Mental Status: She is alert and oriented to person, place, and time.  Psychiatric:        Mood and Affect: Mood normal.        Behavior: Behavior normal.       Assessment/Plan: 1. Urinary tract infection with hematuria, site unspecified (Primary) Abnormal urinalysis, urine sent for culture. Cipro  prescribed, take until gone.  - CULTURE, URINE COMPREHENSIVE - ciprofloxacin  (CIPRO ) 500 MG tablet; Take 1 tablet (500 mg total) by mouth 2 (two) times daily for 5 days. May take with food.  Dispense: 10 tablet; Refill: 0  2. Antibiotic-induced yeast infection Fluconazole  prescribed if needed.  - fluconazole  (DIFLUCAN ) 150 MG tablet; Take 1 tablet (150 mg total) by mouth once for 1 dose. May take an additional dose after 3 days if still symptomatic.  Dispense: 3 tablet; Refill: 0  3. Dysuria Urinalysis abnormal, urine sent for culture. May take phenazopyridine as needed.  - POCT urinalysis dipstick - phenazopyridine (PYRIDIUM) 200 MG tablet; Take 1 tablet (200 mg total) by mouth 3 (three) times daily as needed (urinary tract pain/bladder spasms.). Take one tab po bid prn for bladder spasm  Dispense: 15 tablet; Refill: 0   General Counseling: Alisa oakland understanding of the findings of todays visit and agrees with plan of treatment. I have discussed any further diagnostic evaluation that may be needed or ordered today. We also reviewed her medications today. she has been encouraged to call the office with any questions or concerns that should arise related to todays visit.    Counseling:    Orders Placed This Encounter  Procedures   CULTURE, URINE COMPREHENSIVE   POCT urinalysis dipstick    Meds ordered this encounter  Medications   ciprofloxacin  (CIPRO ) 500 MG tablet    Sig: Take 1 tablet (500 mg total) by mouth 2  (two) times daily for 5 days. May take with food.    Dispense:  10 tablet    Refill:  0    Fill new script today.   fluconazole  (DIFLUCAN ) 150 MG tablet    Sig: Take 1 tablet (150 mg total) by mouth once for 1 dose. May take an additional dose after 3 days if still symptomatic.    Dispense:  3 tablet    Refill:  0    Fill new script today.   phenazopyridine (PYRIDIUM) 200 MG tablet    Sig: Take 1 tablet (200 mg total) by mouth 3 (three) times daily as needed (urinary tract pain/bladder spasms.). Take one tab po bid prn for bladder spasm    Dispense:  15 tablet    Refill:  0  Fill new script today    Return if symptoms worsen or fail to improve, for keep regular scheduled visit with lauren pcp. .  [place] Controlled Substance Database was reviewed by me for overdose risk score (ORS)  Time spent:30 Minutes Time spent with patient included reviewing progress notes, labs, imaging studies, and discussing plan for follow up.   This patient was seen by Mardy Maxin, FNP-C in collaboration with Dr. Sigrid Bathe as a part of collaborative care agreement.  Tabias Swayze R. Maxin, MSN, FNP-C Internal Medicine

## 2024-10-02 LAB — CULTURE, URINE COMPREHENSIVE

## 2024-11-11 ENCOUNTER — Other Ambulatory Visit: Payer: Self-pay | Admitting: Physician Assistant

## 2024-11-11 DIAGNOSIS — F411 Generalized anxiety disorder: Secondary | ICD-10-CM

## 2024-11-12 ENCOUNTER — Ambulatory Visit: Admitting: Internal Medicine

## 2024-11-19 ENCOUNTER — Ambulatory Visit: Admitting: Physician Assistant

## 2024-11-22 ENCOUNTER — Ambulatory Visit

## 2024-11-22 ENCOUNTER — Encounter: Payer: Self-pay | Admitting: Physician Assistant

## 2024-11-22 ENCOUNTER — Ambulatory Visit: Admitting: Physician Assistant

## 2024-11-22 VITALS — BP 110/65 | HR 97 | Temp 98.0°F | Resp 16 | Ht 66.0 in | Wt 140.6 lb

## 2024-11-22 DIAGNOSIS — R319 Hematuria, unspecified: Secondary | ICD-10-CM | POA: Diagnosis not present

## 2024-11-22 DIAGNOSIS — L821 Other seborrheic keratosis: Secondary | ICD-10-CM | POA: Diagnosis not present

## 2024-11-22 DIAGNOSIS — N390 Urinary tract infection, site not specified: Secondary | ICD-10-CM

## 2024-11-22 DIAGNOSIS — Z113 Encounter for screening for infections with a predominantly sexual mode of transmission: Secondary | ICD-10-CM

## 2024-11-22 DIAGNOSIS — N760 Acute vaginitis: Secondary | ICD-10-CM | POA: Diagnosis not present

## 2024-11-22 DIAGNOSIS — R300 Dysuria: Secondary | ICD-10-CM

## 2024-11-22 DIAGNOSIS — B9689 Other specified bacterial agents as the cause of diseases classified elsewhere: Secondary | ICD-10-CM | POA: Diagnosis not present

## 2024-11-22 DIAGNOSIS — L820 Inflamed seborrheic keratosis: Secondary | ICD-10-CM

## 2024-11-22 DIAGNOSIS — L814 Other melanin hyperpigmentation: Secondary | ICD-10-CM

## 2024-11-22 DIAGNOSIS — D1801 Hemangioma of skin and subcutaneous tissue: Secondary | ICD-10-CM

## 2024-11-22 DIAGNOSIS — Z1283 Encounter for screening for malignant neoplasm of skin: Secondary | ICD-10-CM | POA: Diagnosis not present

## 2024-11-22 DIAGNOSIS — L720 Epidermal cyst: Secondary | ICD-10-CM | POA: Diagnosis not present

## 2024-11-22 LAB — POCT URINALYSIS DIPSTICK
Bilirubin, UA: NEGATIVE
Glucose, UA: NEGATIVE
Ketones, UA: POSITIVE
Nitrite, UA: NEGATIVE
Protein, UA: NEGATIVE
Spec Grav, UA: 1.01
Urobilinogen, UA: 0.2 U/dL
pH, UA: 5

## 2024-11-22 MED ORDER — TRETINOIN 0.025 % EX CREA: TOPICAL_CREAM | Freq: Every day | CUTANEOUS | 5 refills | Status: AC

## 2024-11-22 MED ORDER — METRONIDAZOLE 500 MG PO TABS: 500.0000 mg | ORAL_TABLET | Freq: Three times a day (TID) | ORAL | 0 refills | Status: AC

## 2024-11-22 NOTE — Patient Instructions (Signed)

## 2024-11-22 NOTE — Progress Notes (Signed)
 "   Subjective   Pam Khan is a 54 y.o. female who presents for the following: Lesion(s) of concern . Patient is new patient  Today patient reports: LOC on abdomen  LOC groin  LOC on back  Review of Systems:    No other skin or systemic complaints except as noted in HPI or Assessment and Plan.  The following portions of the chart were reviewed this encounter and updated as appropriate: medications, allergies, medical history  Relevant Medical History:  n/a   Objective  (SKPE) Well appearing patient in no apparent distress; mood and affect are within normal limits. Examination was performed of the: Focused Exam of: Back, Abdomen, Face   Examination notable for: Angioma(s): Scattered red vascular papule(s)  , Lentigo/lentigines: Scattered pigmented macules that are tan to brown in color and are somewhat non-uniform in shape and concentrated in the sun-exposed areas, Seborrheic Keratosis(es): Stuck-on appearing keratotic papule(s) on the trunk, some  irritated with redness, crusting, edema, and/or partial avulsion, Milia: Multiple 2-3 mm white papules   Examination limited by: Clothing and Patient deferred removal     Back, Abdomen, Groin, Left Leg, Right Wrist (8) Stuck on waxy paps with erythema  Assessment & Plan  (SKAP)   SKIN CANCER SCREENING PERFORMED TODAY.  BENIGN SKIN FINDINGS  - Lentigines  - Seborrheic keratoses  - Hemangiomas  - Reassurance provided regarding the benign appearance of lesions noted on exam today; no treatment is indicated in the absence of symptoms/changes. - Reinforced importance of photoprotective strategies including liberal and frequent sunscreen use of a broad-spectrum SPF 30 or greater, use of protective clothing, and sun avoidance for prevention of cutaneous malignancy and photoaging.  Counseled patient on the importance of regular self-skin monitoring as well as routine clinical skin examinations as scheduled.     Milia  - Discussed the  diagnosis and reassured the pt regarding the benign nature of this condition - Discussed extraction  - After discussion of risks, benefits, and alternative, patient opts for cosmetic removal. Discussed that milia may recur. start tretinoin  0.025% cream in the evening. Educated patient about proper use and potential side effects, including dryness, irritation, sun sensitivity, and transient worsening of acne.  Was sun protection counseling provided?: Yes   Level of service outlined above   Patient instructions (SKPI)   Procedures, orders, diagnosis for this visit:  INFLAMED SEBORRHEIC KERATOSIS (8) Back, Abdomen, Groin, Left Leg, Right Wrist (8) Symptomatic, irritating, patient would like treated. - Destruction of lesion - Back, Abdomen, Groin, Left Leg, Right Wrist (8) Complexity: simple   Destruction method: cryotherapy   Informed consent: discussed and consent obtained   Timeout:  patient name, date of birth, surgical site, and procedure verified Lesion destroyed using liquid nitrogen: Yes   Region frozen until ice ball extended beyond lesion: Yes   Cryo cycles: 1 or 2. Outcome: patient tolerated procedure well with no complications   Post-procedure details: wound care instructions given     Inflamed seborrheic keratosis -     Destruction of lesion  Other orders -     Tretinoin ; Apply topically at bedtime. Apply 2-3 times weekly at night to dry skin after cleaning. Increase frequency up to nightly as tolerated.  Dispense: 20 g; Refill: 5    Return to clinic: Return if symptoms worsen or fail to improve.  I, Emerick Ege, CMA am acting as scribe for Lauraine JAYSON Kanaris, MD.  Documentation: I have reviewed the above documentation for accuracy and completeness, and I  agree with the above.  Lauraine JAYSON Kanaris, MD  "

## 2024-11-22 NOTE — Progress Notes (Signed)
 " [facility] [address] [place], [place] [address]  Internal MEDICINE  Office Visit Note  Patient Name: Pam Khan  [id number]  [id number]  Date of Service: 11/22/2024  Chief Complaint  Patient presents with   Acute Visit   Urinary Tract Infection     HPI Pt is here for a sick visit. -A little vaginal discharge for a few weeks, but then last week started having odor to urine, left side back pain. -No burning, urgency or frequency -Hx of BV and seems similar now, but with urine odor wanted to check this as well -Using a Uro vaginal probiotic, avoiding any scented washes  Current Medication:  Outpatient Encounter Medications as of 11/22/2024  Medication Sig   albuterol  (VENTOLIN  HFA) 108 (90 Base) MCG/ACT inhaler Inhale 2 puffs into the lungs every 6 (six) hours as needed for wheezing or shortness of breath.   Blood Glucose Monitoring Suppl (CONTOUR NEXT MONITOR) w/Device KIT Use as directed   buPROPion  (WELLBUTRIN  XL) 150 MG 24 hr tablet TAKE 1 TABLET BY MOUTH EVERY DAY   CONTOUR NEXT TEST test strip USE AS INSTRUCTED ONCE A DAILY DX R30.0   cyanocobalamin  (VITAMIN B12) 1000 MCG/ML injection Inject 1 mL (1,000 mcg total) into the muscle every 30 (thirty) days.   diltiazem  (MATZIM LA ) 180 MG 24 hr tablet Take 1 tablet (180 mg total) by mouth daily.   escitalopram  (LEXAPRO ) 20 MG tablet Take 1 tablet (20 mg total) by mouth daily.   famciclovir  (FAMVIR ) 500 MG tablet TAKE 1 TABLET BY MOUTH TWICE A DAY   metroNIDAZOLE  (FLAGYL ) 500 MG tablet Take 1 tablet (500 mg total) by mouth 3 (three) times daily for 7 days.   Microlet Lancets MISC Use as directed once a daily for contour next meter  Dx R30.0   omeprazole  (PRILOSEC) 40 MG capsule TAKE ONE CAPSULE BY MOUTH EVERY DAY FOR HEART BURN   phenazopyridine  (PYRIDIUM ) 200 MG tablet Take 1 tablet (200 mg total) by mouth 3 (three) times daily as needed (urinary tract pain/bladder spasms.). Take one tab po bid prn for  bladder spasm   rosuvastatin  (CRESTOR ) 5 MG tablet Take 1 tablet (5 mg total) by mouth daily.   Syringe/Needle, Disp, (SYRINGE 3CC/23GX1) 23G X 1 3 ML MISC Use as directed with B12 injection every 30 days   triamterene -hydrochlorothiazide (MAXZIDE-25) 37.5-25 MG tablet Take 1 tablet by mouth daily.   No facility-administered encounter medications on file as of 11/22/2024.      Medical History: Past Medical History:  Diagnosis Date   Anxiety    Asthma    Barrett esophagus    DDD (degenerative disc disease), lumbar    Family history of adverse reaction to anesthesia    FATHER ALWAYS TROUBLE WAKING UP. WITH NECK SURGERY 2008/ASPIRATED EVENTUALLY DIED PNEUMONIA ETC   GERD (gastroesophageal reflux disease)    Hypertension    Spinal stenosis of lumbar region      Vital Signs: BP 110/65   Pulse 97   Temp 98 F (36.7 C)   Resp 16   Ht 5' 6 (1.676 m)   Wt 140 lb 9.6 oz (63.8 kg)   SpO2 99%   BMI 22.69 kg/m    Review of Systems  Constitutional:  Negative for fatigue and fever.  HENT:  Negative for congestion, mouth sores and postnasal drip.   Respiratory:  Negative for cough.   Cardiovascular:  Negative for chest pain.  Genitourinary:  Positive for flank pain and vaginal discharge.  Negative for dysuria and pelvic pain.  Musculoskeletal:  Positive for back pain.  Psychiatric/Behavioral: Negative.      Physical Exam Vitals and nursing note reviewed.  Constitutional:      General: She is not in acute distress.    Appearance: Normal appearance. She is well-developed and normal weight.  HENT:     Head: Normocephalic and atraumatic.  Eyes:     Extraocular Movements: Extraocular movements intact.  Neck:     Thyroid: No thyromegaly.     Vascular: No JVD.     Trachea: No tracheal deviation.  Cardiovascular:     Rate and Rhythm: Normal rate and regular rhythm.     Heart sounds: Normal heart sounds. No murmur heard.    No friction rub. No gallop.  Pulmonary:     Effort:  Pulmonary effort is normal.  Abdominal:     Tenderness: There is no right CVA tenderness or left CVA tenderness.  Lymphadenopathy:     Upper Body:     Left upper body: No axillary adenopathy.  Skin:    General: Skin is warm and dry.  Neurological:     Mental Status: She is alert and oriented to person, place, and time.  Psychiatric:        Behavior: Behavior normal.        Thought Content: Thought content normal.        Judgment: Judgment normal.       Assessment/Plan: 1. BV (bacterial vaginosis) (Primary) Hx of recurrent BV, will go ahead and treat given symptoms and will also send swab for confirmation. Advised not to drink alcohol on flagyl . Continue probiotic - metroNIDAZOLE  (FLAGYL ) 500 MG tablet; Take 1 tablet (500 mg total) by mouth 3 (three) times daily for 7 days.  Dispense: 21 tablet; Refill: 0  2. Screening for STDs (sexually transmitted diseases) - NuSwab Vaginitis Plus (VG+)  3. Urinary tract infection with hematuria, site unspecified - CULTURE, URINE COMPREHENSIVE  4. Dysuria - POCT Urinalysis Dipstick   General Counseling: Kioni verbalizes understanding of the findings of todays visit and agrees with plan of treatment. I have discussed any further diagnostic evaluation that may be needed or ordered today. We also reviewed her medications today. she has been encouraged to call the office with any questions or concerns that should arise related to todays visit.    Counseling:    Orders Placed This Encounter  Procedures   CULTURE, URINE COMPREHENSIVE   NuSwab Vaginitis Plus (VG+)   POCT Urinalysis Dipstick    Meds ordered this encounter  Medications   metroNIDAZOLE  (FLAGYL ) 500 MG tablet    Sig: Take 1 tablet (500 mg total) by mouth 3 (three) times daily for 7 days.    Dispense:  21 tablet    Refill:  0    Time spent:30 Minutes "

## 2024-11-26 LAB — NUSWAB VAGINITIS PLUS (VG+)
Atopobium vaginae: HIGH {score} — AB
Candida albicans, NAA: NEGATIVE
Candida glabrata, NAA: NEGATIVE

## 2024-11-26 LAB — CULTURE, URINE COMPREHENSIVE

## 2024-11-27 ENCOUNTER — Ambulatory Visit: Payer: Self-pay | Admitting: Physician Assistant

## 2024-11-27 ENCOUNTER — Ambulatory Visit: Admitting: Internal Medicine

## 2024-11-27 ENCOUNTER — Other Ambulatory Visit: Payer: Self-pay

## 2024-11-27 MED ORDER — NITROFURANTOIN MONOHYD MACRO 100 MG PO CAPS: 100.0000 mg | ORAL_CAPSULE | Freq: Two times a day (BID) | ORAL | 0 refills | Status: DC

## 2024-11-27 NOTE — Telephone Encounter (Signed)
-----   Message from Tinnie MARLA Pro sent at 11/27/2024  9:27 AM EST ----- Please let her know that urine culture did confirm UTI and send macrobid  please BID x7 days. Swab showed some indication of BV but lower than past findings

## 2024-11-27 NOTE — Telephone Encounter (Signed)
 Pt notified UA result and sent macrobid  for 7 days

## 2024-12-10 ENCOUNTER — Telehealth: Payer: Self-pay | Admitting: Physician Assistant

## 2024-12-11 ENCOUNTER — Other Ambulatory Visit: Payer: Self-pay | Admitting: Physician Assistant

## 2024-12-11 DIAGNOSIS — N390 Urinary tract infection, site not specified: Secondary | ICD-10-CM

## 2024-12-11 MED ORDER — CIPROFLOXACIN HCL 500 MG PO TABS: 500.0000 mg | ORAL_TABLET | Freq: Two times a day (BID) | ORAL | 0 refills | Status: AC

## 2024-12-11 NOTE — Telephone Encounter (Signed)
Pt notified that we sent med

## 2024-12-13 ENCOUNTER — Encounter: Payer: Self-pay | Admitting: Physician Assistant

## 2024-12-13 ENCOUNTER — Ambulatory Visit: Admitting: Physician Assistant

## 2024-12-13 VITALS — BP 110/68 | HR 81 | Temp 98.0°F | Resp 16 | Ht 66.0 in | Wt 139.6 lb

## 2024-12-13 DIAGNOSIS — E538 Deficiency of other specified B group vitamins: Secondary | ICD-10-CM

## 2024-12-13 DIAGNOSIS — R7303 Prediabetes: Secondary | ICD-10-CM

## 2024-12-13 DIAGNOSIS — I10 Essential (primary) hypertension: Secondary | ICD-10-CM

## 2024-12-13 DIAGNOSIS — K219 Gastro-esophageal reflux disease without esophagitis: Secondary | ICD-10-CM

## 2024-12-13 DIAGNOSIS — F411 Generalized anxiety disorder: Secondary | ICD-10-CM

## 2024-12-13 LAB — POCT GLYCOSYLATED HEMOGLOBIN (HGB A1C): Hemoglobin A1C: 4.8 % (ref 4.0–5.6)

## 2024-12-13 MED ORDER — OMEPRAZOLE 40 MG PO CPDR: DELAYED_RELEASE_CAPSULE | ORAL | 3 refills | Status: AC

## 2024-12-13 MED ORDER — CYANOCOBALAMIN 1000 MCG/ML IJ SOLN
1000.0000 ug | Freq: Once | INTRAMUSCULAR | Status: AC
  Administered 2024-12-13: 1000 ug via INTRAMUSCULAR

## 2024-12-13 MED ORDER — ESCITALOPRAM OXALATE 20 MG PO TABS: 20.0000 mg | ORAL_TABLET | Freq: Every day | ORAL | 1 refills | Status: AC

## 2024-12-13 MED ORDER — TRIAMTERENE-HCTZ 37.5-25 MG PO TABS: 1.0000 | ORAL_TABLET | Freq: Every day | ORAL | 3 refills | Status: AC

## 2024-12-13 MED ORDER — DILTIAZEM HCL ER 180 MG PO TB24: 180.0000 mg | ORAL_TABLET | Freq: Every day | ORAL | 3 refills | Status: AC

## 2024-12-13 NOTE — Progress Notes (Unsigned)
 [name] [address] [place], [place] [address]  Internal MEDICINE  Office Visit Note  Patient Name: Pam Khan  [id number]  [id number]  Date of Service: 12/13/2024  Chief Complaint  Patient presents with   Follow-up   Hypertension   Gastroesophageal Reflux   Medication Refill    HPI Pt is here for routine follow up -recently treated for UTI with macrobid  which was sensitive on C/S, but symptoms persisted and was recently started on cipro  and symptoms improving now -did take zepbound for 3 months, but now has stopped and is maintaining weight loss  Current Medication: Outpatient Encounter Medications as of 12/13/2024  Medication Sig   albuterol  (VENTOLIN  HFA) 108 (90 Base) MCG/ACT inhaler Inhale 2 puffs into the lungs every 6 (six) hours as needed for wheezing or shortness of breath.   Blood Glucose Monitoring Suppl (CONTOUR NEXT MONITOR) w/Device KIT Use as directed   buPROPion  (WELLBUTRIN  XL) 150 MG 24 hr tablet TAKE 1 TABLET BY MOUTH EVERY DAY   ciprofloxacin  (CIPRO ) 500 MG tablet Take 1 tablet (500 mg total) by mouth 2 (two) times daily for 5 days.   CONTOUR NEXT TEST test strip USE AS INSTRUCTED ONCE A DAILY DX R30.0   cyanocobalamin  (VITAMIN B12) 1000 MCG/ML injection Inject 1 mL (1,000 mcg total) into the muscle every 30 (thirty) days.   diltiazem  (MATZIM LA ) 180 MG 24 hr tablet Take 1 tablet (180 mg total) by mouth daily.   escitalopram  (LEXAPRO ) 20 MG tablet Take 1 tablet (20 mg total) by mouth daily.   famciclovir  (FAMVIR ) 500 MG tablet TAKE 1 TABLET BY MOUTH TWICE A DAY   Microlet Lancets MISC Use as directed once a daily for contour next meter  Dx R30.0   omeprazole  (PRILOSEC) 40 MG capsule TAKE ONE CAPSULE BY MOUTH EVERY DAY FOR HEART BURN   phenazopyridine  (PYRIDIUM ) 200 MG tablet Take 1 tablet (200 mg total) by mouth 3 (three) times daily as needed (urinary tract pain/bladder spasms.). Take one tab po bid prn for bladder spasm   rosuvastatin   (CRESTOR ) 5 MG tablet Take 1 tablet (5 mg total) by mouth daily.   Syringe/Needle, Disp, (SYRINGE 3CC/23GX1) 23G X 1 3 ML MISC Use as directed with B12 injection every 30 days   tretinoin  (RETIN-A ) 0.025 % cream Apply topically at bedtime. Apply 2-3 times weekly at night to dry skin after cleaning. Increase frequency up to nightly as tolerated.   triamterene -hydrochlorothiazide (MAXZIDE-25) 37.5-25 MG tablet Take 1 tablet by mouth daily.   [DISCONTINUED] nitrofurantoin , macrocrystal-monohydrate, (MACROBID ) 100 MG capsule Take 1 capsule (100 mg total) by mouth 2 (two) times daily.   No facility-administered encounter medications on file as of 12/13/2024.    Surgical History: Past Surgical History:  Procedure Laterality Date   BACK SURGERY  1985   CESAREAN SECTION  1989   COSMETIC SURGERY  1985   head   CYSTOSCOPY WITH STENT PLACEMENT Left 05/17/2019   Procedure: CYSTOSCOPY WITH STENT PLACEMENT;  Surgeon: Penne Knee, MD;  Location: [facility] ORS;  Service: Urology;  Laterality: Left;   CYSTOSCOPY/URETEROSCOPY/HOLMIUM LASER/STENT PLACEMENT Left 05/25/2019   Procedure: CYSTOSCOPY/URETEROSCOPY/STENT Exchange;  Surgeon: Penne Knee, MD;  Location: [facility] OR[facility];  Service: Urology;  Laterality: Left;   DILATION AND CURETTAGE OF UTERUS  2007    Medical History: Past Medical History:  Diagnosis Date   Anxiety    Asthma    Barrett esophagus    DDD (degenerative disc disease), lumbar    Family history of adverse  reaction to anesthesia    FATHER ALWAYS TROUBLE WAKING UP. WITH NECK SURGERY 2008/ASPIRATED EVENTUALLY DIED PNEUMONIA ETC   GERD (gastroesophageal reflux disease)    Hypertension    Spinal stenosis of lumbar region     Family History: Family History  Problem Relation Age of Onset   Breast cancer Mother 58   Breast cancer Maternal Grandmother 51   Diabetes Father    Heart attack Father     Social History   Socioeconomic History   Marital status: Divorced    Spouse name: Not  on file   Number of children: Not on file   Years of education: Not on file   Highest education level: Not on file  Occupational History   Not on file  Tobacco Use   Smoking status: Never   Smokeless tobacco: Never  Vaping Use   Vaping status: Never Used  Substance and Sexual Activity   Alcohol use: Yes    Comment: occasionally   Drug use: No   Sexual activity: Not on file  Other Topics Concern   Not on file  Social History Narrative   Not on file   Social Drivers of Health   Tobacco Use: Low Risk (12/13/2024)   Patient History    Smoking Tobacco Use: Never    Smokeless Tobacco Use: Never    Passive Exposure: Not on file  Financial Resource Strain: Not on file  Food Insecurity: Not on file  Transportation Needs: Not on file  Physical Activity: Not on file  Stress: Not on file  Social Connections: Not on file  Intimate Partner Violence: Not on file  Depression (PHQ2-9): Low Risk (12/13/2024)   Depression (PHQ2-9)    PHQ-2 Score: 0  Alcohol Screen: Low Risk (04/27/2024)   Alcohol Screen    Last Alcohol Screening Score (AUDIT): 1  Housing: Not on file  Utilities: Not on file  Health Literacy: Not on file      Review of Systems  Vital Signs: BP 110/68   Pulse 81   Temp 98 F (36.7 C)   Resp 16   Ht 5' 6 (1.676 m)   Wt 139 lb 9.6 oz (63.3 kg)   SpO2 99%   BMI 22.53 kg/m    Physical Exam     Assessment/Plan:   General Counseling: [name] verbalizes understanding of the findings of todays visit and agrees with plan of treatment. I have discussed any further diagnostic evaluation that may be needed or ordered today. We also reviewed her medications today. she has been encouraged to call the office with any questions or concerns that should arise related to todays visit.    Orders Placed This Encounter  Procedures   POCT HgB A1C    No orders of the defined types were placed in this encounter.   This patient was seen by Tinnie Pro, PA-C in  collaboration with Dr. Sigrid Bathe as a part of collaborative care agreement.   Total time spent:*** Minutes Time spent includes review of chart, medications, test results, and follow up plan with the patient.      Dr [name] Internal medicine

## 2024-12-24 ENCOUNTER — Ambulatory Visit: Admitting: Internal Medicine

## 2025-04-11 ENCOUNTER — Ambulatory Visit: Admitting: Physician Assistant

## 2025-08-19 ENCOUNTER — Encounter: Admitting: Physician Assistant

## 2025-09-04 ENCOUNTER — Encounter: Admitting: Internal Medicine
